# Patient Record
Sex: Female | Born: 1960
Health system: Southern US, Community
[De-identification: ages and names within clinical notes are randomized; demographics above are authoritative.]

## PROBLEM LIST (undated history)

## (undated) DIAGNOSIS — I10 Essential (primary) hypertension: Secondary | ICD-10-CM

## (undated) DIAGNOSIS — E785 Hyperlipidemia, unspecified: Secondary | ICD-10-CM

## (undated) HISTORY — PX: BREAST BIOPSY: SHX20

## (undated) HISTORY — DX: Essential (primary) hypertension: I10

## (undated) HISTORY — DX: Hyperlipidemia, unspecified: E78.5

## (undated) HISTORY — PX: OOPHORECTOMY: SHX86

## (undated) HISTORY — PX: ABDOMINAL HYSTERECTOMY: SHX81

---

## 1998-09-12 ENCOUNTER — Emergency Department (HOSPITAL_COMMUNITY): Admission: EM | Admit: 1998-09-12 | Discharge: 1998-09-12 | Payer: Self-pay | Admitting: Family Medicine

## 1998-09-14 ENCOUNTER — Emergency Department (HOSPITAL_COMMUNITY): Admission: EM | Admit: 1998-09-14 | Discharge: 1998-09-14 | Payer: Self-pay | Admitting: Emergency Medicine

## 2001-07-31 ENCOUNTER — Ambulatory Visit (HOSPITAL_COMMUNITY): Admission: RE | Admit: 2001-07-31 | Discharge: 2001-07-31 | Payer: Self-pay | Admitting: Family Medicine

## 2002-01-21 ENCOUNTER — Encounter: Admission: RE | Admit: 2002-01-21 | Discharge: 2002-01-21 | Payer: Self-pay

## 2002-05-02 ENCOUNTER — Encounter: Admission: RE | Admit: 2002-05-02 | Discharge: 2002-05-02 | Payer: Self-pay

## 2002-05-16 ENCOUNTER — Encounter (INDEPENDENT_AMBULATORY_CARE_PROVIDER_SITE_OTHER): Payer: Self-pay

## 2002-05-17 ENCOUNTER — Inpatient Hospital Stay (HOSPITAL_COMMUNITY): Admission: RE | Admit: 2002-05-17 | Discharge: 2002-05-18 | Payer: Self-pay

## 2002-08-16 ENCOUNTER — Ambulatory Visit (HOSPITAL_COMMUNITY): Admission: RE | Admit: 2002-08-16 | Discharge: 2002-08-16 | Payer: Self-pay

## 2002-12-06 ENCOUNTER — Ambulatory Visit (HOSPITAL_COMMUNITY): Admission: RE | Admit: 2002-12-06 | Discharge: 2002-12-06 | Payer: Self-pay

## 2004-02-18 ENCOUNTER — Emergency Department (HOSPITAL_COMMUNITY): Admission: EM | Admit: 2004-02-18 | Discharge: 2004-02-18 | Payer: Self-pay | Admitting: Emergency Medicine

## 2004-04-01 ENCOUNTER — Ambulatory Visit (HOSPITAL_COMMUNITY): Admission: RE | Admit: 2004-04-01 | Discharge: 2004-04-01 | Payer: Self-pay | Admitting: Obstetrics and Gynecology

## 2004-12-31 ENCOUNTER — Ambulatory Visit: Payer: Self-pay

## 2005-05-02 ENCOUNTER — Ambulatory Visit (HOSPITAL_COMMUNITY): Admission: RE | Admit: 2005-05-02 | Discharge: 2005-05-02 | Payer: Self-pay | Admitting: Obstetrics and Gynecology

## 2005-10-14 ENCOUNTER — Ambulatory Visit: Payer: Self-pay | Admitting: Internal Medicine

## 2006-01-06 ENCOUNTER — Ambulatory Visit: Payer: Self-pay | Admitting: Internal Medicine

## 2006-03-31 ENCOUNTER — Ambulatory Visit: Payer: Self-pay | Admitting: Internal Medicine

## 2006-04-13 ENCOUNTER — Ambulatory Visit: Payer: Self-pay | Admitting: Internal Medicine

## 2006-05-01 ENCOUNTER — Ambulatory Visit (HOSPITAL_COMMUNITY): Admission: RE | Admit: 2006-05-01 | Discharge: 2006-05-01 | Payer: Self-pay | Admitting: Obstetrics and Gynecology

## 2006-07-25 ENCOUNTER — Ambulatory Visit: Payer: Self-pay | Admitting: Internal Medicine

## 2006-09-08 ENCOUNTER — Ambulatory Visit: Payer: Self-pay | Admitting: Internal Medicine

## 2007-01-09 ENCOUNTER — Ambulatory Visit: Payer: Self-pay | Admitting: Internal Medicine

## 2007-01-09 LAB — CONVERTED CEMR LAB
Cholesterol: 173 mg/dL (ref 0–200)
Creatinine,U: 253.7 mg/dL
Hgb A1c MFr Bld: 10.8 % — ABNORMAL HIGH (ref 4.6–6.0)
LDL Cholesterol: 106 mg/dL — ABNORMAL HIGH (ref 0–99)
Potassium: 4 meq/L (ref 3.5–5.1)
Total CHOL/HDL Ratio: 3.6
Triglycerides: 94 mg/dL (ref 0–149)

## 2007-04-20 ENCOUNTER — Ambulatory Visit: Payer: Self-pay | Admitting: Internal Medicine

## 2007-04-24 ENCOUNTER — Encounter (INDEPENDENT_AMBULATORY_CARE_PROVIDER_SITE_OTHER): Payer: Self-pay | Admitting: *Deleted

## 2007-04-24 LAB — CONVERTED CEMR LAB
Cholesterol: 165 mg/dL (ref 0–200)
Creatinine, Ser: 0.6 mg/dL (ref 0.4–1.2)
Creatinine,U: 183.8 mg/dL
HDL: 44.8 mg/dL (ref >39.0)
Microalb Creat Ratio: 4.4 mg/g (ref 0.0–30.0)
Microalb, Ur: 0.8 mg/dL (ref 0.0–1.9)
Total CHOL/HDL Ratio: 3.7

## 2007-05-01 ENCOUNTER — Telehealth: Payer: Self-pay | Admitting: Internal Medicine

## 2007-05-24 ENCOUNTER — Ambulatory Visit (HOSPITAL_COMMUNITY): Admission: RE | Admit: 2007-05-24 | Discharge: 2007-05-24 | Payer: Self-pay | Admitting: Obstetrics and Gynecology

## 2007-05-24 ENCOUNTER — Encounter: Payer: Self-pay | Admitting: Internal Medicine

## 2008-02-27 ENCOUNTER — Ambulatory Visit: Payer: Self-pay | Admitting: Internal Medicine

## 2008-02-27 DIAGNOSIS — E785 Hyperlipidemia, unspecified: Secondary | ICD-10-CM | POA: Insufficient documentation

## 2008-02-27 DIAGNOSIS — E1165 Type 2 diabetes mellitus with hyperglycemia: Secondary | ICD-10-CM | POA: Insufficient documentation

## 2008-02-27 DIAGNOSIS — I10 Essential (primary) hypertension: Secondary | ICD-10-CM | POA: Insufficient documentation

## 2008-02-27 LAB — CONVERTED CEMR LAB: Cholesterol, target level: 200 mg/dL

## 2008-03-15 LAB — CONVERTED CEMR LAB
Creatinine, Ser: 0.5 mg/dL (ref 0.4–1.2)
Creatinine,U: 119.8 mg/dL
Microalb Creat Ratio: 25 mg/g (ref 0.0–30.0)
Microalb, Ur: 3 mg/dL — ABNORMAL HIGH (ref 0.0–1.9)

## 2008-03-17 ENCOUNTER — Encounter (INDEPENDENT_AMBULATORY_CARE_PROVIDER_SITE_OTHER): Payer: Self-pay | Admitting: *Deleted

## 2008-04-29 ENCOUNTER — Encounter: Payer: Self-pay | Admitting: Internal Medicine

## 2008-05-27 ENCOUNTER — Ambulatory Visit (HOSPITAL_COMMUNITY): Admission: RE | Admit: 2008-05-27 | Discharge: 2008-05-27 | Payer: Self-pay | Admitting: Obstetrics and Gynecology

## 2008-07-24 ENCOUNTER — Ambulatory Visit: Payer: Self-pay | Admitting: Internal Medicine

## 2008-07-27 LAB — CONVERTED CEMR LAB
ALT: 12 units/L (ref 0–35)
Alkaline Phosphatase: 56 units/L (ref 39–117)
Cholesterol: 147 mg/dL (ref 0–200)
HDL: 45 mg/dL (ref >39.0)
Microalb Creat Ratio: 4.6 mg/g (ref 0.0–30.0)
Microalb, Ur: 1 mg/dL (ref 0.0–1.9)
Total CHOL/HDL Ratio: 3.3

## 2008-07-28 ENCOUNTER — Encounter (INDEPENDENT_AMBULATORY_CARE_PROVIDER_SITE_OTHER): Payer: Self-pay | Admitting: *Deleted

## 2008-11-26 ENCOUNTER — Ambulatory Visit: Payer: Self-pay | Admitting: Internal Medicine

## 2008-12-01 ENCOUNTER — Ambulatory Visit: Payer: Self-pay | Admitting: Internal Medicine

## 2008-12-04 ENCOUNTER — Telehealth (INDEPENDENT_AMBULATORY_CARE_PROVIDER_SITE_OTHER): Payer: Self-pay | Admitting: *Deleted

## 2009-05-05 ENCOUNTER — Telehealth (INDEPENDENT_AMBULATORY_CARE_PROVIDER_SITE_OTHER): Payer: Self-pay | Admitting: *Deleted

## 2009-05-06 ENCOUNTER — Telehealth (INDEPENDENT_AMBULATORY_CARE_PROVIDER_SITE_OTHER): Payer: Self-pay | Admitting: *Deleted

## 2009-05-25 ENCOUNTER — Ambulatory Visit: Payer: Self-pay | Admitting: Internal Medicine

## 2009-05-31 LAB — CONVERTED CEMR LAB
BUN: 12 mg/dL (ref 6–23)
Hemoglobin: 12.5 g/dL (ref 12.0–15.0)
Hgb A1c MFr Bld: 10 % — ABNORMAL HIGH (ref 4.6–6.5)
Lymphocytes Relative: 39.3 % (ref 12.0–46.0)
Lymphs Abs: 2.1 10*3/uL (ref 0.7–4.0)
MCHC: 32.9 g/dL (ref 30.0–36.0)
MCV: 90.1 fL (ref 78.0–100.0)
Neutro Abs: 2.7 10*3/uL (ref 1.4–7.7)
Neutrophils Relative %: 51.1 % (ref 43.0–77.0)
RBC: 4.2 M/uL (ref 3.87–5.11)

## 2009-06-01 ENCOUNTER — Ambulatory Visit: Payer: Self-pay | Admitting: Internal Medicine

## 2009-06-11 ENCOUNTER — Ambulatory Visit (HOSPITAL_COMMUNITY): Admission: RE | Admit: 2009-06-11 | Discharge: 2009-06-11 | Payer: Self-pay | Admitting: Obstetrics and Gynecology

## 2009-08-17 ENCOUNTER — Ambulatory Visit: Payer: Self-pay | Admitting: Family Medicine

## 2009-09-07 ENCOUNTER — Ambulatory Visit: Payer: Self-pay | Admitting: Internal Medicine

## 2009-09-08 ENCOUNTER — Telehealth (INDEPENDENT_AMBULATORY_CARE_PROVIDER_SITE_OTHER): Payer: Self-pay | Admitting: *Deleted

## 2009-09-08 LAB — CONVERTED CEMR LAB
HDL: 38.2 mg/dL — ABNORMAL LOW (ref >39.00)
Hgb A1c MFr Bld: 10.5 % — ABNORMAL HIGH (ref 4.6–6.5)
LDL Cholesterol: 86 mg/dL (ref 0–99)
Total CHOL/HDL Ratio: 4
Triglycerides: 118 mg/dL (ref 0.0–149.0)
VLDL: 23.6 mg/dL (ref 0.0–40.0)

## 2009-09-09 ENCOUNTER — Ambulatory Visit: Payer: Self-pay | Admitting: Internal Medicine

## 2009-10-05 ENCOUNTER — Ambulatory Visit: Payer: Self-pay | Admitting: Internal Medicine

## 2009-10-07 ENCOUNTER — Telehealth (INDEPENDENT_AMBULATORY_CARE_PROVIDER_SITE_OTHER): Payer: Self-pay | Admitting: *Deleted

## 2009-10-07 ENCOUNTER — Encounter: Payer: Self-pay | Admitting: Internal Medicine

## 2009-10-19 ENCOUNTER — Telehealth (INDEPENDENT_AMBULATORY_CARE_PROVIDER_SITE_OTHER): Payer: Self-pay | Admitting: *Deleted

## 2010-03-22 ENCOUNTER — Telehealth (INDEPENDENT_AMBULATORY_CARE_PROVIDER_SITE_OTHER): Payer: Self-pay | Admitting: *Deleted

## 2010-06-08 ENCOUNTER — Encounter: Payer: Self-pay | Admitting: Internal Medicine

## 2010-06-11 ENCOUNTER — Encounter: Payer: Self-pay | Admitting: Internal Medicine

## 2010-06-15 ENCOUNTER — Ambulatory Visit (HOSPITAL_BASED_OUTPATIENT_CLINIC_OR_DEPARTMENT_OTHER): Admission: RE | Admit: 2010-06-15 | Discharge: 2010-06-15 | Payer: Self-pay | Admitting: Obstetrics and Gynecology

## 2010-06-15 ENCOUNTER — Ambulatory Visit: Payer: Self-pay | Admitting: Radiology

## 2010-06-15 ENCOUNTER — Telehealth (INDEPENDENT_AMBULATORY_CARE_PROVIDER_SITE_OTHER): Payer: Self-pay | Admitting: *Deleted

## 2010-07-02 ENCOUNTER — Ambulatory Visit: Payer: Self-pay | Admitting: Internal Medicine

## 2010-07-02 LAB — CONVERTED CEMR LAB
Cholesterol: 157 mg/dL (ref 0–200)
Creatinine, Ser: 0.7 mg/dL (ref 0.4–1.2)
Hgb A1c MFr Bld: 10.1 % — ABNORMAL HIGH (ref 4.6–6.5)
LDL Cholesterol: 91 mg/dL (ref 0–99)
Microalb Creat Ratio: 0.9 mg/g (ref 0.0–30.0)
Microalb, Ur: 1.2 mg/dL (ref 0.0–1.9)
Potassium: 3.9 meq/L (ref 3.5–5.1)
Total Protein: 6.3 g/dL (ref 6.0–8.3)
VLDL: 23 mg/dL (ref 0.0–40.0)

## 2010-07-07 ENCOUNTER — Ambulatory Visit: Payer: Self-pay | Admitting: Internal Medicine

## 2010-07-08 ENCOUNTER — Encounter (INDEPENDENT_AMBULATORY_CARE_PROVIDER_SITE_OTHER): Payer: Self-pay | Admitting: *Deleted

## 2010-07-28 ENCOUNTER — Telehealth (INDEPENDENT_AMBULATORY_CARE_PROVIDER_SITE_OTHER): Payer: Self-pay | Admitting: *Deleted

## 2010-08-12 ENCOUNTER — Encounter (INDEPENDENT_AMBULATORY_CARE_PROVIDER_SITE_OTHER): Payer: Self-pay | Admitting: *Deleted

## 2010-08-20 ENCOUNTER — Telehealth: Payer: Self-pay | Admitting: Internal Medicine

## 2010-08-25 ENCOUNTER — Telehealth: Payer: Self-pay | Admitting: Internal Medicine

## 2010-09-01 ENCOUNTER — Emergency Department (HOSPITAL_BASED_OUTPATIENT_CLINIC_OR_DEPARTMENT_OTHER): Admission: EM | Admit: 2010-09-01 | Discharge: 2010-09-01 | Payer: Self-pay | Admitting: Emergency Medicine

## 2010-09-01 ENCOUNTER — Ambulatory Visit: Payer: Self-pay | Admitting: Diagnostic Radiology

## 2010-09-15 ENCOUNTER — Telehealth: Payer: Self-pay | Admitting: Internal Medicine

## 2010-09-16 ENCOUNTER — Encounter: Payer: Self-pay | Admitting: Internal Medicine

## 2010-09-16 ENCOUNTER — Telehealth: Payer: Self-pay | Admitting: Internal Medicine

## 2010-11-10 ENCOUNTER — Ambulatory Visit
Admission: RE | Admit: 2010-11-10 | Discharge: 2010-11-10 | Payer: Self-pay | Source: Home / Self Care | Attending: Internal Medicine | Admitting: Internal Medicine

## 2010-11-10 DIAGNOSIS — J019 Acute sinusitis, unspecified: Secondary | ICD-10-CM | POA: Insufficient documentation

## 2010-11-20 ENCOUNTER — Encounter: Payer: Self-pay | Admitting: Obstetrics and Gynecology

## 2010-11-21 ENCOUNTER — Encounter: Payer: Self-pay | Admitting: Obstetrics and Gynecology

## 2010-11-29 ENCOUNTER — Telehealth (INDEPENDENT_AMBULATORY_CARE_PROVIDER_SITE_OTHER): Payer: Self-pay | Admitting: *Deleted

## 2010-11-30 NOTE — Consult Note (Signed)
Summary: [facility]  [facility]   Imported By: Lennie Odor 06/10/2010 11:20:23  _____________________________________________________________________  External Attachment:    Type:   Image     Comment:   External Document

## 2010-11-30 NOTE — Progress Notes (Signed)
----   Converted from flag ---- ---- 06/15/2010 10:41 AM, Okey Regal Spring wrote: mailed letter to patient to schedule lab & ov  ---- 06/14/2010 10:20 AM, Okey Regal Spring wrote:   ---- 06/14/2010 10:20 AM, Okey Regal Spring wrote: lmom to schedule lab & follow up ov  ---- 06/13/2010 9:31 AM, Marga Melnick MD wrote: labs were due in 12/2009. She needs fasting lipids, hepatic panel, A1c , BUN,creat, K+,urine microalbumin with OV 5-7 days later (250.02, 995.20,272.4) ------------------------------

## 2010-11-30 NOTE — Progress Notes (Signed)
Summary: Insulin assistance  Phone Note Call from Patient Call back at Home Phone [phone] Call back at Work Phone [phone]   Summary of Call: I received a vm that the patient was returning our call. Left message on voicemail at both numbers above to call back to office. Lucious Groves CMA  September 16, 2010 4:51 PM  (?How much is the patient taking?)  Left message on machine at home to call back to office. I was able to reach the patient at work and she is taking 22units once daily. Lucious Groves CMA  September 17, 2010 1:32 PM

## 2010-11-30 NOTE — Progress Notes (Signed)
Summary: Pt assistance paperwork  Phone Note Call from Patient   Summary of Call: Patient called noting that she has patient assistance paperwork for Lantus.  She was advised to drop off the paperwork, and we will call her when it is ready. Initial call taken by: Lucious Groves CMA,  September 15, 2010 11:26 AM

## 2010-11-30 NOTE — Progress Notes (Signed)
Summary: pt meeds lab - mailed letter  ---- Converted from flag ---- ---- 06/15/2010 10:41 AM, Okey Regal Spring wrote: mailed letter to patient to schedule lab & ov  ---- 06/14/2010 10:20 AM, Okey Regal Spring wrote:   ---- 06/14/2010 10:20 AM, Okey Regal Spring wrote: lmom to schedule lab & follow up ov  ---- 06/13/2010 9:31 AM, Marga Melnick MD wrote: labs were due in 12/2009. She needs fasting lipids, hepatic panel, A1c , BUN,creat, K+,urine microalbumin with OV 5-7 days later (250.02, 995.20,272.4) ------------------------------

## 2010-11-30 NOTE — Medication Information (Signed)
Summary: Patient Assistance Form/[employer]  Patient Assistance Form/[employer]   Imported By: Lanelle Bal 09/27/2010 13:13:34  _____________________________________________________________________  External Attachment:    Type:   Image     Comment:   External Document

## 2010-11-30 NOTE — Letter (Signed)
Summary: Pre Visit Letter Revised  [facility]  [address] [place], [place] [address]   Phone: [phone]  Fax: [phone]        07/08/2010 MRN: [id number]   Kathleen Acevedo [address] [place], [place]  [address]            Welcome to the Gastroenterology Division at [facility].    You are scheduled to see a nurse for your pre-procedure visit on August 12, 2010 at 9:00am on the 3rd floor at [facility], [address]  We ask that you try to arrive at our office 15 minutes prior to your appointment time to allow for check-in.  Please take a minute to review the attached form.  If you answer "Yes" to one or more of the questions on the first page, we ask that you call the person listed at your earliest opportunity.  If you answer "No" to all of the questions, please complete the rest of the form and bring it to your appointment.    Your nurse visit will consist of discussing your medical and surgical history, your immediate family medical history, and your medications.   If you are unable to list all of your medications on the form, please bring the medication bottles to your appointment and we will list them.  We will need to be aware of both prescribed and over the counter drugs.  We will need to know exact dosage information as well.    Please be prepared to read and sign documents such as consent forms, a financial agreement, and acknowledgement forms.  If necessary, and with your consent, a friend or relative is welcome to sit-in on the nurse visit with you.  Please bring your insurance card so that we may make a copy of it.  If your insurance requires a referral to see a specialist, please bring your referral form from your primary care physician.  No co-pay is required for this nurse visit.     If you cannot keep your appointment, please call [phone] to cancel or reschedule prior to your appointment date.  This allows [place] the opportunity to  schedule an appointment for another patient in need of care.    Thank you for choosing [facility] for your medical needs.  We appreciate the opportunity to care for you.  Please visit [place] at our website  to learn more about our practice.  Sincerely, The Gastroenterology Division

## 2010-11-30 NOTE — Progress Notes (Signed)
Summary: Janumet  Phone Note Call from Patient Call back at Work Phone [phone]   Details for Reason: [facility] [address] [place] Summary of Call: Patient called the office to let MD know that her husband is working now and she can now afford to get the Janumet. Patient would like it sent to the [facility] above.   Is this ok? Please advise. Initial call taken by: Lucious Groves CMA,  August 25, 2010 3:49 PM  Follow-up for Phone Call        Janumet in place of Metformin Follow-up by: Marga Melnick MD,  August 25, 2010 5:02 PM  Additional Follow-up for Phone Call Additional follow up Details #1::        left pt detail message rx sent to pharmacy and to call with any other concerns or questions......Marland KitchenFelecia Deloach CMA  August 26, 2010 8:33 AM     New/Updated Medications: JANUMET 50-1000 MG TABS (SITAGLIPTIN-METFORMIN HCL) 1 two times a day with 2 largest meals( stop Metformin) Prescriptions: JANUMET 50-1000 MG TABS (SITAGLIPTIN-METFORMIN HCL) 1 two times a day with 2 largest meals( stop Metformin)  #60 x 2   Entered and Authorized by:   Marga Melnick MD   Signed by:   Marga Melnick MD on 08/25/2010   Method used:   Electronically to        [facility] # [phone]* (retail)       [address]       [place], [place]  [address]       Ph: [phone]       Fax: [phone]   RxID:   [id number]

## 2010-11-30 NOTE — Progress Notes (Signed)
Summary: Janumet/Glucophage  Phone Note Call from Patient Call back at Home Phone [phone] Call back at Work Phone [phone]   Summary of Call: Patient called the office stating that she needs to back on Glucophage b/c she cannot afford the Janumet. Glucophage was $10 and Janument is $35 and she cannot tell a real difference, but states she likes the Glucophage better. Is it ok to send prescription for Glucophage? Please adivse. Initial call taken by: Lucious Groves CMA,  August 20, 2010 2:36 PM  Follow-up for Phone Call        A1c was 10.1% , a 100% increased heart attack or stroke risk. Glucophage wasn't getting her to goal of < 7%.  If she doesn't want to try Janumet, titrate Lantus by 3 units / day every week until fasting glucose is averaging  150-160. At that point recheck A1c (250.02) Follow-up by: Marga Melnick MD,  August 20, 2010 3:00 PM  Additional Follow-up for Phone Call Additional follow up Details #1::        Patient notified. Additional Follow-up by: Lucious Groves CMA,  August 20, 2010 4:02 PM

## 2010-11-30 NOTE — Consult Note (Signed)
Summary: [facility]  [facility]   Imported By: Lanelle Bal 06/21/2010 10:00:21  _____________________________________________________________________  External Attachment:    Type:   Image     Comment:   External Document

## 2010-11-30 NOTE — Letter (Signed)
Summary: Pre Visit No Show Letter  [facility]  [address] [place], [place] [address]   Phone: [phone]  Fax: [phone]        August 12, 2010 MRN: [id number]    ROSCHELLE CALANDRA [address] [place], [place]  [address]    Dear Ms. Carraway,   We have been unable to reach you by phone concerning the pre-procedure visit that you missed on  !0/13/2011. For this reason,your procedure scheduled on Thursday 08/26/2010 has been cancelled. Our scheduling staff will gladly assist you with rescheduling your appointments at a more convenient time. Please call our office at [phone] between the hours of 8:00am and 5:00pm, press option #2 to reach an appointment scheduler. Please consider updating your contact numbers at this time so that we can reach you by phone in the future with schedule changes or results.    Thank you,    Ezra Sites RN [facility]

## 2010-11-30 NOTE — Medication Information (Signed)
Summary: [facility]  [facility]   Imported By: Lanelle Bal 07/19/2010 10:29:23  _____________________________________________________________________  External Attachment:    Type:   Image     Comment:   External Document  Appended Document: [facility] Refaxed to alternate fax number [phone], we received paperwork from [facility] for a 4th time which we have faxed back each time

## 2010-11-30 NOTE — Medication Information (Signed)
Summary: [facility]  [facility]   Imported By: Lanelle Bal 06/21/2010 09:24:32  _____________________________________________________________________  External Attachment:    Type:   Image     Comment:   External Document

## 2010-11-30 NOTE — Progress Notes (Signed)
Summary: QUESTION --SUBSTITUTE FOR LANTUS OPTICLIK??  Phone Note Refill Request Message from:  Fax from Pharmacy on July 28, 2010 2:30 PM  Refills Requested: Medication #1:  LANTUS OPTICLIK 100 UNIT/ML SOLN use 18 units every morning; increase by 3 units  daily each week until fasting glucose is consistently < 140 [facility] [place],     FAX [phone]   ****NOTE---HANDWRITTEN NOTE STATES:  "DOESNT COME IN THIS BRAND--DO YOU WANT SOLOSTAR? "  Initial call taken by: Jerolyn Shin,  July 28, 2010 2:37 PM    New/Updated Medications: LANTUS SOLOSTAR 100 UNIT/ML SOLN (INSULIN GLARGINE) 18 units every morning; increase by 3 units  daily each week until fasting glucose is consistently < 140 Prescriptions: LANTUS SOLOSTAR 100 UNIT/ML SOLN (INSULIN GLARGINE) 18 units every morning; increase by 3 units  daily each week until fasting glucose is consistently < 140  #54mo x 5   Entered by:   Shonna Chock CMA   Authorized by:   Marga Melnick MD   Signed by:   Shonna Chock CMA on 07/28/2010   Method used:   Electronically to        [facility] # [phone]* (retail)       [address]       [place], [place]  [address]       Ph: [phone]       Fax: [phone]   RxID:   [id number]

## 2010-11-30 NOTE — Assessment & Plan Note (Signed)
Summary: rto review lab/cbs   Vital Signs:  Patient profile:   50 year old female Weight:      231.8 pounds BMI:     35.37 Pulse rate:   64 / minute Resp:     15 per minute BP sitting:   140 / 84  (left arm) Cuff size:   large  Vitals Entered By: Shonna Chock CMA (July 07, 2010 8:09 AM) CC: Follow-up visit: Discuss labs (copy given), Type 2 diabetes mellitus follow-up   Primary Care Provider:  Alfonse Flavors  CC:  Follow-up visit: Discuss labs (copy given) and Type 2 diabetes mellitus follow-up.  History of Present Illness: Hyperlipidemia Follow-Up      This is a 50 year old woman who presents for Hyperlipidemia follow-up.  The patient denies the following symptoms: chest pain/pressure, exercise intolerance, dypsnea, palpitations, syncope, and pedal edema.  Dietary compliance has been good.  The patient reports exercising 4-5 X per week as water aerobics or machines @ gym.  Lipids are @ goal w/o meds. Hypertension Follow-Up      The patient also presents for Hypertension follow-up.  The patient denies lightheadedness, urinary frequency, headaches, edema, rash, and fatigue.  Compliance with medications (by patient report) has been near 100%.  Adjunctive measures currently used by the patient include modified  salt restriction.  BP @ Podiatrist this month was 130/70. Type 2 Diabetes Mellitus Follow-Up      The patient is also here for Type 2 diabetes mellitus follow-up.  The patient reports weight loss of 18# with W W 's, but denies polyuria, polydipsia, blurred vision, and self managed hypoglycemia.  The patient denies the following symptoms: poor wound healing, vision loss, and foot ulcer.  Since the last visit the patient reports good dietary compliance and exercising regularly(see above).  The patient has been measuring capillary blood glucose before breakfast ( 150 as average) and  2 hrs after dinner (180-240).  Since the last visit, the patient reports having had no eye care and foot care  by a podiatrist.  A1c 10.1 % which means average glucose of 240 with > 100% increased risk. Januvia D/Ced due to drop in glucose & cost ( $35/ month). Glucometer is > 5 yrs old.  Current Medications (verified): 1)  Lantus Opticlik 100 Unit/ml Soln (Insulin Glargine) .... Use 18 Units Every Morning 2)  Accu-Chek Aviva   Strp (Glucose Blood) .... Tests 2-3 Times Daily Patient On Insulin 3)  Lisinopril 20 Mg  Tabs (Lisinopril) .... Take One Tablet Daily. 4)  Metformin Hcl 1000 Mg  Tabs (Metformin Hcl) .... Take One Tablet Twice Daily With Two Largest Meals of The Day. ****appointment Due**** 5)  Iron 325 (65 Fe) Mg Tabs (Ferrous Sulfate) .[name].. 1 By Mouth 2 X Weekly 6)  Multivitamins  Tabs (Multiple Vitamin) .[name].. 1 By Mouth Once Daily  Allergies (verified): No Known Drug Allergies  Review of Systems GI:  Denies abdominal pain, bloody stools, dark tarry stools, and indigestion; Colonoscopy due; SOC discussed.  Physical Exam  General:  well-nourished; alert,appropriate and cooperative throughout examination Neck:  No deformities, masses, or tenderness noted. Lungs:  Normal respiratory effort, chest expands symmetrically. Lungs are clear to auscultation, no crackles or wheezes. Heart:  normal rate, regular rhythm, no gallop, no rub, no JVD, no HJR, and grade 1/6 systolic murmur.   Abdomen:  Bowel sounds positive,abdomen soft and non-tender without masses, organomegaly or hernias noted. Pulses:  R and L carotid,radial,dorsalis pedis and posterior tibial pulses are full  and equal bilaterally Extremities:  No clubbing, cyanosis, edema.Mild nail  changes .Some Hammer Toe deformity & callus of 5th toes Neurologic:  alert & oriented X3 and DTRs symmetrical and normal.   Skin:  Intact without suspicious lesions or rashes   Impression & Recommendations:  Problem # 1:  DIABETES MELLITUS, TYPE II, UNCONTROLLED (ICD-250.02)  The following medications were removed from the medication list:    Januvia  100 Mg Tabs (Sitagliptin phosphate) .[name]... 1 once daily Her updated medication list for this problem includes:    Lantus Opticlik 100 Unit/ml Soln (Insulin glargine) ..... Use 18 units every morning; increase by 3 units  daily each week until fasting glucose is consistently < 140    Lisinopril 20 Mg Tabs (Lisinopril) .[name]... Take one tablet daily.    Metformin Hcl 1000 Mg Tabs (Metformin hcl) .[name]... Take one tablet twice daily with two largest meals of the day. change to janumet once completed    Janumet 50-1000 Mg Tabs (Sitagliptin-metformin hcl) .[name]... 1 two times a day with 2 largest meals  Problem # 2:  HYPERTENSION (ICD-401.9) Controlled by  Her updated medication list for this problem includes:    Lisinopril 20 Mg Tabs (Lisinopril) .[name]... Take one tablet daily.  Problem # 3:  HYPERLIPIDEMIA (ICD-272.4) Lipids @ goal  Problem # 4:  SCREENING, COLON CANCER (ICD-V76.51)  Orders: Gastroenterology Referral (GI)  Complete Medication List: 1)  Lantus Opticlik 100 Unit/ml Soln (Insulin glargine) .... Use 18 units every morning; increase by 3 units  daily each week until fasting glucose is consistently < 140 2)  Onetouch Delica Lancets Misc (Lancets) .... Check blooddsugar two times a day 3)  Lisinopril 20 Mg Tabs (Lisinopril) .... Take one tablet daily. 4)  Metformin Hcl 1000 Mg Tabs (Metformin hcl) .... Take one tablet twice daily with two largest meals of the day. change to janumet once completed 5)  Iron 325 (65 Fe) Mg Tabs (Ferrous sulfate) .[name].. 1 by mouth 2 x weekly 6)  Multivitamins Tabs (Multiple vitamin) .[name].. 1 by mouth once daily 7)  Janumet 50-1000 Mg Tabs (Sitagliptin-metformin hcl) .[name].. 1 two times a day with 2 largest meals 8)  Onetouch Ultra Blue Strp (Glucose blood) .... Check bloodsugars two times a day  Patient Instructions: 1)  Please schedule a follow-up appointment in 3 months. 2)  HbgA1C prior to visit, ICD-9:250.02. Consume < 30 grams of HFCS sugar / day. 3)  Check your  blood sugars regularly.Fasting goal = 90-140 & 2 hrs after a meal < 180, ideally < 160. 4)  Check your Blood Pressure regularly.Your goal = AVERAGE < 135/85. Prescriptions: ONETOUCH ULTRA BLUE  STRP (GLUCOSE BLOOD) check bloodsugars two times a day  #200 x 3   Entered by:   Shonna Chock CMA   Authorized by:   Marga Melnick MD   Signed by:   Shonna Chock CMA on 07/07/2010   Method used:   Electronically to        [facility] # [phone]* (retail)       [address]       [place], [place]  [address]       Ph: [phone]       Fax: [phone]   RxID:   [id number] Dola Argyle LANCETS  MISC (LANCETS) check blooddsugar two times a day  #200 x 3   Entered by:   Shonna Chock CMA   Authorized by:  Marga Melnick MD   Signed by:   Shonna Chock CMA on 07/07/2010   Method used:   Electronically to        [facility] # [phone]* (retail)       [address]       [place], [place]  [address]       Ph: [phone]       Fax: [phone]   RxID:   [id number] LISINOPRIL 20 MG  TABS (LISINOPRIL) Take one tablet daily.  #90 Each x 1   Entered and Authorized by:   Marga Melnick MD   Signed by:   Marga Melnick MD on 07/07/2010   Method used:   Print then Give to Patient   RxID:   [id number] LANTUS OPTICLIK 100 UNIT/ML SOLN (INSULIN GLARGINE) use 18 units every morning; increase by 3 units  daily each week until fasting glucose is consistently < 140  #1 month x 5   Entered and Authorized by:   Marga Melnick MD   Signed by:   Marga Melnick MD on 07/07/2010   Method used:   Print then Give to Patient   RxID:   [id number] JANUMET 50-1000 MG TABS (SITAGLIPTIN-METFORMIN HCL) 1 two times a day with 2 largest meals  #60 x 0   Entered and Authorized by:   Marga Melnick MD   Signed by:   Marga Melnick MD on 07/07/2010   Method used:   Print then Give to Patient   RxID:   [id number]

## 2010-11-30 NOTE — Progress Notes (Signed)
Summary: Refill Request  Phone Note Refill Request Message from:  Pharmacy on Mar 22, 2010 1:38 PM  Refills Requested: Medication #1:  METFORMIN HCL 1000 MG  TABS Take one tablet twice daily with two largest meals of the day.   Dosage confirmed as above?Dosage Confirmed   Supply Requested: 1 month   Last Refilled: 03/09/2010 [facility] [address]  Initial call taken by: Harold Barban,  Mar 22, 2010 1:39 PM    New/Updated Medications: METFORMIN HCL 1000 MG  TABS (METFORMIN HCL) Take one tablet twice daily with two largest meals of the day. APPOINTMENT DUE Prescriptions: METFORMIN HCL 1000 MG  TABS (METFORMIN HCL) Take one tablet twice daily with two largest meals of the day. APPOINTMENT DUE  #180 x 0   Entered by:   Shonna Chock   Authorized by:   Marga Melnick MD   Signed by:   Shonna Chock on 03/22/2010   Method used:   Electronically to        [facility] # [phone]* (retail)       [address]       [place], [place]  [address]       Ph: [phone]       Fax: [phone]   RxID:   [id number]  3)  HbgA1C prior to visit, ICD-9:250.02 4)  Urine Microalbumin prior to visit,  5)  BUn,creat,K+ 401.9

## 2010-12-02 NOTE — Assessment & Plan Note (Signed)
Summary: SORE THROAT, COUGH/RH.......[name]   Vital Signs:  Patient profile:   50 year old female Weight:      248 pounds BMI:     37.84 Temp:     97.8 degrees F oral BP sitting:   150 / 90  (left arm)  Vitals Entered By: Doristine Devoid CMA (November 10, 2010 11:46 AM) CC: sinus congestion and cough   History of Present Illness: 50 yo woman here today for ? sinus infxn.  + facial pain and pressure, nasal congestion, drainage, cough.  sxs started 2 weeks ago and then initially improved before worsening.  no fevers.  no ear pain.  + sick contacts.  Current Medications (verified): 1)  Lantus Solostar 100 Unit/ml Soln (Insulin Glargine) .[name].. 18 Units Every Morning; Increase By 3 Units  Daily Each Week Until Fasting Glucose Is Consistently < 140 2)  Onetouch Delica Lancets  Misc (Lancets) .... Check Blooddsugar Two Times A Day 3)  Lisinopril 20 Mg  Tabs (Lisinopril) .... Take One Tablet Daily. 4)  Iron 325 (65 Fe) Mg Tabs (Ferrous Sulfate) .[name].. 1 By Mouth 2 X Weekly 5)  Multivitamins  Tabs (Multiple Vitamin) .[name].. 1 By Mouth Once Daily 6)  Onetouch Ultra Blue  Strp (Glucose Blood) .... Check Bloodsugars Two Times A Day 7)  Janumet 50-1000 Mg Tabs (Sitagliptin-Metformin Hcl) .[name].. 1 Two Times A Day With 2 Largest Meals( Stop Metformin)**appointment Due**  Allergies (verified): No Known Drug Allergies  Review of Systems      See HPI  Physical Exam  General:  well-nourished; alert,appropriate and cooperative throughout examination Head:  NCAT, + TTP over frontal and maxillary sinuses Eyes:  no injxn or inflammation Ears:  External ear exam shows no significant lesions or deformities.  Otoscopic examination reveals clear canals, tympanic membranes are intact bilaterally without bulging, retraction, inflammation or discharge. Hearing is grossly normal bilaterally. Nose:  + congestion Mouth:  Oral mucosa and oropharynx without lesions or exudates.  Neck:  ant LAD Lungs:  Normal respiratory  effort, chest expands symmetrically. Lungs are clear to auscultation, no crackles or wheezes. Heart:  reg S1/S2   Impression & Recommendations:  Problem # 1:  SINUSITIS - ACUTE-NOS (ICD-461.9) Assessment New start Augmentin given DM.  cough meds as needed.  reviewed supportive care and red flags that should prompt return.  Pt expresses understanding and is in agreement w/ this plan. Her updated medication list for this problem includes:    Augmentin 875-125 Mg Tabs (Amoxicillin-pot clavulanate) .[name]... 1 by mouth 2 times daily x10 days.  take w/ food.    Tessalon 200 Mg Caps (Benzonatate) .[name]... Take one capsule by mouth three times a day as needed for cough    Cheratussin Ac 100-10 Mg/72ml Syrp (Guaifenesin-codeine) .[name]... 1-2 tsps q4-6 as needed for cough.  will cause drowsiness  Complete Medication List: 1)  Lantus Solostar 100 Unit/ml Soln (Insulin glargine) .[name].. 18 units every morning; increase by 3 units  daily each week until fasting glucose is consistently < 140 2)  Onetouch Delica Lancets Misc (Lancets) .... Check blooddsugar two times a day 3)  Lisinopril 20 Mg Tabs (Lisinopril) .... Take one tablet daily. 4)  Iron 325 (65 Fe) Mg Tabs (Ferrous sulfate) .[name].. 1 by mouth 2 x weekly 5)  Multivitamins Tabs (Multiple vitamin) .[name].. 1 by mouth once daily 6)  Onetouch Ultra Blue Strp (Glucose blood) .... Check bloodsugars two times a day 7)  Janumet 50-1000 Mg Tabs (Sitagliptin-metformin hcl) .[name].. 1 two times a day  with 2 largest meals( stop metformin)**appointment due** 8)  Augmentin 875-125 Mg Tabs (Amoxicillin-pot clavulanate) .[name].. 1 by mouth 2 times daily x10 days.  take w/ food. 9)  Tessalon 200 Mg Caps (Benzonatate) .... Take one capsule by mouth three times a day as needed for cough 10)  Cheratussin Ac 100-10 Mg/9ml Syrp (Guaifenesin-codeine) .[name].. 1-2 tsps q4-6 as needed for cough.  will cause drowsiness  Patient Instructions: 1)  You have a sinus infection 2)  Take the Augmentin as  directed for the infection 3)  Use the Tessalon for daytime cough and the syrup for night and weekend cough 4)  Drink plenty of fluids! 5)  REST! 6)  Tylenol or ibuprofen for the body aches or fevers 7)  Call with any questions or concerns 8)  Hang in there!!! Prescriptions: CHERATUSSIN AC 100-10 MG/5ML SYRP (GUAIFENESIN-CODEINE) 1-2 tsps Q4-6 as needed for cough.  will cause drowsiness  #155ml x 0   Entered and Authorized by:   Neena Rhymes MD   Signed by:   Neena Rhymes MD on 11/10/2010   Method used:   Print then Give to Patient   RxID:   [id number] TESSALON 200 MG CAPS (BENZONATATE) Take one capsule by mouth three times a day as needed for cough  #60 x 0   Entered and Authorized by:   Neena Rhymes MD   Signed by:   Neena Rhymes MD on 11/10/2010   Method used:   Electronically to        [facility] # [phone]* (retail)       [address]       [place], [place]  [address]       Ph: [phone]       Fax: [phone]   RxID:   [id number] AUGMENTIN 875-125 MG TABS (AMOXICILLIN-POT CLAVULANATE) 1 by mouth 2 times daily x10 days.  take w/ food.  #20 x 0   Entered and Authorized by:   Neena Rhymes MD   Signed by:   Neena Rhymes MD on 11/10/2010   Method used:   Electronically to        [facility] # [phone]* (retail)       [address]       [place], [place]  [address]       Ph: [phone]       Fax: [phone]   RxID:   [id number]    Orders Added: 1)  Est. Patient Level III [20254]

## 2010-12-03 ENCOUNTER — Telehealth (INDEPENDENT_AMBULATORY_CARE_PROVIDER_SITE_OTHER): Payer: Self-pay | Admitting: *Deleted

## 2010-12-08 NOTE — Progress Notes (Signed)
Summary: ok to change from [facility] to [facility]  ---- Converted from flag ---- ---- 12/02/2010 4:36 PM, Neena Rhymes MD wrote: if she would like to switch she needs to know that i see my pt's w/ diabetes every 3 months which will be more frequently than she is used to.  she needs to be ok w/ that prior to switching.  ---- 12/02/2010 4:29 PM, Jerolyn Shin wrote: Patient called--she would like to change from Dr Alwyn Ren to Dr Beverely Low----  Is this all right with both ?   Please let me know.   Thanks ------------------------------       Additional Follow-up for Phone Call Additional follow up Details #2::    Spoke to patient--advised her that Dr Beverely Low would be seeing her every three months for her diabetes and she said fine---has set up appt for 4/9 to see Dr Beverely Low Follow-up by: Jerolyn Shin,  December 03, 2010 9:45 AM

## 2010-12-08 NOTE — Progress Notes (Signed)
Summary: ok from [facility] to change from [facility] to [facility]   ---- Converted from flag ---- ---- 12/02/2010 5:39 PM, Marga Melnick MD wrote: OK with me. [facility]  ---- 12/02/2010 4:29 PM, Jerolyn Shin wrote: Patient called--she would like to change from Dr Alwyn Ren to Dr Beverely Low----  Is this all right with both ?   Please let me know.   Thanks ------------------------------       Additional Follow-up for Phone Call Additional follow up Details #2::    got ok from Dr Beverely Low too, see other phone note Follow-up by: Jerolyn Shin,  December 03, 2010 9:46 AM

## 2010-12-08 NOTE — Progress Notes (Signed)
Summary: Refill Request  Phone Note Refill Request Call back at [phone] Message from:  Pharmacy on November 29, 2010 3:17 PM  Refills Requested: Medication #1:  JANUMET 50-1000 MG TABS 1 two times a day with 2 largest meals( stop Metformin)**APPOINTMENT DUE**   Dosage confirmed as above?Dosage Confirmed   Supply Requested: 60   Last Refilled: 11/02/2010 [facility] [address]   Next Appointment Scheduled: none Initial call taken by: Harold Barban,  November 29, 2010 3:18 PM    Prescriptions: JANUMET 50-1000 MG TABS (SITAGLIPTIN-METFORMIN HCL) 1 two times a day with 2 largest meals( stop Metformin)**APPOINTMENT DUE**  #60 x 0   Entered by:   Shonna Chock CMA   Authorized by:   Marga Melnick MD   Signed by:   Shonna Chock CMA on 11/29/2010   Method used:   Electronically to        [facility] # [phone]* (retail)       [address]       [place], [place]  [address]       Ph: [phone]       Fax: [phone]   RxID:   [id number]

## 2010-12-20 ENCOUNTER — Telehealth: Payer: Self-pay | Admitting: Family Medicine

## 2010-12-28 NOTE — Progress Notes (Signed)
Summary: cough return  Phone Note Refill Request Call back at Work Phone [phone] Message from:  Kathleen Acevedo  Refills Requested: Medication #1:  CHERATUSSIN AC 100-10 MG/5ML SYRP 1-2 tsps Q4-6 as needed for cough.  will cause drowsiness. Pt c/o cough again would like to have refill of cough syrup. Pt last seen on 11-10-10 for this. Pt advise OV may be necessary.  [facility] [address].Marland KitchenMarland KitchenFelecia Deloach CMA  December 20, 2010 9:27 AM    Follow-up for Phone Call        can have refill on cough syrup if cough is only symptom.  if she has any other concerns will need OV Follow-up by: Neena Rhymes MD,  December 20, 2010 9:31 AM  Additional Follow-up for Phone Call Additional follow up Details #1::        Pt aware will come in for OV if condition worsen....Marland KitchenMarland KitchenFelecia Deloach CMA  December 20, 2010 9:54 AM     Prescriptions: CHERATUSSIN AC 100-10 MG/5ML SYRP (GUAIFENESIN-CODEINE) 1-2 tsps Q4-6 as needed for cough.  will cause drowsiness  #165ml x 0   Entered by:   Jeremy Johann CMA   Authorized by:   Neena Rhymes MD   Signed by:   Jeremy Johann CMA on 12/20/2010   Method used:   Printed then faxed to ...       [facility]  [address] # [phone]* (retail)       [phone]       [place], [place]  [address]       Ph: [phone]       Fax: [phone]   RxID:   [id number]

## 2011-01-06 ENCOUNTER — Encounter: Payer: Self-pay | Admitting: Family Medicine

## 2011-01-11 LAB — DIFFERENTIAL
Basophils Relative: 3 % — ABNORMAL HIGH (ref 0–1)
Eosinophils Absolute: 0.1 10*3/uL (ref 0.0–0.7)
Monocytes Relative: 6 % (ref 3–12)
Neutro Abs: 3 10*3/uL (ref 1.7–7.7)
Neutrophils Relative %: 58 % (ref 43–77)

## 2011-01-11 LAB — BASIC METABOLIC PANEL
Chloride: 102 mEq/L (ref 96–112)
GFR calc non Af Amer: >60 mL/min (ref >60)
Sodium: 140 mEq/L (ref 135–145)

## 2011-01-11 LAB — CBC
HCT: 38.6 % (ref 36.0–46.0)
MCH: 29.5 pg (ref 26.0–34.0)
MCHC: 33.4 g/dL (ref 30.0–36.0)
MCV: 88.4 fL (ref 78.0–100.0)
Platelets: 207 10*3/uL (ref 150–400)
RDW: 12.2 % (ref 11.5–15.5)
WBC: 5.2 10*3/uL (ref 4.0–10.5)

## 2011-01-14 ENCOUNTER — Telehealth: Payer: Self-pay | Admitting: Family Medicine

## 2011-01-18 NOTE — Progress Notes (Signed)
Summary: Janumet refill  Phone Note Refill Request Message from:  Fax from Pharmacy on January 14, 2011 9:03 AM  Refills Requested: Medication #1:  JANUMET 50-1000 MG TABS 1 two times a day with 2 largest meals( stop Metformin)**[id number]  [address] [place], [place]   phone - [phone],  fax - [phone]   qty = 60  Next Appointment Scheduled: Mon 4/2   Aiyonna Lucado Initial call taken by: Jerolyn Shin,  January 14, 2011 9:05 AM  Follow-up for Phone Call        I see patient has upcoming appt, sent refill. Lucious Groves CMA  January 14, 2011 10:21 AM     Prescriptions: JANUMET 50-1000 MG TABS (SITAGLIPTIN-METFORMIN HCL) 1 two times a day with 2 largest meals( stop Metformin)**APPOINTMENT DUE**  #60 x 0   Entered by:   Lucious Groves CMA   Authorized by:   Neena Rhymes MD   Signed by:   Lucious Groves CMA on 01/14/2011   Method used:   Electronically to        [facility] # [phone]* (retail)       [address]       [place], [place]  [address]       Ph: [phone]       Fax: [phone]   RxID:   [id number]

## 2011-01-31 ENCOUNTER — Ambulatory Visit (INDEPENDENT_AMBULATORY_CARE_PROVIDER_SITE_OTHER): Payer: BC Managed Care – PPO | Admitting: Family Medicine

## 2011-01-31 ENCOUNTER — Encounter: Payer: Self-pay | Admitting: Family Medicine

## 2011-01-31 DIAGNOSIS — IMO0001 Reserved for inherently not codable concepts without codable children: Secondary | ICD-10-CM

## 2011-01-31 DIAGNOSIS — E785 Hyperlipidemia, unspecified: Secondary | ICD-10-CM

## 2011-01-31 DIAGNOSIS — I10 Essential (primary) hypertension: Secondary | ICD-10-CM

## 2011-01-31 LAB — HEPATIC FUNCTION PANEL
ALT: 16 U/L (ref 0–35)
AST: 15 U/L (ref 0–37)
Albumin: 3.5 g/dL (ref 3.5–5.2)
Alkaline Phosphatase: 65 U/L (ref 39–117)
Total Protein: 6.5 g/dL (ref 6.0–8.3)

## 2011-01-31 LAB — LIPID PANEL: Total CHOL/HDL Ratio: 4

## 2011-01-31 LAB — CBC WITH DIFFERENTIAL/PLATELET
Eosinophils Relative: 2.1 % (ref 0.0–5.0)
HCT: 36.6 % (ref 36.0–46.0)
Lymphocytes Relative: 35.1 % (ref 12.0–46.0)
Lymphs Abs: 1.7 10*3/uL (ref 0.7–4.0)
MCHC: 33.7 g/dL (ref 30.0–36.0)
Monocytes Relative: 6.9 % (ref 3.0–12.0)
Neutrophils Relative %: 55.5 % (ref 43.0–77.0)
RBC: 4.17 Mil/uL (ref 3.87–5.11)
RDW: 14.1 % (ref 11.5–14.6)

## 2011-01-31 LAB — BASIC METABOLIC PANEL
BUN: 11 mg/dL (ref 6–23)
CO2: 30 mEq/L (ref 19–32)
Chloride: 96 mEq/L (ref 96–112)
Creatinine, Ser: 0.6 mg/dL (ref 0.4–1.2)
GFR: 143.51 mL/min (ref >60.00)
Glucose, Bld: 233 mg/dL — ABNORMAL HIGH (ref 70–99)

## 2011-01-31 LAB — HEMOGLOBIN A1C: Hgb A1c MFr Bld: 11.4 % — ABNORMAL HIGH (ref 4.6–6.5)

## 2011-01-31 MED ORDER — LISINOPRIL-HYDROCHLOROTHIAZIDE 20-12.5 MG PO TABS: 1.0000 | ORAL_TABLET | Freq: Every day | ORAL | Status: DC

## 2011-01-31 MED ORDER — INSULIN GLARGINE 100 UNIT/ML ~~LOC~~ SOLN: 25.0000 [IU] | Freq: Every day | SUBCUTANEOUS | Status: DC

## 2011-01-31 MED ORDER — METFORMIN HCL 1000 MG PO TABS: 1000.0000 mg | ORAL_TABLET | Freq: Two times a day (BID) | ORAL | Status: DC

## 2011-01-31 NOTE — Progress Notes (Signed)
  Subjective:    Patient ID: Kathleen Acevedo, female    DOB: 13-Jun-1961, 50 y.o.   MRN: [id number]  HPI HTN- BP not at goal.  Took meds 20 min ago.  Does not check BPs at home.  Reports BP checks at other doctor's usually run 140-150/80-90s.  No CP, SOB, visual changes, HAs, edema.  DM- checking CBGs at home, 'but not regularly'.  Fasting CBGs 180-200.  Sugars will spike during week of cycle and w/ fried foods.  Taking 22 units of Lantus qAM and Janumet.  Started Janumet 1 year ago.  Doesn't like the way the Janumet 'makes me feel'.  Will have infrequent lows.  UTD on eye exam.  Denies numbness of tingling of hands/feet.  Hyperlipidemia- not currently on statin.  Has never been despite this being on problem list.  Fasting today.   Review of Systems For ROS see HPI     Objective:   Physical Exam  Constitutional: She is oriented to person, place, and time. She appears well-developed and well-nourished. No distress.  HENT:  Head: Normocephalic and atraumatic.  Eyes: EOM are normal. Pupils are equal, round, and reactive to light.       Eyes injected bilaterally due to seasonal allergies  Neck: Normal range of motion. Neck supple. No thyromegaly present.  Cardiovascular: Normal rate, regular rhythm, normal heart sounds and intact distal pulses.        ? I/VI SEM  Pulmonary/Chest: Effort normal and breath sounds normal. No respiratory distress. She has no wheezes.  Abdominal: Soft. Bowel sounds are normal. She exhibits no distension. There is no tenderness.  Musculoskeletal: She exhibits no edema.  Lymphadenopathy:    She has no cervical adenopathy.  Neurological: She is alert and oriented to person, place, and time.  Skin: Skin is warm and dry.  Psychiatric: She has a normal mood and affect. Her behavior is normal. Judgment and thought content normal.          Assessment & Plan:

## 2011-01-31 NOTE — Patient Instructions (Signed)
Follow up in 1 month to recheck sugars and discuss meds STOP the Lisinopril START the Lisinopril- HCTZ combo daily SWITCH the Lantus to night and INCREASE to 25 units STOP the Janumet START the Metformin twice daily ADD the Onglyza daily in the morning We'll call you with your nutrition appt We'll notify you of your lab results Call with any questions or concerns We'll do this together!

## 2011-01-31 NOTE — Assessment & Plan Note (Signed)
Pt has this listed as a current problem but has never been on meds.  Check labs today.  LDL goal is <70 given DM.  Start meds prn.

## 2011-01-31 NOTE — Assessment & Plan Note (Signed)
Pt's BP not at goal.  Will add HCTZ to current dose of Lisinopril.  Currently asymptomatic.  Will follow closely.

## 2011-01-31 NOTE — Assessment & Plan Note (Signed)
Per old records pt's DM not well controlled.   Discussed the importance of regular visits, healthy diet, regular exercise.  Pt ready to make changes.  Will stop Janumet as pt doesn't like the way it makes her feel.  Will start Onglyza daily.  Restart Metformin.  Switch Lantus to night and increase to 25 units.  UTD on eye exam.  Will re-refer to nutrition for refresher info.  Check labs.

## 2011-02-03 ENCOUNTER — Ambulatory Visit: Payer: Self-pay | Admitting: Family Medicine

## 2011-02-07 ENCOUNTER — Ambulatory Visit: Payer: Self-pay | Admitting: Family Medicine

## 2011-02-18 ENCOUNTER — Other Ambulatory Visit: Payer: Self-pay | Admitting: *Deleted

## 2011-02-18 MED ORDER — SAXAGLIPTIN HCL 5 MG PO TABS: 5.0000 mg | ORAL_TABLET | Freq: Every day | ORAL | Status: DC

## 2011-02-18 NOTE — Telephone Encounter (Signed)
Pt given samples and now needs Rx sent in to pharmacy. Pt note no reaction to med.

## 2011-02-18 NOTE — Telephone Encounter (Signed)
She started this on 4/2- ok for #30, 3 refills

## 2011-02-18 NOTE — Telephone Encounter (Signed)
Appears to have been d/c on 4/2, correct?

## 2011-02-18 NOTE — Telephone Encounter (Signed)
Done

## 2011-03-15 NOTE — Assessment & Plan Note (Signed)
[facility]                                 ON-CALL NOTE   DOLL, FRAZEE                          MRN:          [id number]  DATE:04/05/2007                            DOB:          07/20/1961    TIME OF CALL:  7:20 p.m.   PHONE NUMBER:  [phone].   OBJECTIVE:  The patient needs medication, she is out of insulin. This is  the second time she called today and it was supposed to be called in but  it has not been. She takes 18 units a day.   ASSESSMENT:  Diabetes mellitus.   PLAN:  Lantus insulin was called in to [facility] at [phone] for 18 units  a day, one vial and no refill.   PRIMARY CARE PHYSICIAN:  Dr. Alwyn Ren.   HOME OFFICE:  [facility].     Arta Silence, MD  Electronically Signed    RNS/MedQ  DD: 04/05/2007  DT: 04/06/2007  Job #: [id number]

## 2011-03-18 NOTE — Op Note (Signed)
[facility]  Patient:    Kathleen Acevedo, SHENOY Visit Number: [id number] MRN: [id number]          Service Type: DSU Location: 9300 [id number] Attending Physician:  Barbaraann Cao Dictated by:   Ronda Fairly. Galen Daft, M.D. Proc. 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  POSTOPERATIVE DIAGNOSIS:      1. Right ovarian neoplasm, uncertain behavior,                                  suspected dermoid.                               2. Several subserosal uterine fibroids.                               3. Right hydrosalpinx.                               4. Intestinal adhesions.                               5. Incarcerated supraumbilical hernia.  OPERATION:                    1. Diagnostic laparoscopy converted to                                  exploratory laparotomy.                               2. Right salpingo-oophorectomy.                               3. Lysis of adhesions.                               4. Repair of incarcerated umbilical hernia.  SURGEON:                      Ronda Fairly. Galen Daft, M.D.  ANESTHESIA:                   General.  COMPLICATIONS:                None.  ESTIMATED BLOOD LOSS:         Less than 400 cc.  SPECIMENS:                    Right tube and ovary.  DESCRIPTION OF PROCEDURE:     The patient was identified  as Kathleen Acevedo.  She and I had discussed the procedure ahead of time and laterality of the procedure.  She was brought to the operating room.  The patient was anesthetized.  Preoperative antibiotics were given and Betadine prep, sterile technique, and draping.  The first part of the procedure was the laparoscopy, actually thinking that operative laparoscopy could be feasible for this adnexal mass.  Laparoscopy was carried out as  follows.  There was with her examination under anesthesia a bulging above the umbilical area suggestive of umbilical hernia.  Therefore, the incision for this laparoscopy was made below the umbilical site, approximately 1 cm below. A 5 mm incision for the Veress needle and the trocar.  Carbon dioxide was utilized after care was taken to ascertain correct placement of the needle.  The abdomen was inflated to 3 liters, and the hernia was much more evident with the pneumoperitoneum.  The trocar was placed.  It was atraumatic.  Evaluation of the upper abdomen revealed that there was a hernia of the supraumbilical site with omentum adherent up into the defect.  The upper abdomen otherwise was free of adhesions.  In the lower abdomen, the right ovary was adherent substantially to the sidewall and to the sigmoid colon.  It was not able to be freed substantially to allow for removal laparoscopically.  Care was taken to avoid injury to structures during this process.  The left tube and ovary appeared unremarkable.  The uterus had several small subserosal fibroids that were noted.  Because the procedure could not be completed laparoscopically safely, the patient was converted to open laparotomy.  Pfannenstiel incision was utilized after the other sites were closed at the skin level.  The sites that were utilized for laparoscopy were three sites total.  There was the infraumbilical site, which was a 5 mm site closed with Monocryl; another right-sided 5 mm site and a 12 mm flank site on the left side.  The 12 mm left-sided site was closed both at the skin and in the fascia level from the inside with 0 Vicryl.  The Pfannenstiel incision was carried out without difficulty, the abdomen entered at a point clear of containing structures.  The bowel was packed out of the way, and the self-retaining retractor was utilized.  The right tube was grasped with a Babcock.  The lateral leaf of the peritoneum in  this area was opened up and the medial side retracted.  The ureter was identified, noted to be peristalsing separate and distinct from the adjacent structures.  The ovary was freed from its adhesions to the sigmoid colon using a combination of sharp and blunt dissection.  The sigmoid colon was neither entered nor injured during this process.  The ureter was separate and distinct during this process as well.  The right infundibulopelvic ligament was identified separate and distinct from the ureter, clamped, ligated, and divided.  It was ligated with three separate suture ligatures.  The utero-ovarian ligament was similarly clamped and ligated with two sutures.  There was complete hemostasis noted at each of these sites.  Again, care was taken to avoid adjacent structures.  The tube and ovary were passed off as specimen for pathology.  Again, inspection of the left side was unremarkable.  The uterus had several fibroids noted. There was no active bleeding present.  The next part of the procedure involved repair of the umbilical hernia because there was incarceration of omentum into this hernia.  This omentum was unable to be removed  without dissection, and this was taken care of carefully using a combination of cautery and sharp dissection.  The omentum was then freed.  It was not actively bleeding.  The defect was closed with #1 Prolene suture in a running fashion.  There was complete hemostasis noted, and the defect was reinspected and found to be closed completely without further defect.  The remainder of the fascia was intact.  There was no active bleeding anywhere from that site or other surgical sites.  Each vascular structure was inspected.  The instrument, sponge, and needle counts were correct as the abdomen was closed in layers using two interrupted sutures at the musculoperitoneal layer with Vicryl followed by #1 Vicryl for the fascia. Irrigation was carried out at these points.   There was no active bleeding, and all instrument, sponge, and needle counts were correct after the skin was closed with staple closure after closure of the fascia.  The patient tolerated  the procedure quite well and left the operating room in stable condition with no complications. Dictated by:   Ronda Fairly. Galen Daft, M.D. Attending Physician:  Barbaraann Cao DD:  05/16/02 TD:  05/21/02 Job: [id number]

## 2011-03-18 NOTE — Discharge Summary (Signed)
[facility]  Patient:    Kathleen Acevedo, Kathleen Acevedo Visit Number: [id number] MRN: [id number]          Service Type: DSU Location: 9300 [id number] Attending Physician:  Barbaraann Cao Dictated by:   Ronda Fairly. Galen Daft, M.D. Admit Date:  05/16/2002 Discharge Date: 05/18/2002                             Discharge Summary  ADMISSION DIAGNOSIS:          Right ovarian mass and right lower quadrant pain.  DISCHARGE DIAGNOSIS:          Right ovarian mass and right lower quadrant pain.  SECONDARY DIAGNOSES:          Incarcerated supraumbilical hernia and right ovarian adhesions with endometriosis.  PRINCIPAL PROCEDURE:          Exploratory laparotomy with right salpingo-oophorectomy.  SECONDARY PROCEDURE:          Repair of incarcerated hernia and diagnostic laparoscopy.  COMPLICATIONS:                None.  HOSPITAL COURSE:              The patient was admitted on May 16, 2002.  The findings at surgery were consistent with an incarcerated umbilical hernia with omentum trapped in the umbilical site.  The incision utilized for diagnostic laparoscopy was inferior to this because of concern of hernia there, and the right ovary was fixed to the pelvic sidewall and attached to the bowel and adjacent structures.  Therefore, a laparotomy was the principal means for completing the surgery.  After laparotomy was completed, the patient did well in the postoperative period with normal return of bowel function, reasonably well-controlled diabetes and was tolerating a regular diet.  Incision was clean, dry and intact.  The staples were removed from the Pfannenstiel, and the other ones were subcuticular dissolvable sutures.  DISCHARGE INSTRUCTIONS:       The patient was discharged home with full instructions regarding activity limits, wound care, return to work, medications including Percocet and iron.  Her postoperative hemoglobin was 10 gram so she was instructed to take  iron for 30 days, one tablet daily with Chromagen.  The patient was fully apprised of all of these instructions and was going to follow up in three to four weeks in my office. Dictated by:   Ronda Fairly. Galen Daft, M.D. Attending Physician:  Barbaraann Cao DD:  05/18/02 TD:  05/24/02 Job: [id number]

## 2011-06-13 ENCOUNTER — Other Ambulatory Visit: Payer: Self-pay | Admitting: Family Medicine

## 2011-06-14 NOTE — Telephone Encounter (Signed)
DONE. Noted on scripts to advise pt to schedule office visit

## 2011-07-07 ENCOUNTER — Other Ambulatory Visit: Payer: Self-pay | Admitting: Obstetrics and Gynecology

## 2011-07-07 DIAGNOSIS — Z1231 Encounter for screening mammogram for malignant neoplasm of breast: Secondary | ICD-10-CM

## 2011-07-11 ENCOUNTER — Other Ambulatory Visit: Payer: Self-pay | Admitting: Internal Medicine

## 2011-07-11 MED ORDER — METFORMIN HCL 1000 MG PO TABS: 1000.0000 mg | ORAL_TABLET | Freq: Two times a day (BID) | ORAL | Status: DC

## 2011-07-11 NOTE — Telephone Encounter (Signed)
Called pharmacy [facility] to check on lisinopril-hctz and on file the pharmacy had no refills; rx called in with refills.

## 2011-07-13 ENCOUNTER — Ambulatory Visit (HOSPITAL_BASED_OUTPATIENT_CLINIC_OR_DEPARTMENT_OTHER): Payer: BC Managed Care – PPO

## 2011-07-22 ENCOUNTER — Other Ambulatory Visit: Payer: Self-pay | Admitting: Family Medicine

## 2011-07-25 ENCOUNTER — Inpatient Hospital Stay (HOSPITAL_BASED_OUTPATIENT_CLINIC_OR_DEPARTMENT_OTHER): Admission: RE | Admit: 2011-07-25 | Payer: BC Managed Care – PPO | Source: Ambulatory Visit

## 2011-07-25 ENCOUNTER — Telehealth: Payer: Self-pay | Admitting: Internal Medicine

## 2011-07-25 NOTE — Telephone Encounter (Signed)
[facility]

## 2011-07-25 NOTE — Telephone Encounter (Signed)
Done

## 2011-07-25 NOTE — Telephone Encounter (Signed)
Patient is out of onglyza - she understands she needs to make an appointment but needs to check work schedule when she can get off

## 2011-08-02 ENCOUNTER — Ambulatory Visit (INDEPENDENT_AMBULATORY_CARE_PROVIDER_SITE_OTHER): Payer: BC Managed Care – PPO | Admitting: Family Medicine

## 2011-08-02 ENCOUNTER — Encounter: Payer: Self-pay | Admitting: Family Medicine

## 2011-08-02 DIAGNOSIS — E785 Hyperlipidemia, unspecified: Secondary | ICD-10-CM

## 2011-08-02 DIAGNOSIS — I10 Essential (primary) hypertension: Secondary | ICD-10-CM

## 2011-08-02 DIAGNOSIS — IMO0001 Reserved for inherently not codable concepts without codable children: Secondary | ICD-10-CM

## 2011-08-02 LAB — LIPID PANEL
Cholesterol: 185 mg/dL (ref 0–200)
HDL: 42.4 mg/dL (ref >39.00)
Triglycerides: 318 mg/dL — ABNORMAL HIGH (ref 0.0–149.0)

## 2011-08-02 LAB — HEPATIC FUNCTION PANEL
ALT: 18 U/L (ref 0–35)
Bilirubin, Direct: 0 mg/dL (ref 0.0–0.3)
Total Bilirubin: 0.5 mg/dL (ref 0.3–1.2)
Total Protein: 7 g/dL (ref 6.0–8.3)

## 2011-08-02 LAB — BASIC METABOLIC PANEL
BUN: 16 mg/dL (ref 6–23)
CO2: 30 mEq/L (ref 19–32)
Calcium: 9.3 mg/dL (ref 8.4–10.5)
Creatinine, Ser: 1.2 mg/dL (ref 0.4–1.2)

## 2011-08-02 NOTE — Patient Instructions (Signed)
Follow up in 3 months to recheck sugars We'll notify you of your lab results and make any changes as needed Call with any questions or concerns HANG IN THERE!  We'll get through this together!!!

## 2011-08-02 NOTE — Assessment & Plan Note (Signed)
LDL goal is 70 due to DM.  Check labs and start meds prn.  Pt expressed understanding and is in agreement w/ plan.

## 2011-08-02 NOTE — Assessment & Plan Note (Signed)
Chronic problem for pt.  Very frustrated by the inconsistency in CBGs.  Attempting to eat right and walk regularly.  UTD on eye exam.  Taking meds as directed.  Check A1C- adjust meds prn.

## 2011-08-02 NOTE — Progress Notes (Signed)
  Subjective:    Patient ID: Kathleen Acevedo, female    DOB: 21-Mar-1961, 50 y.o.   MRN: [id number]  HPI DM- chronic problem, reports CBGs have been labile.  On 25 units Lantus + metformin and onglyza.  Denies symptomatic lows.  No CP, SOB, HAs, visual changes, edema.  UTD on eye exam.  HTN- chronic problem for pt, well controlled.  On Lisinopril HCT.  Asymptomatic.  Hyperlipidemia- LDL at last visit was 105, goal w/ DM is closer to 70.  Has never been on statin.   Review of Systems For ROS see HPI     Objective:   Physical Exam  Vitals reviewed. Constitutional: She is oriented to person, place, and time. She appears well-developed and well-nourished. No distress.  HENT:  Head: Normocephalic and atraumatic.  Eyes: Conjunctivae and EOM are normal. Pupils are equal, round, and reactive to light.  Neck: Normal range of motion. Neck supple. No thyromegaly present.  Cardiovascular: Normal rate, regular rhythm, normal heart sounds and intact distal pulses.   No murmur heard. Pulmonary/Chest: Effort normal and breath sounds normal. No respiratory distress.  Abdominal: Soft. She exhibits no distension. There is no tenderness.  Musculoskeletal: She exhibits no edema.  Lymphadenopathy:    She has no cervical adenopathy.  Neurological: She is alert and oriented to person, place, and time.  Skin: Skin is warm and dry.  Psychiatric: She has a normal mood and affect. Her behavior is normal.          Assessment & Plan:

## 2011-08-02 NOTE — Assessment & Plan Note (Signed)
Chronic problem, asymptomatic.  Well controlled.  No changes. 

## 2011-08-05 ENCOUNTER — Telehealth: Payer: Self-pay

## 2011-08-05 LAB — HEMOGLOBIN A1C: Hgb A1c MFr Bld: 12.4 % — ABNORMAL HIGH (ref 4.6–6.5)

## 2011-08-05 NOTE — Telephone Encounter (Signed)
Message copied by Beverely Low on Fri Aug 05, 2011  3:35 PM ------      Message from: Sheliah Hatch      Created: Fri Aug 05, 2011  1:03 PM       A1C is now 12.4.  Triglycerides are also very high but this is likely b/c of high sugars and will improve as sugars come down.  i know she is struggling w/ her diabetes and is frustrated- please refer to Endo for help in management.

## 2011-08-05 NOTE — Telephone Encounter (Signed)
Pt aware, labs mailed, and referral placed

## 2011-08-05 NOTE — Progress Notes (Signed)
Quick Note:  Pt aware, labs mailed and referral placed ______

## 2011-08-09 ENCOUNTER — Telehealth: Payer: Self-pay

## 2011-08-09 MED ORDER — METFORMIN HCL 1000 MG PO TABS: 1000.0000 mg | ORAL_TABLET | Freq: Two times a day (BID) | ORAL | Status: DC

## 2011-08-09 MED ORDER — SAXAGLIPTIN HCL 5 MG PO TABS: 5.0000 mg | ORAL_TABLET | Freq: Every day | ORAL | Status: DC

## 2011-08-09 NOTE — Telephone Encounter (Signed)
Pt requesting refills on onglyza and metformin.  Rx'es sent to pharmacy

## 2011-08-12 ENCOUNTER — Ambulatory Visit: Payer: BC Managed Care – PPO | Admitting: Endocrinology

## 2011-08-17 ENCOUNTER — Ambulatory Visit (INDEPENDENT_AMBULATORY_CARE_PROVIDER_SITE_OTHER): Payer: BC Managed Care – PPO | Admitting: Endocrinology

## 2011-08-17 ENCOUNTER — Encounter: Payer: Self-pay | Admitting: Endocrinology

## 2011-08-17 VITALS — BP 130/72 | HR 62 | Temp 98.1°F | Ht 68.0 in | Wt 239.2 lb

## 2011-08-17 DIAGNOSIS — IMO0001 Reserved for inherently not codable concepts without codable children: Secondary | ICD-10-CM

## 2011-08-17 NOTE — Patient Instructions (Addendum)
good diet and exercise habits significanly improve the control of your diabetes.  please let me know if you wish to be referred to a dietician, or for weight-loss surgery.  high blood sugar is very risky to your health.  you should see an eye doctor every year. controlling your blood pressure and cholesterol drastically reduces the damage diabetes does to your body.  this also applies to quitting smoking.  please discuss these with your doctor.  you should take an aspirin every day, unless you have been advised by a doctor not to. check your blood sugar 2 times a day.  vary the time of day when you check, between before the 3 meals, and at bedtime.  also check if you have symptoms of your blood sugar being too high or too low.  please keep a record of the readings and bring it to your next appointment here.  please call us sooner if you are having low blood sugar episodes, or if it stays over 200. we will need to take this complex situation in stages.  For now, stop the onglyza and metformin, and: Increase the lantus to 35 units at bedtime.  Then continue to increase until blood sugar at some time of day is as low as the low100's. You will receive a phone-call, about a date and time for an informational meeting, about weight-loss surgery.   Please come back for a follow-up appointment in 2 weeks.

## 2011-08-17 NOTE — Progress Notes (Signed)
Subjective:    Patient ID: Kathleen Acevedo, female    DOB: 1961/04/26, 50 y.o.   MRN: [id number]  HPI pt states 27 years h/o dm.  she is unaware of any chronic complications.  she has been on insulin x approx 12 years.  She says cbg has ranged from 140-300.   pt says her diet is just "ok," but exercise is good.  pt states she feels well in general.  She has only a few years of intermittent slight cramps in the legs, but no assoc numbness.   Past Medical History  Diagnosis Date  . Diabetes mellitus   . Hyperlipidemia   . Hypertension     Past Surgical History  Procedure Date  . Abdominal hysterectomy   . Oophorectomy     History   Social History  . Marital Status: Married    Spouse Name: N/A    Number of Children: N/A  . Years of Education: N/A   Occupational History  . Not on file.   Social History Main Topics  . Smoking status: Never Smoker   . Smokeless tobacco: Not on file  . Alcohol Use: No  . Drug Use: No  . Sexually Active:    Other Topics Concern  . Not on file   Social History Narrative  . No narrative on file    Current Outpatient Prescriptions on File Prior to Visit  Medication Sig Dispense Refill  . Ferrous Sulfate (IRON) 325 (65 FE) MG TABS Take by mouth daily.        [name] glucose blood (ONE TOUCH ULTRA TEST) test strip 1 each by Other route 2 (two) times daily. Use as instructed       . insulin glargine (LANTUS SOLOSTAR) 100 UNIT/ML injection Inject 25 Units into the skin at bedtime.  3 mL  6  . lisinopril-hydrochlorothiazide (ZESTORETIC) 20-12.5 MG per tablet Take 1 tablet by mouth daily.  30 tablet  11  . metFORMIN (GLUCOPHAGE) 1000 MG tablet Take 1 tablet (1,000 mg total) by mouth 2 (two) times daily with a meal.  60 tablet  3  . Multiple Vitamin (MULTIVITAMIN PO) Take by mouth daily.        Letta Pate DELICA LANCETS MISC by Does not apply route 2 (two) times daily.        . saxagliptin HCl (ONGLYZA) 5 MG TABS tablet Take 1 tablet (5 mg total) by mouth  daily.  30 tablet  3    No Known Allergies  Family History  Problem Relation Age of Onset  . Diabetes Mother     BP 130/72  Pulse 62  Temp(Src) 98.1 F (36.7 C) (Oral)  Ht 5\' 8"  (1.727 m)  Wt 239 lb 4 oz (108.523 kg)  BMI 36.38 kg/m2  SpO2 99%   Review of Systems denies weight loss, blurry vision, headache, chest pain, sob, n/v, urinary frequency, muscle weakness,  excessive diaphoresis, memory loss, depression, menopausal sxs, and rhinorrhea.  She has easy bruising.    Objective:   Physical Exam VS: see vs page GEN: no distress HEAD: head: no deformity eyes: no periorbital swelling, no proptosis external nose and ears are normal mouth: no lesion seen NECK: supple, thyroid is not enlarged CHEST WALL: no deformity LUNGS:  Clear to auscultation CV: reg rate and rhythm, no murmur ABD: abdomen is soft, nontender.  no hepatosplenomegaly.  not distended.  no hernia MUSCULOSKELETAL: muscle bulk and strength are grossly normal.  no obvious joint swelling.  gait is normal  and steady EXTEMITIES: no deformity.  no ulcer on the feet.  feet are of normal color and temp.  no edema.  There is bilateral onychomycosis.  There is a healed scar at the left medical malleolus (childhood injury).   PULSES: dorsalis pedis intact bilat.  no carotid bruit NEURO:  cn 2-12 grossly intact.   readily moves all 4's.  sensation is intact to touch on the feet SKIN:  Normal texture and temperature.  No rash or suspicious lesion is visible.   NODES:  None palpable at the neck PSYCH: alert, oriented x3.  Does not appear anxious nor depressed.  Lab Results  Component Value Date   HGBA1C 12.4* 08/02/2011      Assessment & Plan:  Dm, with severe hyperglycemia. Leg cramps, possibly due to dm. Chronic weight gain.  She would benefit from weight-loss surgery.

## 2011-09-01 ENCOUNTER — Ambulatory Visit: Payer: BC Managed Care – PPO | Admitting: Endocrinology

## 2011-09-26 ENCOUNTER — Ambulatory Visit: Payer: BC Managed Care – PPO | Admitting: Endocrinology

## 2011-09-26 ENCOUNTER — Encounter: Payer: Self-pay | Admitting: Endocrinology

## 2011-09-26 ENCOUNTER — Ambulatory Visit (INDEPENDENT_AMBULATORY_CARE_PROVIDER_SITE_OTHER): Payer: BC Managed Care – PPO | Admitting: Endocrinology

## 2011-09-26 DIAGNOSIS — IMO0001 Reserved for inherently not codable concepts without codable children: Secondary | ICD-10-CM

## 2011-09-26 MED ORDER — INSULIN LISPRO 100 UNIT/ML ~~LOC~~ SOLN: 10.0000 [IU] | Freq: Three times a day (TID) | SUBCUTANEOUS | Status: DC

## 2011-09-26 NOTE — Progress Notes (Signed)
  Subjective:    Patient ID: Kathleen Acevedo, female    DOB: 1961/06/24, 50 y.o.   MRN: [id number]  HPI Pt returns for f/u of insulin-requiring DM (1985).  She has increased the lantus to 60 units qd.  no cbg record, but states cbg's vary from 175-219.  It is in general higher as the day goes on Past Medical History  Diagnosis Date  . Diabetes mellitus   . Hyperlipidemia   . Hypertension     Past Surgical History  Procedure Date  . Abdominal hysterectomy   . Oophorectomy     History   Social History  . Marital Status: Married    Spouse Name: N/A    Number of Children: N/A  . Years of Education: N/A   Occupational History  . Not on file.   Social History Main Topics  . Smoking status: Never Smoker   . Smokeless tobacco: Not on file  . Alcohol Use: No  . Drug Use: No  . Sexually Active:    Other Topics Concern  . Not on file   Social History Narrative  . No narrative on file    Current Outpatient Prescriptions on File Prior to Visit  Medication Sig Dispense Refill  . Ferrous Sulfate (IRON) 325 (65 FE) MG TABS Take by mouth daily.        [name] glucose blood (ONE TOUCH ULTRA TEST) test strip 1 each by Other route 2 (two) times daily. Use as instructed       . insulin glargine (LANTUS) 100 UNIT/ML injection Inject 60 Units into the skin at bedtime.       [name] lisinopril-hydrochlorothiazide (ZESTORETIC) 20-12.5 MG per tablet Take 1 tablet by mouth daily.  30 tablet  11  . Multiple Vitamin (MULTIVITAMIN PO) Take by mouth daily.        Letta Pate DELICA LANCETS MISC by Does not apply route 2 (two) times daily.          No Known Allergies  Family History  Problem Relation Age of Onset  . Diabetes Mother     BP 126/72  Pulse 65  Temp(Src) 98.2 F (36.8 C) (Oral)  Ht 5\' 8"  (1.727 m)  Wt 240 lb 6 oz (109.033 kg)  BMI 36.55 kg/m2  SpO2 97%  Review of Systems denies hypoglycemia    Objective:   Physical Exam VITAL SIGNS:  See vs page GENERAL: no distress SKIN:   Insulin injection sites at the anterior abdomen are normal.       Assessment & Plan:  DM, improved, but needs increased rx

## 2011-09-26 NOTE — Patient Instructions (Addendum)
check your blood sugar 2 times a day.  vary the time of day when you check, between before the 3 meals, and at bedtime.  also check if you have symptoms of your blood sugar being too high or too low.  please keep a record of the readings and bring it to your next appointment here.  please call us sooner if you are having low blood sugar episodes, or if it stays over 200. we will need to take this complex situation in stages.  continue lantus 60 units at bedtime.   Add humalog 10 units 3x a day (just before each meal).  Please come back for a follow-up appointment in 2 weeks.

## 2011-10-03 ENCOUNTER — Ambulatory Visit: Payer: BC Managed Care – PPO | Admitting: Endocrinology

## 2011-11-09 ENCOUNTER — Telehealth: Payer: Self-pay | Admitting: *Deleted

## 2011-11-09 NOTE — Telephone Encounter (Signed)
Spoke with MD and patient. MD is unable to see patient today and pt declined OV with MD of the day. I offered pt apts tomorrow. She will call back to schedule or go to UC/ER if symptoms become severe.

## 2011-11-09 NOTE — Telephone Encounter (Signed)
Patient requesting work in apt today. C/o burning in feet and back x 2 days. Symptoms are worse today and she feels she can not wait until tomorrow. Please advise.

## 2011-11-09 NOTE — Telephone Encounter (Signed)
Noted. Agree.

## 2011-11-11 ENCOUNTER — Ambulatory Visit (INDEPENDENT_AMBULATORY_CARE_PROVIDER_SITE_OTHER): Payer: BC Managed Care – PPO | Admitting: Family Medicine

## 2011-11-11 ENCOUNTER — Encounter: Payer: Self-pay | Admitting: Family Medicine

## 2011-11-11 DIAGNOSIS — M79675 Pain in left toe(s): Secondary | ICD-10-CM

## 2011-11-11 DIAGNOSIS — M79609 Pain in unspecified limb: Secondary | ICD-10-CM

## 2011-11-11 DIAGNOSIS — IMO0001 Reserved for inherently not codable concepts without codable children: Secondary | ICD-10-CM

## 2011-11-11 NOTE — Progress Notes (Signed)
  Subjective:    Patient ID: Kathleen Acevedo, female    DOB: [birth year], 51 y.o.   MRN: [id number]  HPI Foot pain- L 2nd toe, swollen.  Monday wore a shoe all day that was uncomfortable w/out realizing there was a sock stuck in the toe causing her toes to bunch.  DM- saw Dr Everardo All, office is too far to travel.  Would like to see someone in [place] if possible.   Review of Systems For ROS see HPI     Objective:   Physical Exam  Vitals reviewed. Constitutional: She appears well-developed and well-nourished. No distress.  Musculoskeletal: Normal range of motion. She exhibits edema (L 2nd toe bruised, swollen- mostly at MTP joint.  minimal TTP over joints or w/ PIP/DIP flexion).  Skin: Skin is warm and dry.          Assessment & Plan:

## 2011-11-11 NOTE — Patient Instructions (Signed)
Your toe pain and swelling is due to jamming your toe Ibuprofen for pain ICE for swelling Continue to wear wide shoes to prevent squishing your toes We'll call you with your Endo appt Call with any questions or concerns Hang in there!!

## 2011-11-27 NOTE — Assessment & Plan Note (Signed)
Pt reports Dr [name] Hugh office is too far.  Will refer to someone in [place] for easier access.

## 2011-11-27 NOTE — Assessment & Plan Note (Signed)
Due to jamming foot against front of shoe.  No bony tenderness or pain w/ joint flexion.  Ice, NSAIDs, wide shoes.  Reviewed supportive care and red flags that should prompt return.  Pt expressed understanding and is in agreement w/ plan.

## 2011-12-16 ENCOUNTER — Telehealth: Payer: Self-pay | Admitting: Internal Medicine

## 2011-12-16 DIAGNOSIS — I10 Essential (primary) hypertension: Secondary | ICD-10-CM

## 2011-12-16 MED ORDER — LISINOPRIL-HYDROCHLOROTHIAZIDE 20-12.5 MG PO TABS: 1.0000 | ORAL_TABLET | Freq: Every day | ORAL | Status: DC

## 2011-12-16 NOTE — Telephone Encounter (Signed)
Refill: Lisino/hctz 20-12.5 tab. Take 1 tablet by mouth every day. Qty 30. Last fill 10-15-11

## 2011-12-16 NOTE — Telephone Encounter (Signed)
rx sent to pharmacy by e-script  

## 2011-12-26 ENCOUNTER — Other Ambulatory Visit: Payer: Self-pay | Admitting: *Deleted

## 2011-12-26 MED ORDER — METFORMIN HCL 1000 MG PO TABS: 1000.0000 mg | ORAL_TABLET | Freq: Two times a day (BID) | ORAL | Status: DC

## 2011-12-26 NOTE — Telephone Encounter (Signed)
Rx sent 

## 2011-12-30 ENCOUNTER — Ambulatory Visit (INDEPENDENT_AMBULATORY_CARE_PROVIDER_SITE_OTHER): Payer: BC Managed Care – PPO | Admitting: Family Medicine

## 2011-12-30 ENCOUNTER — Encounter: Payer: Self-pay | Admitting: Family Medicine

## 2011-12-30 ENCOUNTER — Ambulatory Visit: Payer: BC Managed Care – PPO | Admitting: Family Medicine

## 2011-12-30 VITALS — BP 122/80 | HR 68 | Temp 98.3°F | Ht 67.75 in | Wt 248.0 lb

## 2011-12-30 DIAGNOSIS — J019 Acute sinusitis, unspecified: Secondary | ICD-10-CM

## 2011-12-30 MED ORDER — AMOXICILLIN 875 MG PO TABS: 875.0000 mg | ORAL_TABLET | Freq: Two times a day (BID) | ORAL | Status: DC

## 2011-12-30 MED ORDER — GUAIFENESIN-CODEINE 100-10 MG/5ML PO SYRP: 10.0000 mL | ORAL_SOLUTION | Freq: Three times a day (TID) | ORAL | Status: AC | PRN

## 2011-12-30 NOTE — Assessment & Plan Note (Signed)
Pt's sxs and PE consistent w/ infxn.  Start abx.  Reviewed supportive care and red flags that should prompt return.  Pt expressed understanding and is in agreement w/ plan.  

## 2011-12-30 NOTE — Patient Instructions (Signed)
This is a sinus infection Start the Amoxicillin twice daily- take w/ food Drink plenty of fluids REST! Use the cough syrup as needed Add claritin or zyrtec daily Use Mucinex to thin your congestion Call with any questions or concerns Hang in there!!!

## 2011-12-30 NOTE — Progress Notes (Signed)
  Subjective:    Patient ID: Kathleen Acevedo, female    DOB: 1961/05/21, 51 y.o.   MRN: [id number]  HPI URI- sxs started yesterday w/ sore throat and rapidly progressed to congestion, sinus pressure, cough- dry.  No fevers but chills.  + sick contacts.     Review of Systems For ROS see HPI     Objective:   Physical Exam  Vitals reviewed. Constitutional: She appears well-developed and well-nourished. No distress.  HENT:  Head: Normocephalic and atraumatic.  Right Ear: Tympanic membrane normal.  Left Ear: Tympanic membrane normal.  Nose: Mucosal edema and rhinorrhea present. Right sinus exhibits maxillary sinus tenderness and frontal sinus tenderness. Left sinus exhibits maxillary sinus tenderness and frontal sinus tenderness.  Mouth/Throat: Uvula is midline and mucous membranes are normal. Posterior oropharyngeal erythema present. No oropharyngeal exudate.  Eyes: Conjunctivae and EOM are normal. Pupils are equal, round, and reactive to light.  Neck: Normal range of motion. Neck supple.  Cardiovascular: Normal rate, regular rhythm and normal heart sounds.   Pulmonary/Chest: Effort normal and breath sounds normal. No respiratory distress. She has no wheezes.  Lymphadenopathy:    She has no cervical adenopathy.          Assessment & Plan:

## 2012-03-27 ENCOUNTER — Telehealth: Payer: Self-pay | Admitting: Internal Medicine

## 2012-03-27 DIAGNOSIS — I10 Essential (primary) hypertension: Secondary | ICD-10-CM

## 2012-03-27 MED ORDER — LISINOPRIL-HYDROCHLOROTHIAZIDE 20-12.5 MG PO TABS: 1.0000 | ORAL_TABLET | Freq: Every day | ORAL | Status: DC

## 2012-03-27 NOTE — Telephone Encounter (Signed)
Refill: Lisinopril/hctz 20-12.5 tab. Take one tablet by mouth every day. Qty 30. Last fill 03-09-12

## 2012-03-27 NOTE — Telephone Encounter (Signed)
RX sent

## 2012-08-23 ENCOUNTER — Encounter: Payer: Self-pay | Admitting: Family Medicine

## 2012-08-23 ENCOUNTER — Ambulatory Visit (INDEPENDENT_AMBULATORY_CARE_PROVIDER_SITE_OTHER): Payer: BC Managed Care – PPO | Admitting: Family Medicine

## 2012-08-23 VITALS — BP 128/82 | HR 80 | Temp 98.6°F | Ht 68.75 in | Wt 239.4 lb

## 2012-08-23 DIAGNOSIS — L732 Hidradenitis suppurativa: Secondary | ICD-10-CM

## 2012-08-23 DIAGNOSIS — Z0000 Encounter for general adult medical examination without abnormal findings: Secondary | ICD-10-CM | POA: Insufficient documentation

## 2012-08-23 MED ORDER — DOXYCYCLINE HYCLATE 100 MG PO TABS: 100.0000 mg | ORAL_TABLET | Freq: Two times a day (BID) | ORAL | Status: DC

## 2012-08-23 NOTE — Progress Notes (Signed)
  Subjective:    Patient ID: Kathleen Acevedo, female    DOB: December 06, 1960, 51 y.o.   MRN: [id number]  HPI CPE- UTD on GYN Neva Seat).  No colonoscopy- pt considering it, open to consultation.  Hidradenitis- chronic problem, had surgery 'years ago'.  Now w/ 2 draining areas under L arm.  + pain, foul smelling drainage.   Review of Systems Patient reports no vision/ hearing changes, adenopathy,fever, weight change,  persistant/recurrent hoarseness , swallowing issues, chest pain, palpitations, edema, persistant/recurrent cough, hemoptysis, dyspnea (rest/exertional/paroxysmal nocturnal), gastrointestinal bleeding (melena, rectal bleeding), abdominal pain, significant heartburn, bowel changes, GU symptoms (dysuria, hematuria, incontinence), Gyn symptoms (abnormal  bleeding, pain),  syncope, focal weakness, memory loss, numbness & tingling, skin/hair/nail changes, abnormal bruising or bleeding, anxiety, or depression.     Objective:   Physical Exam General Appearance:    Alert, cooperative, no distress, appears stated age  Head:    Normocephalic, without obvious abnormality, atraumatic  Eyes:    PERRL, conjunctiva/corneas clear, EOM's intact, fundi    benign, both eyes  Ears:    Normal TM's and external ear canals, both ears  Nose:   Nares normal, septum midline, mucosa normal, no drainage    or sinus tenderness  Throat:   Lips, mucosa, and tongue normal; teeth and gums normal  Neck:   Supple, symmetrical, trachea midline, no adenopathy;    Thyroid: no enlargement/tenderness/nodules  Back:     Symmetric, no curvature, ROM normal, no CVA tenderness  Lungs:     Clear to auscultation bilaterally, respirations unlabored  Chest Wall:    No tenderness or deformity   Heart:    Regular rate and rhythm, S1 and S2 normal, no murmur, rub   or gallop  Breast Exam:    Deferred to GYN  Abdomen:     Soft, non-tender, bowel sounds active all four quadrants,    no masses, no organomegaly  Genitalia:    Deferred  to GYN  Rectal:    Extremities:   Extremities normal, atraumatic, no cyanosis or edema  Pulses:   2+ and symmetric all extremities  Skin:   Skin color, texture, turgor normal.  + hidradenitis in L axilla.  2 draining abscessed areas- large area is 2.5-3 cm, smaller area is 1.5 cm.  Copious pus expressed w/ gentle squeezing until fluid was clear and bloody  Lymph nodes:   Cervical, supraclavicular nodes normal.  L axilary LAD  Neurologic:   CNII-XII intact, normal strength, sensation and reflexes    throughout          Assessment & Plan:

## 2012-08-23 NOTE — Patient Instructions (Addendum)
We'll notify you of your lab results and fax them to Dr Allena Katz Start the Doxycycline twice daily- take w/ food Apply hot compresses to the area to help them drain Try and get regular exercise for both health and stress relief Call with any questions or concerns Happy [birth year] Birthday!!!

## 2012-08-24 LAB — HEPATIC FUNCTION PANEL
ALT: 15 U/L (ref 0–35)
AST: 12 U/L (ref 0–37)
Alkaline Phosphatase: 66 U/L (ref 39–117)
Bilirubin, Direct: 0 mg/dL (ref 0.0–0.3)
Total Bilirubin: 0.3 mg/dL (ref 0.3–1.2)
Total Protein: 7 g/dL (ref 6.0–8.3)

## 2012-08-24 LAB — CBC WITH DIFFERENTIAL/PLATELET
Basophils Absolute: 0 10*3/uL (ref 0.0–0.1)
Eosinophils Absolute: 0.1 10*3/uL (ref 0.0–0.7)
Hemoglobin: 11.3 g/dL — ABNORMAL LOW (ref 12.0–15.0)
Lymphocytes Relative: 34.2 % (ref 12.0–46.0)
Monocytes Relative: 4.2 % (ref 3.0–12.0)
Neutro Abs: 2.9 10*3/uL (ref 1.4–7.7)
Neutrophils Relative %: 58.5 % (ref 43.0–77.0)
Platelets: 254 10*3/uL (ref 150.0–400.0)
RDW: 13.7 % (ref 11.5–14.6)

## 2012-08-24 LAB — LIPID PANEL
Total CHOL/HDL Ratio: 5
VLDL: 62.2 mg/dL — ABNORMAL HIGH (ref 0.0–40.0)

## 2012-08-24 LAB — BASIC METABOLIC PANEL
CO2: 28 mEq/L (ref 19–32)
Chloride: 99 mEq/L (ref 96–112)
Potassium: 4 mEq/L (ref 3.5–5.1)
Sodium: 135 mEq/L (ref 135–145)

## 2012-08-24 LAB — LDL CHOLESTEROL, DIRECT: Direct LDL: 103.6 mg/dL

## 2012-08-27 ENCOUNTER — Encounter: Payer: Self-pay | Admitting: Family Medicine

## 2012-08-27 NOTE — Telephone Encounter (Signed)
Please advise 

## 2012-08-28 ENCOUNTER — Ambulatory Visit (INDEPENDENT_AMBULATORY_CARE_PROVIDER_SITE_OTHER): Payer: BC Managed Care – PPO | Admitting: Family Medicine

## 2012-08-28 ENCOUNTER — Telehealth: Payer: Self-pay | Admitting: Family Medicine

## 2012-08-28 ENCOUNTER — Encounter: Payer: Self-pay | Admitting: Family Medicine

## 2012-08-28 ENCOUNTER — Telehealth: Payer: Self-pay | Admitting: *Deleted

## 2012-08-28 VITALS — BP 144/76 | HR 77 | Temp 98.3°F | Wt 229.6 lb

## 2012-08-28 DIAGNOSIS — R109 Unspecified abdominal pain: Secondary | ICD-10-CM

## 2012-08-28 DIAGNOSIS — K529 Noninfective gastroenteritis and colitis, unspecified: Secondary | ICD-10-CM

## 2012-08-28 DIAGNOSIS — K5289 Other specified noninfective gastroenteritis and colitis: Secondary | ICD-10-CM

## 2012-08-28 LAB — CBC WITH DIFFERENTIAL/PLATELET
Basophils Relative: 0.2 % (ref 0.0–3.0)
Eosinophils Absolute: 0.1 10*3/uL (ref 0.0–0.7)
Eosinophils Relative: 1.4 % (ref 0.0–5.0)
Hemoglobin: 12.4 g/dL (ref 12.0–15.0)
Lymphocytes Relative: 21.2 % (ref 12.0–46.0)
MCHC: 32.7 g/dL (ref 30.0–36.0)
MCV: 87.6 fl (ref 78.0–100.0)
Neutro Abs: 4.3 10*3/uL (ref 1.4–7.7)
RBC: 4.32 Mil/uL (ref 3.87–5.11)
WBC: 6 10*3/uL (ref 4.5–10.5)

## 2012-08-28 LAB — POCT URINALYSIS DIPSTICK
Ketones, UA: NEGATIVE
Protein, UA: NEGATIVE
Spec Grav, UA: >=1.03
Urobilinogen, UA: 0.2
pH, UA: 6

## 2012-08-28 LAB — HEPATIC FUNCTION PANEL
ALT: 15 U/L (ref 0–35)
AST: 14 U/L (ref 0–37)
Alkaline Phosphatase: 74 U/L (ref 39–117)
Bilirubin, Direct: 0.1 mg/dL (ref 0.0–0.3)
Total Protein: 7 g/dL (ref 6.0–8.3)

## 2012-08-28 LAB — BASIC METABOLIC PANEL
CO2: 31 mEq/L (ref 19–32)
Calcium: 9.2 mg/dL (ref 8.4–10.5)
Chloride: 98 mEq/L (ref 96–112)
Sodium: 136 mEq/L (ref 135–145)

## 2012-08-28 MED ORDER — FENOFIBRATE 160 MG PO TABS: 160.0000 mg | ORAL_TABLET | Freq: Every day | ORAL | Status: DC

## 2012-08-28 NOTE — Progress Notes (Signed)
  Subjective:     Kathleen Acevedo is a 51 y.o. female who presents for evaluation of nonbilious vomiting 3 times per day, cramping pain located in in the entire abdomen, diarrhea >10 times per day, fever and nausea. Symptoms have been present for 3 days. Patient denies blood in stool, constipation, dark urine, dysuria, heartburn, hematemesis, hematuria and melena. Patient's oral intake has been decreased for liquids and decreased for solids. Patient's urine output has been adequate. Other contacts with similar symptoms include: husband and son have had loose stools as well but no NV. Patient denies recent travel history. Patient has had recent ingestion of possible contaminated food, toxic plants, or inappropriate medications/poisons. ---they all ate at the [employer] and shared food.  The following portions of the patient's history were reviewed and updated as appropriate: allergies, current medications, past family history, past medical history, past social history, past surgical history and problem list.  Review of Systems Pertinent items are noted in HPI.    Objective:     BP 144/76  Pulse 77  Temp 98.3 F (36.8 C) (Oral)  Wt 229 lb 9.6 oz (104.146 kg)  SpO2 97% General appearance: alert, cooperative, appears stated age and mild distress Throat: abnormal findings: slightly dry Neck: no adenopathy, supple, symmetrical, trachea midline and thyroid not enlarged, symmetric, no tenderness/mass/nodules Lungs: clear to auscultation bilaterally Heart: S1, S2 normal Abdomen: normal findings: soft and abnormal findings:  moderate tenderness L mid abd , + guarding no rebound    Assessment:    Acute Gastroenteritis    Plan:    1. Discussed oral rehydration, reintroduction of solid foods, signs of dehydration. 2. Return or go to emergency department if worsening symptoms, blood or bile, signs of dehydration, diarrhea lasting longer than 5 days or any new concerns. 3. Follow up prn

## 2012-08-28 NOTE — Telephone Encounter (Signed)
Caller: [name]/Patient; Patient Name: Kathleen Acevedo; PCP: Sheliah Hatch.; Best Callback Phone Number: [phone].  Patient calling about diarrhea x 3 days.  c/o abdominal cramping, mild headache.  Has not been able to go to work due to the diarrhea.  States attempted to get appt with Dr. Beverely Low today, but was told her schedule is full today.  Taking vibratabs for boil.  States has fever/tactile.  States is extremely bloated and tender, with abdominal swelling.  Per diarrhea protocol, advised appt within 4 hours; per Epic, patient has appt 1345 08/28/12 with Dr. Laury Axon; will keep that appt.

## 2012-08-28 NOTE — Telephone Encounter (Signed)
MD Beverely Low made aware verbally of pt state and apt with MD Lowne today

## 2012-08-28 NOTE — Patient Instructions (Addendum)
Food Poisoning °Food poisoning is an illness caused by something you ate or drank. There are over 250 known causes of food poisoning. However, many other causes are unknown. You can be treated even if the exact cause of your food poisoning is not known. In most cases, food poisoning is mild and lasts 1 to 2 days. However, some cases can be serious, especially for people with low immune systems, the elderly, children and infants, and pregnant women. °CAUSES  °Poor personal hygiene, improper cleaning of storage and preparation areas, and unclean utensils can cause infection or tainting (contamination) of foods. The causes of food poisoning are numerous. Infectious agents, such as viruses, bacteria, or parasites, can cause harm by infecting the intestine and disrupting the absorption of nutrients and water. This can cause diarrhea and lead to dehydration. Viruses are responsible for most of the food poisonings in which an agent is found. Parasites are less likely to cause food poisoning. Toxic agents, such as poisonous mushrooms, marine algae, and pesticides can also cause food poisoning. °· Viral causes of food poisoning include: °· Norovirus. °· Rotavirus. °· Hepatitis A. °· Bacterial causes of food poisoning include: °· Salmonellae. °· Campylobacter. °· Bacillus cereus. °· Escherichia coli (E. coli). °· Shigella. °· Listeria monocytogenes. °· Clostridium botulinum (botulism). °· Vibrio cholerae. °· Parasites that can cause food poisoning include: °· Giardia. °· Cryptosporidium. °· Toxoplasma. °SYMPTOMS °Symptoms may appear several hours or longer after consuming the contaminated food or drink. Symptoms may include: °· Nausea. °· Vomiting. °· Cramping. °· Diarrhea. °· Fever and chills. °· Muscle aches. °DIAGNOSIS °Your caregiver may be able to diagnose food poisoning from a list of what you have recently eaten and results from lab tests. Diagnostic tests may include an exam of the feces. °TREATMENT °In most cases,  treatment focuses on helping to relieve your symptoms and staying well hydrated. Antibiotics are rarely needed. In severe cases, hospitalization may be required. °PREVENTION  °· Wash your hands, food preparation surfaces, and utensils thoroughly before and after handling raw foods. °· Keep refrigerated foods below 40° F (5° C). °· Serve hot foods immediately or keep them heated above 140° F (60° C). °· Divide large volumes of food into small portions for rapid cooling in the refrigerator. Hot, bulky foods in the refrigerator can raise the temperature of other foods that have already cooled. °· Follow approved canning procedures. °· Heat canned foods thoroughly before tasting. °· When in doubt, throw it out. °· Infants, the elderly, women who are pregnant, and people with compromised immune systems are especially susceptible to food poisoning. These people should never consume unpasteurized cheese, unpasteurized cider, raw fish, raw seafood, or raw meat type products. °HOME CARE INSTRUCTIONS  °· Drink enough water and fluids to keep your urine clear or pale yellow. Drink small amounts of fluids frequently and increase as tolerated. °· Ask your caregiver for specific rehydration instructions. °· Avoid: °· Foods high in sugar. °· Alcohol. °· Carbonated drinks. °· Tobacco. °· Juice. °· Caffeine drinks. °· Extremely hot or cold fluids. °· Fatty, greasy foods. °· Too much intake of anything at one time. °· Dairy products until 24 to 48 hours after diarrhea stops. °· You may consume probiotics. Probiotics are active cultures of beneficial bacteria. They may lessen the amount and number of diarrheal stools in adults. Probiotics can be found in yogurt with active cultures and in supplements. °· Wash your hands well to avoid spreading the bacteria. °· Only take over-the-counter or prescription medicines for pain, discomfort,   You may consume probiotics. Probiotics are active cultures of beneficial bacteria. They may lessen the amount and number of diarrheal stools in adults. Probiotics can be found in yogurt with active cultures and in supplements.   Wash your hands well to avoid spreading the bacteria.   Only take over-the-counter or prescription medicines for pain, discomfort, or fever as directed by your caregiver. Do not give aspirin to children.   Ask your caregiver if you  should continue to take your regular prescribed and over-the-counter medicines.  SEEK IMMEDIATE MEDICAL CARE IF:    You have difficulty breathing, swallowing, talking, or moving.   You develop blurred vision.   You are unable to keep fluids down.   You faint or nearly faint.   Your eyes turn yellow.   Vomiting or diarrhea develops or becomes persistent.   Abdominal pain develops, increases, or localizes in one small area.   You have a fever.   The diarrhea becomes excessive or contains blood or mucus.   You develop excessive weakness, dizziness, or extreme thirst.   You have no urine for 8 hours.  MAKE SURE YOU:    Understand these instructions.   Will watch your condition.   Will get help right away if you are not doing well or get worse.  Document Released: 07/15/2004 Document Revised: 01/09/2012 Document Reviewed: 03/03/2011  Exit[employer] Patient Information 2013 [employer].

## 2012-08-28 NOTE — Telephone Encounter (Signed)
Called pt to clarify if she took the ABT with food, pt notes that she did and the diarrhea did not start til she took the current ABT, offered to switch pt to Bactrim DS BID 1 tab for 10days per MD Tabori verbal instructions, pt advised that she really needs a note for work as well as other sxs of headache and fever, advised pt that it would be best to keep her apt for today, and see MD Laury Axon

## 2012-08-29 LAB — VITAMIN D 1,25 DIHYDROXY
Vitamin D 1, 25 (OH)2 Total: 47 pg/mL (ref 18–72)
Vitamin D2 1, 25 (OH)2: <8 pg/mL

## 2012-08-29 LAB — HEPATITIS PANEL, ACUTE
HCV Ab: NEGATIVE
Hep A IgM: NEGATIVE

## 2012-08-30 LAB — URINE CULTURE
Colony Count: NO GROWTH
Organism ID, Bacteria: NO GROWTH

## 2012-09-02 NOTE — Assessment & Plan Note (Signed)
New.  Pt w/ hx of similar.  2 areas open and draining.  Start doxy to cover for possible MRSA.  Reviewed supportive care and red flags that should prompt return.  Pt expressed understanding and is in agreement w/ plan.

## 2012-09-02 NOTE — Assessment & Plan Note (Signed)
Pt's PE WNL. UTD on GYN.  Seeing Endo for DM.  Check labs.  Fax to Dr Allena Katz.  EKG done- see document for interpretation.  Anticipatory guidance provided.

## 2012-11-26 ENCOUNTER — Telehealth: Payer: Self-pay | Admitting: Family Medicine

## 2012-11-26 NOTE — Telephone Encounter (Signed)
Patient would like to know if we would call her in some cough medicine without being seen. She requested an appt with Dr. Beverely Low or Dr. Laury Axon for today and both are full. She did not want to see any other MD. Pt uses [facility] on [address] in [place]

## 2012-11-26 NOTE — Telephone Encounter (Signed)
Spoke with the pt and informed her that we will not be able to call anything out for her without her been seen.  Pt agreed and willing to schedule an appt with Dr. Beverely Low or Dr. Laury Axon on [address]. 11-27-12.//AB/CMA

## 2012-11-29 ENCOUNTER — Ambulatory Visit: Payer: Self-pay | Admitting: Family Medicine

## 2012-12-03 ENCOUNTER — Ambulatory Visit: Payer: BC Managed Care – PPO | Admitting: Family Medicine

## 2012-12-03 DIAGNOSIS — Z0289 Encounter for other administrative examinations: Secondary | ICD-10-CM

## 2013-01-23 ENCOUNTER — Telehealth: Payer: Self-pay | Admitting: Family Medicine

## 2013-01-23 DIAGNOSIS — Z1211 Encounter for screening for malignant neoplasm of colon: Secondary | ICD-10-CM

## 2013-01-23 NOTE — Telephone Encounter (Signed)
Ok for GI referral- dx colon cancer screen.  In scheduling instructions, please specify Friday

## 2013-01-23 NOTE — Telephone Encounter (Signed)
Patient would like a referral for a colonoscopy. She would prefer a Friday.

## 2013-01-23 NOTE — Telephone Encounter (Signed)
Please advise     KP//CMA

## 2013-01-25 NOTE — Telephone Encounter (Signed)
Done  KP

## 2013-01-28 ENCOUNTER — Encounter: Payer: Self-pay | Admitting: Gastroenterology

## 2013-01-31 ENCOUNTER — Encounter: Payer: Self-pay | Admitting: Gastroenterology

## 2013-03-15 ENCOUNTER — Encounter: Payer: BC Managed Care – PPO | Admitting: Gastroenterology

## 2013-04-05 ENCOUNTER — Ambulatory Visit (AMBULATORY_SURGERY_CENTER): Payer: BC Managed Care – PPO | Admitting: *Deleted

## 2013-04-05 ENCOUNTER — Encounter: Payer: Self-pay | Admitting: Gastroenterology

## 2013-04-05 VITALS — Ht 68.0 in | Wt 214.0 lb

## 2013-04-05 DIAGNOSIS — Z1211 Encounter for screening for malignant neoplasm of colon: Secondary | ICD-10-CM

## 2013-04-05 MED ORDER — NA SULFATE-K SULFATE-MG SULF 17.5-3.13-1.6 GM/177ML PO SOLN: ORAL | Status: DC

## 2013-04-10 ENCOUNTER — Ambulatory Visit (INDEPENDENT_AMBULATORY_CARE_PROVIDER_SITE_OTHER): Payer: BC Managed Care – PPO | Admitting: Family Medicine

## 2013-04-10 ENCOUNTER — Encounter: Payer: Self-pay | Admitting: Family Medicine

## 2013-04-10 VITALS — BP 120/60 | HR 89 | Temp 98.3°F | Wt 215.2 lb

## 2013-04-10 DIAGNOSIS — B351 Tinea unguium: Secondary | ICD-10-CM | POA: Insufficient documentation

## 2013-04-10 MED ORDER — CICLOPIROX 8 % EX SOLN: Freq: Every day | CUTANEOUS | Status: DC

## 2013-04-10 NOTE — Progress Notes (Signed)
  Subjective:    Patient ID: Kathleen Acevedo, female    DOB: 1961/05/16, 52 y.o.   MRN: [id number]  HPI Toe nail discoloration- pt went for pedicure and when polish was removed pt noted that nail was brittle and discolored.  Did not appear that way 3 weeks prior at last pedicure.  No pain.  No drainage.  Pt w/ nail polish on other nails.   Review of Systems For ROS see HPI     Objective:   Physical Exam  Vitals reviewed. Constitutional: She appears well-developed and well-nourished. No distress.  Skin:  L great toe nail thickened, discolored, brittle- consistent w/ fungal infxn.          Assessment & Plan:

## 2013-04-10 NOTE — Assessment & Plan Note (Signed)
New.  Start topical tx prior to systemic meds.  Reviewed supportive care and red flags that should prompt return.  Pt expressed understanding and is in agreement w/ plan.

## 2013-04-10 NOTE — Patient Instructions (Addendum)
Use the ciclopirox as directed for the nail fungus If no improvement in 6-8 weeks, call and let us know Call with any questions or concerns Hang in there!

## 2013-04-12 ENCOUNTER — Ambulatory Visit: Payer: BC Managed Care – PPO | Admitting: Family Medicine

## 2013-04-19 ENCOUNTER — Ambulatory Visit (AMBULATORY_SURGERY_CENTER): Payer: BC Managed Care – PPO | Admitting: Gastroenterology

## 2013-04-19 ENCOUNTER — Encounter: Payer: Self-pay | Admitting: Gastroenterology

## 2013-04-19 VITALS — BP 168/87 | HR 61 | Temp 96.8°F | Resp 19 | Ht 68.0 in | Wt 215.0 lb

## 2013-04-19 DIAGNOSIS — Z1211 Encounter for screening for malignant neoplasm of colon: Secondary | ICD-10-CM

## 2013-04-19 DIAGNOSIS — K5730 Diverticulosis of large intestine without perforation or abscess without bleeding: Secondary | ICD-10-CM

## 2013-04-19 MED ORDER — SODIUM CHLORIDE 0.9 % IV SOLN: 500.0000 mL | INTRAVENOUS | Status: DC

## 2013-04-19 NOTE — Progress Notes (Signed)
Report to pacu rn vss, bbs=clear 

## 2013-04-19 NOTE — Patient Instructions (Addendum)
YOU HAD AN ENDOSCOPIC PROCEDURE TODAY AT THE [facility]: Refer to the procedure report that was given to you for any specific questions about what was found during the examination.  If the procedure report does not answer your questions, please call your gastroenterologist to clarify.  If you requested that your care partner not be given the details of your procedure findings, then the procedure report has been included in a sealed envelope for you to review at your convenience later.  YOU SHOULD EXPECT: Some feelings of bloating in the abdomen. Passage of more gas than usual.  Walking can help get rid of the air that was put into your GI tract during the procedure and reduce the bloating. If you had a lower endoscopy (such as a colonoscopy or flexible sigmoidoscopy) you may notice spotting of blood in your stool or on the toilet paper. If you underwent a bowel prep for your procedure, then you may not have a normal bowel movement for a few days.  DIET: Your first meal following the procedure should be a light meal and then it is ok to progress to your normal diet.  A half-sandwich or bowl of soup is an example of a good first meal.  Heavy or fried foods are harder to digest and may make you feel nauseous or bloated.  Likewise meals heavy in dairy and vegetables can cause extra gas to form and this can also increase the bloating.  Drink plenty of fluids but you should avoid alcoholic beverages for 24 hours.  ACTIVITY: Your care partner should take you home directly after the procedure.  You should plan to take it easy, moving slowly for the rest of the day.  You can resume normal activity the day after the procedure however you should NOT DRIVE or use heavy machinery for 24 hours (because of the sedation medicines used during the test).    SYMPTOMS TO REPORT IMMEDIATELY: A gastroenterologist can be reached at any hour.  During normal business hours, 8:30 AM to 5:00 PM Monday through Friday,  call [phone].  After hours and on weekends, please call the GI answering service at [phone] who will take a message and have the physician on call contact you.   Following lower endoscopy (colonoscopy or flexible sigmoidoscopy):  Excessive amounts of blood in the stool  Significant tenderness or worsening of abdominal pains  Swelling of the abdomen that is new, acute  Fever of 100F or higher   FOLLOW UP: If any biopsies were taken you will be contacted by phone or by letter within the next 1-3 weeks.  Call your gastroenterologist if you have not heard about the biopsies in 3 weeks.  Our staff will call the home number listed on your records the next business day following your procedure to check on you and address any questions or concerns that you may have at that time regarding the information given to you following your procedure. This is a courtesy call and so if there is no answer at the home number and we have not heard from you through the emergency physician on call, we will assume that you have returned to your regular daily activities without incident.  SIGNATURES/CONFIDENTIALITY: You and/or your care partner have signed paperwork which will be entered into your electronic medical record.  These signatures attest to the fact that that the information above on your After Visit Summary has been reviewed and is understood.  Full responsibility of the confidentiality of   this discharge information lies with you and/or your care-partner.  Diverticulosis, high fiber diet-handouts given  Repeat colonoscopy in 10 years.   

## 2013-04-19 NOTE — Progress Notes (Signed)
Patient did not experience any of the following events: a burn prior to discharge; a fall within the facility; wrong site/side/patient/procedure/implant event; or a hospital transfer or hospital admission upon discharge from the facility. (G8907) Patient did not have preoperative order for IV antibiotic SSI prophylaxis. (G8918)  

## 2013-04-19 NOTE — Op Note (Signed)
[facility] [address] [place], [address]   COLONOSCOPY PROCEDURE REPORT  PATIENT: Kathleen Acevedo, Kathleen Acevedo  MR#: [id number] BIRTHDATE: [birth year] , 51  yrs. old GENDER: Female ENDOSCOPIST: Louis Meckel, MD REFERRED AO:[id number] Beverely Low, M.D. PROCEDURE DATE:  04/19/2013 PROCEDURE:   Colonoscopy, diagnostic ASA CLASS:   Class II INDICATIONS:average risk screening. MEDICATIONS: MAC sedation, administered by CRNA and Propofol (Diprivan) 230 mg IV  DESCRIPTION OF PROCEDURE:   After the risks benefits and alternatives of the procedure were thoroughly explained, informed consent was obtained.  A digital rectal exam revealed no abnormalities of the rectum.   The [id number]  endoscope was introduced through the anus and advanced to the cecum, which was identified by both the appendix and ileocecal valve. No adverse events experienced.   The quality of the prep was excellent using Suprep  The instrument was then slowly withdrawn as the colon was fully examined.      COLON FINDINGS: Mild diverticulosis was noted in the ascending colon.   The colon mucosa was otherwise normal.  Retroflexed views revealed no abnormalities. The time to cecum=2 minutes 51 seconds. Withdrawal time=7 minutes 29 seconds.  The scope was withdrawn and the procedure completed. COMPLICATIONS: There were no complications.  ENDOSCOPIC IMPRESSION: 1.   Mild diverticulosis was noted in the ascending colon 2.   The colon mucosa was otherwise normal  RECOMMENDATIONS: Continue current colorectal screening recommendations for "routine risk" patients with a repeat colonoscopy in 10 years.   eSigned:  Louis Meckel, MD 04/19/2013 8:56 AM   cc:

## 2013-04-22 ENCOUNTER — Telehealth: Payer: Self-pay | Admitting: *Deleted

## 2013-04-22 NOTE — Telephone Encounter (Signed)
  Follow up Call-  Call back number 04/19/2013  Post procedure Call Back phone  # [phone]  Permission to leave phone message Yes     Patient questions:  Do you have a fever, pain , or abdominal swelling? no Pain Score  0 *  Have you tolerated food without any problems? yes  Have you been able to return to your normal activities? yes  Do you have any questions about your discharge instructions: Diet   no Medications  no Follow up visit  no  Do you have questions or concerns about your Care? no  Actions: * If pain score is 4 or above: No action needed, pain <4.

## 2013-07-24 LAB — HM DIABETES EYE EXAM

## 2013-08-01 ENCOUNTER — Other Ambulatory Visit: Payer: Self-pay | Admitting: Obstetrics and Gynecology

## 2013-08-01 DIAGNOSIS — Z1231 Encounter for screening mammogram for malignant neoplasm of breast: Secondary | ICD-10-CM

## 2013-08-02 ENCOUNTER — Ambulatory Visit (HOSPITAL_BASED_OUTPATIENT_CLINIC_OR_DEPARTMENT_OTHER)
Admission: RE | Admit: 2013-08-02 | Discharge: 2013-08-02 | Disposition: A | Discharge status: Home / Self Care | Payer: 59 | Source: Ambulatory Visit | Attending: Obstetrics and Gynecology | Admitting: Obstetrics and Gynecology

## 2013-08-02 ENCOUNTER — Other Ambulatory Visit: Payer: Self-pay | Admitting: Family Medicine

## 2013-08-02 DIAGNOSIS — Z1231 Encounter for screening mammogram for malignant neoplasm of breast: Secondary | ICD-10-CM

## 2013-08-13 ENCOUNTER — Other Ambulatory Visit: Payer: Self-pay | Admitting: Family Medicine

## 2013-08-13 DIAGNOSIS — R928 Other abnormal and inconclusive findings on diagnostic imaging of breast: Secondary | ICD-10-CM

## 2013-08-26 ENCOUNTER — Encounter: Payer: BC Managed Care – PPO | Admitting: Family Medicine

## 2013-09-05 ENCOUNTER — Other Ambulatory Visit: Payer: Self-pay

## 2013-09-12 ENCOUNTER — Telehealth: Payer: Self-pay

## 2013-09-12 NOTE — Telephone Encounter (Addendum)
Left message for call back  identifiable  Medication and allergies: reviewed and updated  90 day supply/mail order: na Local pharmacy: [facility] [address] [place]   Immunizations due:  Flu vaccine today  A/P:   No changes to FH or PSH  MMG--07/2013--neg CCS--03/2013--Dr Kaplan--next 2024 Endo--[facility]--last OV 07/2013  To Discuss with Provider: Handling her female care

## 2013-09-13 ENCOUNTER — Encounter: Payer: Self-pay | Admitting: Family Medicine

## 2013-09-13 ENCOUNTER — Ambulatory Visit (INDEPENDENT_AMBULATORY_CARE_PROVIDER_SITE_OTHER): Payer: 59 | Admitting: Family Medicine

## 2013-09-13 VITALS — BP 150/80 | HR 79 | Temp 97.4°F | Ht 67.75 in | Wt 218.2 lb

## 2013-09-13 DIAGNOSIS — Z0000 Encounter for general adult medical examination without abnormal findings: Secondary | ICD-10-CM

## 2013-09-13 LAB — BASIC METABOLIC PANEL
CO2: 29 mEq/L (ref 19–32)
Calcium: 9.2 mg/dL (ref 8.4–10.5)
Chloride: 100 mEq/L (ref 96–112)
Glucose, Bld: 231 mg/dL — ABNORMAL HIGH (ref 70–99)
Potassium: 3.9 mEq/L (ref 3.5–5.3)
Sodium: 137 mEq/L (ref 135–145)

## 2013-09-13 LAB — CBC WITH DIFFERENTIAL/PLATELET
Basophils Absolute: 0 10*3/uL (ref 0.0–0.1)
Eosinophils Absolute: 0.2 10*3/uL (ref 0.0–0.7)
Eosinophils Relative: 3 % (ref 0–5)
Hemoglobin: 11.5 g/dL — ABNORMAL LOW (ref 12.0–15.0)
Lymphocytes Relative: 39 % (ref 12–46)
Lymphs Abs: 1.8 10*3/uL (ref 0.7–4.0)
MCH: 29.2 pg (ref 26.0–34.0)
MCV: 88.1 fL (ref 78.0–100.0)
Monocytes Relative: 8 % (ref 3–12)
Neutro Abs: 2.2 10*3/uL (ref 1.7–7.7)
Neutrophils Relative %: 50 % (ref 43–77)
RBC: 3.94 MIL/uL (ref 3.87–5.11)

## 2013-09-13 LAB — LIPID PANEL
HDL: 43 mg/dL (ref >39)
Total CHOL/HDL Ratio: 4.5 Ratio

## 2013-09-13 LAB — HEPATIC FUNCTION PANEL
ALT: 12 U/L (ref 0–35)
AST: 12 U/L (ref 0–37)
Albumin: 3.8 g/dL (ref 3.5–5.2)
Alkaline Phosphatase: 61 U/L (ref 39–117)
Bilirubin, Direct: 0.1 mg/dL (ref 0.0–0.3)
Total Protein: 6.6 g/dL (ref 6.0–8.3)

## 2013-09-13 NOTE — Progress Notes (Signed)
  Subjective:    Patient ID: Kathleen Acevedo, female    DOB: [birth year], 52 y.o.   MRN: [id number]  HPI CPE- UTD on mammo, colonoscopy.  Seeing Endo regularly for diabetes.  No need for pap due to hysterectomy.  No concerns today.   Review of Systems Patient reports no vision/hearing changes, adenopathy,fever, weight change, persistant/recurrent hoarseness , swallowing issues, chest pain, palpitations, edema, persistant/recurrent cough, hemoptysis, dyspnea (rest/exertional/paroxysmal nocturnal), gastrointestinal bleeding (melena, rectal bleeding), abdominal pain, significant heartburn, bowel changes, GU symptoms (dysuria, hematuria, incontinence), Gyn symptoms (abnormal  bleeding, pain),  syncope, focal weakness, memory loss, numbness & tingling, skin/hair/nail changes, abnormal bruising or bleeding, anxiety, or depression.    Objective:   Physical Exam General Appearance:    Alert, cooperative, no distress, appears stated age  Head:    Normocephalic, without obvious abnormality, atraumatic  Eyes:    PERRL, conjunctiva/corneas clear, EOM's intact, fundi    benign, both eyes  Ears:    Normal TM's and external ear canals, both ears  Nose:   Nares normal, septum midline, mucosa normal, no drainage    or sinus tenderness  Throat:   Lips, mucosa, and tongue normal; teeth and gums normal  Neck:   Supple, symmetrical, trachea midline, no adenopathy;    Thyroid: no enlargement/tenderness/nodules  Back:     Symmetric, no curvature, ROM normal, no CVA tenderness  Lungs:     Clear to auscultation bilaterally, respirations unlabored  Chest Wall:    No tenderness or deformity   Heart:    Regular rate and rhythm, S1 and S2 normal, no murmur, rub   or gallop  Breast Exam:    Deferred to mammo  Abdomen:     Soft, non-tender, bowel sounds active all four quadrants,    no masses, no organomegaly  Genitalia:    Deferred  Rectal:    Extremities:   Extremities normal, atraumatic, no cyanosis or edema   Pulses:   2+ and symmetric all extremities  Skin:   Skin color, texture, turgor normal, no rashes or lesions  Lymph nodes:   Cervical, supraclavicular, and axillary nodes normal  Neurologic:   CNII-XII intact, normal strength, sensation and reflexes    throughout          Assessment & Plan:

## 2013-09-13 NOTE — Patient Instructions (Signed)
Follow up in 6 months to recheck BP and cholesterol We'll notify you of your lab results and make any changes Limit your salt intake (processed meats) Keep up the good work!  You look great! Call with any questions or concerns Happy Holidays!!!

## 2013-09-13 NOTE — Assessment & Plan Note (Signed)
Pt's PE WNL.  UTD on health maintenance and endo.  Check labs.  Anticipatory guidance provided.

## 2013-09-13 NOTE — Progress Notes (Signed)
Pre visit review using our clinic review tool, if applicable. No additional management support is needed unless otherwise documented below in the visit note. 

## 2013-09-14 LAB — TSH: TSH: 1.297 u[IU]/mL (ref 0.350–4.500)

## 2013-09-17 ENCOUNTER — Ambulatory Visit
Admission: RE | Admit: 2013-09-17 | Discharge: 2013-09-17 | Disposition: A | Discharge status: Home / Self Care | Payer: 59 | Source: Ambulatory Visit | Attending: Family Medicine | Admitting: Family Medicine

## 2013-09-17 ENCOUNTER — Other Ambulatory Visit: Payer: Self-pay | Admitting: Family Medicine

## 2013-09-17 DIAGNOSIS — R921 Mammographic calcification found on diagnostic imaging of breast: Secondary | ICD-10-CM

## 2013-09-17 DIAGNOSIS — R928 Other abnormal and inconclusive findings on diagnostic imaging of breast: Secondary | ICD-10-CM

## 2013-09-19 ENCOUNTER — Ambulatory Visit
Admission: RE | Admit: 2013-09-19 | Discharge: 2013-09-19 | Disposition: A | Discharge status: Home / Self Care | Payer: 59 | Source: Ambulatory Visit | Attending: Family Medicine | Admitting: Family Medicine

## 2013-09-19 DIAGNOSIS — R921 Mammographic calcification found on diagnostic imaging of breast: Secondary | ICD-10-CM

## 2013-09-19 HISTORY — PX: BREAST BIOPSY: SHX20

## 2013-09-27 ENCOUNTER — Encounter: Payer: BC Managed Care – PPO | Admitting: Family Medicine

## 2013-10-01 ENCOUNTER — Encounter: Payer: Self-pay | Admitting: Family Medicine

## 2013-10-21 ENCOUNTER — Ambulatory Visit (INDEPENDENT_AMBULATORY_CARE_PROVIDER_SITE_OTHER): Payer: 59 | Admitting: Family Medicine

## 2013-10-21 ENCOUNTER — Encounter: Payer: Self-pay | Admitting: Family Medicine

## 2013-10-21 VITALS — BP 128/80 | HR 82 | Temp 98.7°F | Resp 16 | Wt 212.5 lb

## 2013-10-21 DIAGNOSIS — J019 Acute sinusitis, unspecified: Secondary | ICD-10-CM

## 2013-10-21 MED ORDER — PROMETHAZINE-DM 6.25-15 MG/5ML PO SYRP: 5.0000 mL | ORAL_SOLUTION | Freq: Four times a day (QID) | ORAL | Status: DC | PRN

## 2013-10-21 MED ORDER — AMOXICILLIN 875 MG PO TABS: 875.0000 mg | ORAL_TABLET | Freq: Two times a day (BID) | ORAL | Status: DC

## 2013-10-21 NOTE — Progress Notes (Signed)
   Subjective:    Patient ID: Kathleen Acevedo, female    DOB: 06-15-1961, 52 y.o.   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  sxs started Thursday w/ sore throat and sinus pressure.  No fevers.  + facial pain.  Bilateral ear fullness.  + nausea, no vomiting or diarrhea.  + cough- nonproductive, 'dry hacking'.   Review of Systems For ROS see HPI     Objective:   Physical Exam  Vitals reviewed. Constitutional: She appears well-developed and well-nourished. No distress.  HENT:  Head: Normocephalic and atraumatic.  Right Ear: Tympanic membrane normal.  Left Ear: Tympanic membrane normal.  Nose: Mucosal edema and rhinorrhea present. Right sinus exhibits maxillary sinus tenderness and frontal sinus tenderness. Left sinus exhibits maxillary sinus tenderness and frontal sinus tenderness.  Mouth/Throat: Uvula is midline and mucous membranes are normal. Posterior oropharyngeal erythema present. No oropharyngeal exudate.  Eyes: Conjunctivae and EOM are normal. Pupils are equal, round, and reactive to light.  Neck: Normal range of motion. Neck supple.  Cardiovascular: Normal rate, regular rhythm and normal heart sounds.   Pulmonary/Chest: Effort normal and breath sounds normal. No respiratory distress. She has no wheezes.  Lymphadenopathy:    She has no cervical adenopathy.          Assessment & Plan:

## 2013-10-21 NOTE — Patient Instructions (Signed)
Follow up as scheduled Start the Amox twice daily- take w/ food Drink plenty of fluids REST! Take the cough syrup as needed- will cause drowsiness Call with any questions or concerns Happy Holidays!

## 2013-10-21 NOTE — Assessment & Plan Note (Signed)
sxs and PE consistent w/ infxn.  Start abx.  Cough meds prn.  Reviewed supportive care and red flags that should prompt return.  Pt expressed understanding and is in agreement w/ plan.  

## 2013-10-21 NOTE — Progress Notes (Signed)
Pre visit review using our clinic review tool, if applicable. No additional management support is needed unless otherwise documented below in the visit note. 

## 2013-11-26 ENCOUNTER — Telehealth: Payer: Self-pay | Admitting: *Deleted

## 2013-11-26 NOTE — Telephone Encounter (Signed)
Patient was seen on December 22nd for sinuses and she states that she still has this hacking cough. Patient wants to see if dr Birdie Riddle can prescribe something else for her or suggest something over the counter.

## 2013-11-26 NOTE — Telephone Encounter (Signed)
Pt can try delsym OTC, but since it has been a month really should be seen.

## 2013-11-29 ENCOUNTER — Encounter: Payer: Self-pay | Admitting: Family Medicine

## 2013-11-29 ENCOUNTER — Ambulatory Visit (INDEPENDENT_AMBULATORY_CARE_PROVIDER_SITE_OTHER): Payer: 59 | Admitting: Family Medicine

## 2013-11-29 VITALS — BP 138/78 | HR 76 | Temp 98.1°F | Resp 16 | Wt 218.4 lb

## 2013-11-29 DIAGNOSIS — J9801 Acute bronchospasm: Secondary | ICD-10-CM

## 2013-11-29 DIAGNOSIS — J45991 Cough variant asthma: Secondary | ICD-10-CM

## 2013-11-29 MED ORDER — FLUTICASONE PROPIONATE 50 MCG/ACT NA SUSP: 2.0000 | Freq: Every day | NASAL | Status: DC

## 2013-11-29 MED ORDER — PROMETHAZINE-DM 6.25-15 MG/5ML PO SYRP: 5.0000 mL | ORAL_SOLUTION | Freq: Four times a day (QID) | ORAL | Status: DC | PRN

## 2013-11-29 MED ORDER — ALBUTEROL SULFATE (2.5 MG/3ML) 0.083% IN NEBU
2.5000 mg | INHALATION_SOLUTION | Freq: Once | RESPIRATORY_TRACT | Status: AC
  Administered 2013-11-29: 2.5 mg via RESPIRATORY_TRACT

## 2013-11-29 MED ORDER — ALBUTEROL SULFATE HFA 108 (90 BASE) MCG/ACT IN AERS: 2.0000 | INHALATION_SPRAY | RESPIRATORY_TRACT | Status: DC | PRN

## 2013-11-29 NOTE — Patient Instructions (Signed)
Follow up as needed Start Claritin or Zyrtec OTC daily Use the Flonase- 2 sprays each nostril daily Drink plenty of fluids Use the inhaler as needed for chest tightness, shortness of breath, or coughing fits Use the cough syrup for nights- will cause drowsiness Call with any questions or concerns- particularly if not getting better Hang in there!!!

## 2013-11-29 NOTE — Progress Notes (Signed)
Pre visit review using our clinic review tool, if applicable. No additional management support is needed unless otherwise documented below in the visit note. 

## 2013-11-29 NOTE — Progress Notes (Signed)
   Subjective:    Patient ID: Kathleen Acevedo, female    DOB: 10-26-1961, 53 y.o.   MRN: [id number]  HPI Pt continues to have deep cough- now urinating w/ cough- hoarseness, now painful.  Denies nasal congestion.  No facial pain/pressure- sinuses improved after tx in Dec.  No known hx of seasonal allergies.   Review of Systems For ROS see HPI     Objective:   Physical Exam  Vitals reviewed. Constitutional: She appears well-developed and well-nourished. No distress.  HENT:  Head: Normocephalic and atraumatic.  TMs normal bilaterally Mild nasal congestion Throat w/out erythema, edema, or exudate Obvious hoarseness  Eyes: Conjunctivae and EOM are normal. Pupils are equal, round, and reactive to light.  Neck: Normal range of motion. Neck supple.  Cardiovascular: Normal rate, regular rhythm, normal heart sounds and intact distal pulses.   No murmur heard. Pulmonary/Chest: Effort normal and breath sounds normal. No respiratory distress. She has no wheezes.  + hacking cough  Lymphadenopathy:    She has no cervical adenopathy.          Assessment & Plan:

## 2013-12-01 NOTE — Assessment & Plan Note (Addendum)
New.  Suspect pt's cough is a version of cough variant asthma.  Pt felt that chest tightness improved s/p neb tx.  Start OTC antihistamine, nasal steroid.  Albuterol inhaler prn.  Reviewed supportive care and red flags that should prompt return.  Pt expressed understanding and is in agreement w/ plan.

## 2013-12-02 ENCOUNTER — Ambulatory Visit: Payer: 59 | Admitting: Family Medicine

## 2013-12-06 ENCOUNTER — Encounter: Payer: Self-pay | Admitting: Family Medicine

## 2013-12-06 ENCOUNTER — Telehealth: Payer: Self-pay | Admitting: Family Medicine

## 2013-12-06 MED ORDER — GUAIFENESIN-CODEINE 100-10 MG/5ML PO SYRP: ORAL_SOLUTION | ORAL | Status: DC

## 2013-12-06 NOTE — Telephone Encounter (Signed)
cheratussin ac  158ml  1-2 tsp po qhs prn

## 2013-12-06 NOTE — Telephone Encounter (Signed)
Please advise pt was last seen on 11/29/13 for bronchospasms and cough variant asthma.

## 2013-12-06 NOTE — Telephone Encounter (Signed)
promethazine-dextromethorphan (PROMETHAZINE-DM) 6.25-15 MG/5ML syrup Patient called and stated that the above cough medicine is on back order. She would like something else sent in if possible. JG//CMA

## 2013-12-06 NOTE — Telephone Encounter (Signed)
Med filled.  

## 2013-12-06 NOTE — Telephone Encounter (Signed)
Pt notified rx is available for pick up. Is on the way to get it.

## 2013-12-10 ENCOUNTER — Encounter: Payer: Self-pay | Admitting: Family Medicine

## 2014-01-10 ENCOUNTER — Telehealth: Payer: Self-pay | Admitting: *Deleted

## 2014-01-10 ENCOUNTER — Encounter: Payer: Self-pay | Admitting: Family Medicine

## 2014-01-10 ENCOUNTER — Ambulatory Visit (INDEPENDENT_AMBULATORY_CARE_PROVIDER_SITE_OTHER): Payer: 59 | Admitting: Family Medicine

## 2014-01-10 VITALS — BP 130/68 | HR 71 | Temp 98.0°F | Resp 16 | Wt 221.1 lb

## 2014-01-10 DIAGNOSIS — H6690 Otitis media, unspecified, unspecified ear: Secondary | ICD-10-CM | POA: Insufficient documentation

## 2014-01-10 DIAGNOSIS — J019 Acute sinusitis, unspecified: Secondary | ICD-10-CM

## 2014-01-10 MED ORDER — AMOXICILLIN 875 MG PO TABS: 875.0000 mg | ORAL_TABLET | Freq: Two times a day (BID) | ORAL | Status: AC

## 2014-01-10 NOTE — Patient Instructions (Signed)
Follow up as needed Start the Amoxicillin twice daily- take w/ food Drink plenty of fluids REST! Call with any questions or concerns Hang in there!!! 

## 2014-01-10 NOTE — Telephone Encounter (Signed)
Called patient back to review current symptoms.

## 2014-01-10 NOTE — Telephone Encounter (Signed)
Patient called and stated that she is having sinus issues and would like a call back. She is currently taking flonase and would like to increase the dose if possible or start something else along with Flonase. Please advice. JG//CMA

## 2014-01-10 NOTE — Telephone Encounter (Signed)
Was here a month ago.  Has sinus issues.  Prescribed Flonase.  Symptoms got better, but have come back.  No nasal drainage.  Left ear stopped up.   Patient stated that she was on her way here and wants to know if she can be seen.  Dr. Birdie Riddle made aware.

## 2014-01-10 NOTE — Progress Notes (Signed)
Pre visit review using our clinic review tool, if applicable. No additional management support is needed unless otherwise documented below in the visit note. 

## 2014-01-10 NOTE — Progress Notes (Signed)
   Subjective:    Patient ID: Kathleen Acevedo, female    DOB: 09-11-1961, 53 y.o.   MRN: [id number]  Sinusitis   URI- sxs started 2 weeks ago.  + facial pain/pressure.  L ear pain.  No fever.  No drainage from ear.  + muffled hearing.  No tooth pain.  No sore throat.  Mild cough.  No N/V/D.  + sick contacts at work.   Review of Systems For ROS see HPI     Objective:   Physical Exam  Vitals reviewed. Constitutional: She appears well-developed and well-nourished. No distress.  HENT:  Head: Normocephalic and atraumatic.  Right Ear: Tympanic membrane normal.  Left Ear: Tympanic membrane is injected, erythematous and bulging.  Nose: Mucosal edema and rhinorrhea present. Right sinus exhibits frontal sinus tenderness. Right sinus exhibits no maxillary sinus tenderness. Left sinus exhibits frontal sinus tenderness. Left sinus exhibits no maxillary sinus tenderness.  Mouth/Throat: Uvula is midline and mucous membranes are normal. Posterior oropharyngeal erythema present. No oropharyngeal exudate.  Eyes: Conjunctivae and EOM are normal. Pupils are equal, round, and reactive to light.  Neck: Normal range of motion. Neck supple.  Cardiovascular: Normal rate, regular rhythm and normal heart sounds.   Pulmonary/Chest: Effort normal and breath sounds normal. No respiratory distress. She has no wheezes.  Lymphadenopathy:    She has no cervical adenopathy.          Assessment & Plan:

## 2014-01-11 NOTE — Assessment & Plan Note (Signed)
New.  Start abx.  Reviewed supportive care and red flags that should prompt return.  Pt expressed understanding and is in agreement w/ plan.  

## 2014-01-11 NOTE — Assessment & Plan Note (Signed)
Pt's sxs and PE consistent w/ infxn.  Start abx.  Reviewed supportive care and red flags that should prompt return.  Pt expressed understanding and is in agreement w/ plan.  

## 2014-02-06 ENCOUNTER — Other Ambulatory Visit: Payer: Self-pay

## 2014-05-27 ENCOUNTER — Telehealth: Payer: Self-pay

## 2014-05-27 NOTE — Telephone Encounter (Signed)
LVM for pt to call back to schedule cPE with PCP   Diabetic bundle pt.

## 2014-06-23 ENCOUNTER — Encounter: Payer: Self-pay | Admitting: General Practice

## 2014-08-13 ENCOUNTER — Other Ambulatory Visit: Payer: Self-pay

## 2014-08-13 DIAGNOSIS — Z1231 Encounter for screening mammogram for malignant neoplasm of breast: Secondary | ICD-10-CM

## 2014-08-29 LAB — HEMOGLOBIN A1C: Hgb A1c MFr Bld: 8.3 % — AB (ref 4.0–6.0)

## 2014-09-02 ENCOUNTER — Encounter: Payer: Self-pay | Admitting: General Practice

## 2014-09-08 ENCOUNTER — Ambulatory Visit
Admission: RE | Admit: 2014-09-08 | Discharge: 2014-09-08 | Disposition: A | Discharge status: Home / Self Care | Payer: 59 | Source: Ambulatory Visit

## 2014-09-08 DIAGNOSIS — Z1231 Encounter for screening mammogram for malignant neoplasm of breast: Secondary | ICD-10-CM

## 2014-09-09 ENCOUNTER — Other Ambulatory Visit: Payer: Self-pay | Admitting: Family Medicine

## 2014-09-09 DIAGNOSIS — R928 Other abnormal and inconclusive findings on diagnostic imaging of breast: Secondary | ICD-10-CM

## 2014-09-10 ENCOUNTER — Ambulatory Visit
Admission: RE | Admit: 2014-09-10 | Discharge: 2014-09-10 | Disposition: A | Discharge status: Home / Self Care | Payer: 59 | Source: Ambulatory Visit | Attending: Family Medicine | Admitting: Family Medicine

## 2014-09-10 DIAGNOSIS — R928 Other abnormal and inconclusive findings on diagnostic imaging of breast: Secondary | ICD-10-CM

## 2014-09-18 ENCOUNTER — Encounter: Payer: 59 | Admitting: Family Medicine

## 2014-11-24 ENCOUNTER — Encounter: Payer: Self-pay | Admitting: Medical

## 2014-11-24 ENCOUNTER — Ambulatory Visit (INDEPENDENT_AMBULATORY_CARE_PROVIDER_SITE_OTHER): Payer: 59 | Admitting: Medical

## 2014-11-24 VITALS — BP 124/77 | HR 75 | Temp 97.7°F | Ht 68.0 in | Wt 224.8 lb

## 2014-11-24 DIAGNOSIS — J329 Chronic sinusitis, unspecified: Secondary | ICD-10-CM | POA: Insufficient documentation

## 2014-11-24 DIAGNOSIS — J0100 Acute maxillary sinusitis, unspecified: Secondary | ICD-10-CM

## 2014-11-24 MED ORDER — BECLOMETHASONE DIPROPIONATE 40 MCG/ACT IN AERS: 2.0000 | INHALATION_SPRAY | Freq: Two times a day (BID) | RESPIRATORY_TRACT | Status: DC

## 2014-11-24 MED ORDER — ALBUTEROL SULFATE HFA 108 (90 BASE) MCG/ACT IN AERS: 2.0000 | INHALATION_SPRAY | Freq: Four times a day (QID) | RESPIRATORY_TRACT | Status: DC | PRN

## 2014-11-24 MED ORDER — HYDROCODONE-HOMATROPINE 5-1.5 MG/5ML PO SYRP: 5.0000 mL | ORAL_SOLUTION | Freq: Three times a day (TID) | ORAL | Status: DC | PRN

## 2014-11-24 MED ORDER — AMOXICILLIN-POT CLAVULANATE 875-125 MG PO TABS: 1.0000 | ORAL_TABLET | Freq: Two times a day (BID) | ORAL | Status: DC

## 2014-11-24 NOTE — Progress Notes (Signed)
Subjective:    Patient ID: Kathleen Acevedo, female    DOB: Aug 26, 1961, 54 y.o.   MRN: [id number]  HPI   Pt in today reporting primarily cough, nasal congestion and runny nose for 5  Days.  Associated symptoms( below yes or no)  Fever-no Chills-yes Chest congestion-yes Sneezing- yes Itching eyes- Sore throat-  Post-nasal drainage- Wheezing-yes. No hx of asthma. None smoker. Purulent drainage- Fatigue-yes  Pt husband was sick but he is better.  Pt just got flonase refilled.    Past Medical History  Diagnosis Date  . Diabetes mellitus   . Hyperlipidemia   . Hypertension     History   Social History  . Marital Status: Married    Spouse Name: N/A    Number of Children: N/A  . Years of Education: N/A   Occupational History  . Not on file.   Social History Main Topics  . Smoking status: Never Smoker   . Smokeless tobacco: Never Used  . Alcohol Use: No  . Drug Use: No  . Sexual Activity: Not on file   Other Topics Concern  . Not on file   Social History Narrative    Past Surgical History  Procedure Laterality Date  . Abdominal hysterectomy    . Oophorectomy      Family History  Problem Relation Age of Onset  . Diabetes Mother   . Colon cancer Neg Hx   . Colon polyps Neg Hx     No Known Allergies  Current Outpatient Prescriptions on File Prior to Visit  Medication Sig Dispense Refill  . Ferrous Sulfate (IRON) 325 (65 FE) MG TABS Take by mouth daily.      [name] glucose blood (ONE TOUCH ULTRA TEST) test strip 1 each by Other route 2 (two) times daily. Use as instructed     . insulin glargine (LANTUS) 100 UNIT/ML injection Inject 10 Units into the skin 2 (two) times daily.     [name] lisinopril-hydrochlorothiazide (PRINZIDE,ZESTORETIC) 20-12.5 MG per tablet Take 1 tablet by mouth daily.    . metFORMIN (GLUCOPHAGE) 1000 MG tablet Take 1 tablet (1,000 mg total) by mouth 2 (two) times daily. 60 tablet 0  . Multiple Vitamin (MULTIVITAMIN PO) Take by mouth  daily.      Glory Rosebush DELICA LANCETS MISC by Does not apply route 2 (two) times daily.      [name] VICTOZA 18 MG/3ML SOLN Inject 1.8 mg into the skin daily.     [name] albuterol (PROAIR HFA) 108 (90 BASE) MCG/ACT inhaler Inhale 2 puffs into the lungs every 4 (four) hours as needed for wheezing or shortness of breath. (Patient not taking: Reported on 11/24/2014) 1 Inhaler 6  . fluticasone (FLONASE) 50 MCG/ACT nasal spray Place 2 sprays into both nostrils daily. (Patient not taking: Reported on 11/24/2014) 16 g 6   No current facility-administered medications on file prior to visit.    BP 124/77 mmHg  Pulse 75  Temp(Src) 97.7 F (36.5 C) (Oral)  Ht 5\' 8"  (1.727 m)  Wt 224 lb 12.8 oz (101.969 kg)  BMI 34.19 kg/m2  SpO2 100%       Review of Systems  Constitutional: Positive for chills and fatigue. Negative for fever.  HENT: Positive for postnasal drip, sinus pressure, sneezing and sore throat.   Respiratory: Positive for cough and wheezing. Negative for chest tightness and shortness of breath.   Cardiovascular: Negative for chest pain and palpitations.  Musculoskeletal: Negative for back pain.  Neurological: Negative for dizziness,  syncope, facial asymmetry, light-headedness, numbness and headaches.  Hematological: Negative for adenopathy. Does not bruise/bleed easily.       Objective:   Physical Exam   General  Mental Status - Alert. General Appearance - Well groomed. Not in acute distress.  Skin Rashes- No Rashes.  HEENT Head- Normal. Ear Auditory Canal - Left- Normal. Right - Normal.Tympanic Membrane- Left- red Right- Normal. Eye Sclera/Conjunctiva- Left- Normal. Right- Normal. Nose & Sinuses Nasal Mucosa- Left-  Boggy and Congested. Right-  Boggy and  Congested.Bilateral maxillary and frontal sinus pressure. Mouth & Throat Lips: Upper Lip- Normal: no dryness, cracking, pallor, cyanosis, or vesicular eruption. Lower Lip-Normal: no dryness, cracking, pallor, cyanosis or  vesicular eruption. Buccal Mucosa- Bilateral- No Aphthous ulcers. Oropharynx- No Discharge or Erythema. Tonsils: Characteristics- Bilateral- No Erythema or Congestion. Size/Enlargement- Bilateral- No enlargement. Discharge- bilateral-None.  Neck Neck- Supple. No Masses.   Chest and Lung Exam Auscultation: Breath Sounds:-Clear even and unlabored.  Cardiovascular Auscultation:Rythm- Regular, rate and rhythm. Murmurs & Other Heart Sounds:Ausculatation of the heart reveal- No Murmurs.  Lymphatic Head & Neck General Head & Neck Lymphatics: Bilateral: Description- No Localized lymphadenopathy.        Assessment & Plan:

## 2014-11-24 NOTE — Patient Instructions (Signed)
Your appear to have a sinus infection. I am prescribing augmentin  antibiotic for the infection. To help with the nasal congestion use your flonase  nasal steroid. For your associated cough, I prescribed cough medicine hydromet.  If you have wheezing, I am making qvar and albuterol available.  Rest, hydrate, tylenol for fever.  Follow up in 7 days or as needed.

## 2014-11-24 NOTE — Assessment & Plan Note (Signed)
Your appear to have a sinus infection. I am prescribing augmentin  antibiotic for the infection. To help with the nasal congestion use your flonase  nasal steroid. For your associated cough, I prescribed cough medicine hydromet.  If you have wheezing, I am making qvar and albuterol available.  Rest, hydrate, tylenol for fever.

## 2014-11-24 NOTE — Progress Notes (Signed)
Pre visit review using our clinic review tool, if applicable. No additional management support is needed unless otherwise documented below in the visit note. 

## 2014-12-23 ENCOUNTER — Encounter: Payer: Self-pay | Admitting: Family Medicine

## 2014-12-23 ENCOUNTER — Ambulatory Visit (INDEPENDENT_AMBULATORY_CARE_PROVIDER_SITE_OTHER): Payer: 59 | Admitting: Family Medicine

## 2014-12-23 VITALS — BP 124/82 | HR 76 | Temp 97.9°F | Resp 16 | Wt 219.5 lb

## 2014-12-23 DIAGNOSIS — B351 Tinea unguium: Secondary | ICD-10-CM

## 2014-12-23 NOTE — Assessment & Plan Note (Signed)
Deteriorated.  Pt's nail is now lifting off the nail bed.  Refer to podiatry for possible excision and tx.  No evidence of infected ingrown nail that would benefit from abx at this time.  Pt expressed understanding and is in agreement w/ plan.

## 2014-12-23 NOTE — Patient Instructions (Signed)
Follow up as scheduled Try and keep nail clean and dry Avoid tight or pointy shoes Call with any questions or concerns Hang in there!

## 2014-12-23 NOTE — Progress Notes (Signed)
Pre visit review using our clinic review tool, if applicable. No additional management support is needed unless otherwise documented below in the visit note. 

## 2014-12-23 NOTE — Progress Notes (Signed)
   Subjective:    Patient ID: Kathleen Acevedo, female    DOB: 02-09-1961, 54 y.o.   MRN: [id number]  HPI Nail fungus- L great toe nail is hyperpigmented, lifting off the nail bed, thickened.  Some discomfort as nail hits shoe when walking.    Review of Systems For ROS see HPI     Objective:   Physical Exam  Constitutional: She appears well-developed and well-nourished. No distress.  Cardiovascular: Intact distal pulses.   Skin: Skin is warm and dry.  L great toenail hyperpigmented, thickened, and lifting from nail base- consistent w/ extensive fungal infxn  Vitals reviewed.         Assessment & Plan:

## 2014-12-26 ENCOUNTER — Ambulatory Visit (INDEPENDENT_AMBULATORY_CARE_PROVIDER_SITE_OTHER): Payer: 59 | Admitting: Podiatry

## 2014-12-26 ENCOUNTER — Encounter: Payer: Self-pay | Admitting: Podiatry

## 2014-12-26 VITALS — BP 122/65 | HR 74 | Ht 68.0 in | Wt 219.0 lb

## 2014-12-26 DIAGNOSIS — L97501 Non-pressure chronic ulcer of other part of unspecified foot limited to breakdown of skin: Secondary | ICD-10-CM

## 2014-12-26 DIAGNOSIS — M2040 Other hammer toe(s) (acquired), unspecified foot: Secondary | ICD-10-CM | POA: Insufficient documentation

## 2014-12-26 DIAGNOSIS — M779 Enthesopathy, unspecified: Secondary | ICD-10-CM

## 2014-12-26 DIAGNOSIS — B351 Tinea unguium: Secondary | ICD-10-CM

## 2014-12-26 DIAGNOSIS — M6240 Contracture of muscle, unspecified site: Secondary | ICD-10-CM

## 2014-12-26 NOTE — Progress Notes (Signed)
Noted 2 weeks ago left great toe nail is coming off. Walk a lot at work and also walk for exercise. Diabetic over 20 years under control.   Review of Systems - General ROS: Negative except for diabetes.   Objective: Dermatologic: Deformed thick nail raised from nail bed left great toe. Excess callus formation at distal 1/2 of nail bed left hallux with small spot of subungual drainage left hallux without edema or erythema.  Digital corns 5th digits bilateral. Neurologic: Normal findings except for left hallux that failed to respond to monofilament sensory testing.  Vascular: All pedal pulses are palpable and abnormal findings.  Orthopedic: Hyperextended left hallux with tight Extensohalluces longus tendon.  Enlarged phalanx with contracture and digital corn 5th bilateral. Contracted and hammered 2nd digit right. Mild bunion bilateral. Normal ankle joint motion left, tight Achilles tendon right.  Radiographic examination reveal; AP view show rectus type foot with short first metatarsal, enlarged phalangeal head 5th digit bilateral. Lateral view show contracted lesser digits, bone spurs at distal dorsal part of distal phalanx left hallux, plantar and posterior heel, and sagging of midtarsal bone bilateral.   Assessment:  1. Ankle Equinus right. 2. Chronic micro trauma to left hallux due to hyper extended left hallux with tight EHL tendon. 3. Deformed nail with fungal infection left hallux from chronic microtrauma. 4. Excess subungual callus with focal drainage left hallux.  5. Multiple Hammer toe deformities; 2nd and 5th bilateral. 6. Forefoot varus with short first Metatarsal bilateral.  Plan: Reviewed findings and available treatment options.  Reviewed the benefit of tendon lengthening of the contracted EHL and Hammer toe repair on 5th digit left. Left hallucal nail and underlying calluses debrided. Drained subungual lesion and Betadine dressing applied. Patient is to keep the area  clean and cleanse with Betadine or Peroxide.  Start doing stretch exercise for tight Achilles tendon on right side.  Patient reviewed consent form for EHL lengthening and hammer toe repair 5th digit left foot.

## 2014-12-26 NOTE — Patient Instructions (Signed)
Seen for painful left great toe. Reviewed findings and debrided. Consent form reviewed for EHL lengthening and Hammer toe repair 5th left foot.

## 2015-01-05 ENCOUNTER — Telehealth: Payer: Self-pay | Admitting: Family Medicine

## 2015-01-05 NOTE — Telephone Encounter (Signed)
Ok to put 2 afternoon appts together.  No preference on day.

## 2015-01-05 NOTE — Telephone Encounter (Signed)
PT has CPE scheduled 3/14- was able to get into a foot specialist same day. Can I combine 2 of your 15 minute slots to make a cpe ? If so which do you prefer?

## 2015-01-10 LAB — HM DIABETES FOOT EXAM: HM Diabetic Foot Exam: NORMAL

## 2015-01-12 ENCOUNTER — Encounter: Payer: 59 | Admitting: Family Medicine

## 2015-01-13 ENCOUNTER — Telehealth: Payer: Self-pay | Admitting: *Deleted

## 2015-01-13 ENCOUNTER — Encounter: Payer: Self-pay | Admitting: *Deleted

## 2015-01-13 NOTE — Telephone Encounter (Signed)
Pre-Visit Call completed with patient and chart updated.   Pre-Visit Info documented in Specialty Comments under [employer].    

## 2015-01-14 ENCOUNTER — Ambulatory Visit (INDEPENDENT_AMBULATORY_CARE_PROVIDER_SITE_OTHER): Payer: 59 | Admitting: Family Medicine

## 2015-01-14 ENCOUNTER — Encounter: Payer: Self-pay | Admitting: Family Medicine

## 2015-01-14 VITALS — BP 122/88 | HR 72 | Temp 98.1°F | Resp 16 | Ht 67.5 in | Wt 222.5 lb

## 2015-01-14 DIAGNOSIS — Z0000 Encounter for general adult medical examination without abnormal findings: Secondary | ICD-10-CM | POA: Diagnosis not present

## 2015-01-14 NOTE — Patient Instructions (Signed)
Follow up in 6 months to recheck BP and cholesterol We'll notify you of your lab results and make any changes if needed Keep up the good work on healthy diet and regular exercise- you look great! Call with any questions or concerns Happy Spring!!!

## 2015-01-14 NOTE — Progress Notes (Signed)
Pre visit review using our clinic review tool, if applicable. No additional management support is needed unless otherwise documented below in the visit note. 

## 2015-01-14 NOTE — Progress Notes (Signed)
   Subjective:    Patient ID: Kathleen Acevedo, female    DOB: [birth year], 54 y.o.   MRN: [id number]  HPI CPE- UTD on colonoscopy, mammo.  Due for eye exam- pt plans to schedule.   Review of Systems Patient reports no vision/ hearing changes, adenopathy,fever, weight change,  persistant/recurrent hoarseness , swallowing issues, chest pain, palpitations, edema, persistant/recurrent cough, hemoptysis, dyspnea (rest/exertional/paroxysmal nocturnal), gastrointestinal bleeding (melena, rectal bleeding), abdominal pain, significant heartburn, bowel changes, GU symptoms (dysuria, hematuria, incontinence), Gyn symptoms (abnormal  bleeding, pain),  syncope, focal weakness, memory loss, numbness & tingling, skin/hair changes, abnormal bruising or bleeding, anxiety, or depression.  + L great toenail fungus    Objective:   Physical Exam General Appearance:    Alert, cooperative, no distress, appears stated age  Head:    Normocephalic, without obvious abnormality, atraumatic  Eyes:    PERRL, conjunctiva/corneas clear, EOM's intact, fundi    benign, both eyes  Ears:    Normal TM's and external ear canals, both ears  Nose:   Nares normal, septum midline, mucosa normal, no drainage    or sinus tenderness  Throat:   Lips, mucosa, and tongue normal; teeth and gums normal  Neck:   Supple, symmetrical, trachea midline, no adenopathy;    Thyroid: no enlargement/tenderness/nodules  Back:     Symmetric, no curvature, ROM normal, no CVA tenderness  Lungs:     Clear to auscultation bilaterally, respirations unlabored  Chest Wall:    No tenderness or deformity   Heart:    Regular rate and rhythm, S1 and S2 normal, no murmur, rub   or gallop  Breast Exam:    Deferred to mammo  Abdomen:     Soft, non-tender, bowel sounds active all four quadrants,    no masses, no organomegaly  Genitalia:    Deferred   Rectal:    Extremities:   Extremities normal, atraumatic, no cyanosis or edema  Pulses:   2+ and symmetric all  extremities  Skin:   Skin color, texture, turgor normal, no rashes or lesions  Lymph nodes:   Cervical, supraclavicular, and axillary nodes normal  Neurologic:   CNII-XII intact, normal strength, sensation and reflexes    throughout          Assessment & Plan:

## 2015-01-15 LAB — CBC WITH DIFFERENTIAL/PLATELET
Basophils Absolute: 0 10*3/uL (ref 0.0–0.1)
Basophils Relative: 0.3 % (ref 0.0–3.0)
EOS ABS: 0.2 10*3/uL (ref 0.0–0.7)
EOS PCT: 4.2 % (ref 0.0–5.0)
HCT: 32.8 % — ABNORMAL LOW (ref 36.0–46.0)
Hemoglobin: 11.1 g/dL — ABNORMAL LOW (ref 12.0–15.0)
LYMPHS ABS: 1.6 10*3/uL (ref 0.7–4.0)
Lymphocytes Relative: 32.8 % (ref 12.0–46.0)
MCHC: 33.7 g/dL (ref 30.0–36.0)
MCV: 86.8 fl (ref 78.0–100.0)
MONO ABS: 0.2 10*3/uL (ref 0.1–1.0)
Monocytes Relative: 4.4 % (ref 3.0–12.0)
NEUTROS ABS: 2.9 10*3/uL (ref 1.4–7.7)
Neutrophils Relative %: 58.3 % (ref 43.0–77.0)
Platelets: 279 10*3/uL (ref 150.0–400.0)
RBC: 3.78 Mil/uL — AB (ref 3.87–5.11)
RDW: 14.3 % (ref 11.5–15.5)
WBC: 4.9 10*3/uL (ref 4.0–10.5)

## 2015-01-15 LAB — HEPATIC FUNCTION PANEL
ALT: 14 U/L (ref 0–35)
AST: 15 U/L (ref 0–37)
Albumin: 3.9 g/dL (ref 3.5–5.2)
Alkaline Phosphatase: 66 U/L (ref 39–117)
Bilirubin, Direct: 0 mg/dL (ref 0.0–0.3)
Total Bilirubin: 0.3 mg/dL (ref 0.2–1.2)
Total Protein: 6.6 g/dL (ref 6.0–8.3)

## 2015-01-15 LAB — LIPID PANEL
CHOLESTEROL: 188 mg/dL (ref 0–200)
HDL: 44.5 mg/dL (ref >39.00)
LDL Cholesterol: 105 mg/dL — ABNORMAL HIGH (ref 0–99)
NonHDL: 143.5
TRIGLYCERIDES: 193 mg/dL — AB (ref 0.0–149.0)
Total CHOL/HDL Ratio: 4
VLDL: 38.6 mg/dL (ref 0.0–40.0)

## 2015-01-15 LAB — BASIC METABOLIC PANEL
BUN: 23 mg/dL (ref 6–23)
CO2: 31 mEq/L (ref 19–32)
Calcium: 9.4 mg/dL (ref 8.4–10.5)
Chloride: 101 mEq/L (ref 96–112)
Creatinine, Ser: 1.03 mg/dL (ref 0.40–1.20)
GFR: 71.42 mL/min (ref >60.00)
GLUCOSE: 234 mg/dL — AB (ref 70–99)
POTASSIUM: 4.2 meq/L (ref 3.5–5.1)
SODIUM: 137 meq/L (ref 135–145)

## 2015-01-15 LAB — TSH: TSH: 2.02 u[IU]/mL (ref 0.35–4.50)

## 2015-01-15 LAB — VITAMIN D 25 HYDROXY (VIT D DEFICIENCY, FRACTURES): VITD: 19.35 ng/mL — AB (ref 30.00–100.00)

## 2015-01-16 ENCOUNTER — Other Ambulatory Visit: Payer: Self-pay | Admitting: General Practice

## 2015-01-16 MED ORDER — VITAMIN D (ERGOCALCIFEROL) 1.25 MG (50000 UNIT) PO CAPS: 50000.0000 [IU] | ORAL_CAPSULE | ORAL | Status: DC

## 2015-01-18 NOTE — Assessment & Plan Note (Signed)
Pt's PE WNL w/ exception of L great toenail fungus (seeing podiatry).  UTD on mammo, colonoscopy.  Check labs.  Anticipatory guidance provided.

## 2015-01-27 ENCOUNTER — Encounter: Payer: 59 | Admitting: Podiatry

## 2015-03-17 ENCOUNTER — Telehealth: Payer: Self-pay | Admitting: Family Medicine

## 2015-03-17 DIAGNOSIS — E1165 Type 2 diabetes mellitus with hyperglycemia: Secondary | ICD-10-CM

## 2015-03-17 DIAGNOSIS — IMO0002 Reserved for concepts with insufficient information to code with codable children: Secondary | ICD-10-CM

## 2015-03-17 NOTE — Telephone Encounter (Signed)
Referral placed and pt notified

## 2015-03-17 NOTE — Telephone Encounter (Signed)
[place] for referral to [facility]?

## 2015-03-17 NOTE — Telephone Encounter (Signed)
Ok to refer to Dr Cruzita Lederer

## 2015-03-17 NOTE — Telephone Encounter (Signed)
Caller name:Jannelle Kempton Relationship to patient:self Can be reached:[phone] Pharmacy:  Reason for call:Requesting a recommendation to another Endocrinologist, prefers female. Pt is currently seeing [facility], Dr Posey Pronto.

## 2015-03-23 LAB — HEMOGLOBIN A1C: Hgb A1c MFr Bld: 8.1 % — AB (ref 4.0–6.0)

## 2015-03-25 ENCOUNTER — Encounter: Payer: Self-pay | Admitting: General Practice

## 2015-04-08 ENCOUNTER — Encounter: Payer: Self-pay | Admitting: Endocrinology

## 2015-07-24 ENCOUNTER — Ambulatory Visit: Payer: 59 | Admitting: Family Medicine

## 2015-08-06 ENCOUNTER — Ambulatory Visit (HOSPITAL_BASED_OUTPATIENT_CLINIC_OR_DEPARTMENT_OTHER)
Admission: RE | Admit: 2015-08-06 | Discharge: 2015-08-06 | Disposition: A | Discharge status: Home / Self Care | Payer: 59 | Source: Ambulatory Visit | Attending: Family Medicine | Admitting: Family Medicine

## 2015-08-06 ENCOUNTER — Other Ambulatory Visit: Payer: Self-pay | Admitting: Family Medicine

## 2015-08-06 DIAGNOSIS — R928 Other abnormal and inconclusive findings on diagnostic imaging of breast: Secondary | ICD-10-CM | POA: Diagnosis not present

## 2015-08-06 DIAGNOSIS — Z1231 Encounter for screening mammogram for malignant neoplasm of breast: Secondary | ICD-10-CM

## 2015-08-10 ENCOUNTER — Other Ambulatory Visit: Payer: Self-pay | Admitting: Family Medicine

## 2015-08-10 DIAGNOSIS — R928 Other abnormal and inconclusive findings on diagnostic imaging of breast: Secondary | ICD-10-CM

## 2015-08-18 ENCOUNTER — Ambulatory Visit
Admission: RE | Admit: 2015-08-18 | Discharge: 2015-08-18 | Disposition: A | Discharge status: Home / Self Care | Payer: 59 | Source: Ambulatory Visit | Attending: Family Medicine | Admitting: Family Medicine

## 2015-08-18 DIAGNOSIS — R928 Other abnormal and inconclusive findings on diagnostic imaging of breast: Secondary | ICD-10-CM

## 2015-09-07 ENCOUNTER — Ambulatory Visit: Payer: 59 | Admitting: Family Medicine

## 2015-09-07 ENCOUNTER — Telehealth: Payer: Self-pay | Admitting: Family Medicine

## 2015-09-10 NOTE — Telephone Encounter (Signed)
Yes- pt needs to be charged 

## 2015-09-10 NOTE — Telephone Encounter (Signed)
Pt was no show 09/07/15 2:00pm for acute appt, charge or no charge?

## 2015-09-21 LAB — HEMOGLOBIN A1C: Hgb A1c MFr Bld: 9.1 % — AB (ref 4.0–6.0)

## 2015-09-30 ENCOUNTER — Encounter: Payer: Self-pay | Admitting: General Practice

## 2015-11-10 ENCOUNTER — Telehealth: Payer: Self-pay | Admitting: Family

## 2015-11-10 DIAGNOSIS — R05 Cough: Secondary | ICD-10-CM

## 2015-11-10 DIAGNOSIS — R059 Cough, unspecified: Secondary | ICD-10-CM

## 2015-11-10 MED ORDER — GUAIFENESIN-DM 100-10 MG/5ML PO SYRP: 5.0000 mL | ORAL_SOLUTION | ORAL | Status: DC | PRN

## 2015-11-10 MED ORDER — BENZONATATE 100 MG PO CAPS: 100.0000 mg | ORAL_CAPSULE | Freq: Three times a day (TID) | ORAL | Status: DC | PRN

## 2015-11-10 NOTE — Addendum Note (Signed)
Addended by: Guadelupe Sabin C on: 11/10/2015 01:06 PM   Modules accepted: Orders

## 2015-11-10 NOTE — Progress Notes (Signed)
I reviewed your past charts and the cough medications you are talking about from Dr. Birdie Riddle are ones that the [facility] does not allow to be prescribed by phone or electronically. They have narcotics. That being said, I am going to send an e-prescription for the non-narcotic version of the same one she gave you in the past. Also, I will add Tessalon Perles, 100-200mg  (1-2 caps) every 8 hours only as needed for cough. However, it is very important that you get the over-the-counter Prilosec 20mg  twice daily to see if the cough clears up that way. If it is reflux, that must be treated soon to avoid serious complications including cancer (if untreated long-term).

## 2015-11-10 NOTE — Progress Notes (Signed)
We are sorry that you are not feeling well.  Here is how we plan to help!  Based on what you have shared with me it looks like you have upper respiratory tract inflammation that has resulted in a significant cough.  Inflammation and infection in the upper respiratory tract is commonly called bronchitis and has four common causes:  Allergies, Viral Infections, Acid Reflux and Bacterial Infections.  Allergies, viruses and acid reflux are treated by controlling symptoms or eliminating the cause. An example might be a cough caused by taking certain blood pressure medications. You stop the cough by changing the medication. Another example might be a cough caused by acid reflux. Controlling the reflux helps control the cough.  Based on your presentation I believe you most likely have A cough due to reflux. To lessen this cough you can use the over-the counter cough medication Delsym but it is very important that we control the cause of the cough.  I suggest that you begin Prilosec 20 mg twice a day for 2 weeks.  You should see the cough lessen quickly as your reflux ( which may be silent or without burning ) is treated. This is over-the-counter.    In addition you may use A non-prescription cough medication called Robitussin DAC. Take 2 teaspoons every 8 hours or Delsym: take 2 teaspoons every 12 hours.    HOME CARE . Only take medications as instructed by your medical team. . Complete the entire course of an antibiotic. . Drink plenty of fluids and get plenty of rest. . Avoid close contacts especially the very young and the elderly . Cover your mouth if you cough or cough into your sleeve. . Always remember to wash your hands . A steam or ultrasonic humidifier can help congestion.    GET HELP RIGHT AWAY IF: . You develop worsening fever. . You become short of breath . You cough up blood. . Your symptoms persist after you have completed your treatment plan MAKE SURE YOU   Understand these  instructions.  Will watch your condition.  Will get help right away if you are not doing well or get worse.  Your e-visit answers were reviewed by a board certified advanced clinical practitioner to complete your personal care plan.  Depending on the condition, your plan could have included both over the counter or prescription medications. If there is a problem please reply  once you have received a response from your provider. Your safety is important to [place].  If you have drug allergies check your prescription carefully.    You can use MyChart to ask questions about today's visit, request a non-urgent call back, or ask for a work or school excuse for 24 hours related to this e-Visit. If it has been greater than 24 hours you will need to follow up with your provider, or enter a new e-Visit to address those concerns. You will get an e-mail in the next two days asking about your experience.  I hope that your e-visit has been valuable and will speed your recovery. Thank you for using e-visits.

## 2016-01-19 ENCOUNTER — Telehealth: Payer: Self-pay | Admitting: Behavioral Health

## 2016-01-19 ENCOUNTER — Encounter: Payer: Self-pay | Admitting: Behavioral Health

## 2016-01-19 NOTE — Telephone Encounter (Signed)
Pre-Visit Call completed with patient and chart updated.   Pre-Visit Info documented in Specialty Comments under [employer].    

## 2016-01-20 ENCOUNTER — Encounter: Payer: 59 | Admitting: Family Medicine

## 2016-02-08 ENCOUNTER — Telehealth: Payer: Self-pay | Admitting: Family Medicine

## 2016-02-08 NOTE — Telephone Encounter (Signed)
Relation to pt: self Call back number:[phone]   Reason for call:  Patient 01/20/2016 physical had to be [facility] due to provider patient would like to know if she can be squeezed in before the end of April due to insurance/ work Program[employer], systems. Advised patient provider is out of the office this week. Please advise

## 2016-02-08 NOTE — Telephone Encounter (Signed)
Scheduled for 4/21 11:00am

## 2016-02-08 NOTE — Telephone Encounter (Signed)
The only thing we can do is use the 11 am on 11/28. Or she can be placed on cancellation list

## 2016-02-08 NOTE — Telephone Encounter (Signed)
lvm advising patient awaiting call back

## 2016-02-19 ENCOUNTER — Ambulatory Visit (INDEPENDENT_AMBULATORY_CARE_PROVIDER_SITE_OTHER): Payer: 59 | Admitting: Family Medicine

## 2016-02-19 ENCOUNTER — Encounter: Payer: Self-pay | Admitting: Family Medicine

## 2016-02-19 VITALS — BP 120/82 | HR 69 | Temp 98.0°F | Resp 16 | Ht 68.0 in | Wt 196.0 lb

## 2016-02-19 DIAGNOSIS — E1165 Type 2 diabetes mellitus with hyperglycemia: Secondary | ICD-10-CM

## 2016-02-19 DIAGNOSIS — IMO0001 Reserved for inherently not codable concepts without codable children: Secondary | ICD-10-CM

## 2016-02-19 DIAGNOSIS — Z0000 Encounter for general adult medical examination without abnormal findings: Secondary | ICD-10-CM

## 2016-02-19 LAB — LIPID PANEL
CHOLESTEROL: 207 mg/dL — AB (ref 0–200)
HDL: 44.9 mg/dL (ref >39.00)
LDL CALC: 134 mg/dL — AB (ref 0–99)
NonHDL: 161.64
TRIGLYCERIDES: 140 mg/dL (ref 0.0–149.0)
Total CHOL/HDL Ratio: 5
VLDL: 28 mg/dL (ref 0.0–40.0)

## 2016-02-19 LAB — CBC WITH DIFFERENTIAL/PLATELET
BASOS ABS: 0 10*3/uL (ref 0.0–0.1)
Basophils Relative: 0.4 % (ref 0.0–3.0)
Eosinophils Absolute: 0.2 10*3/uL (ref 0.0–0.7)
Eosinophils Relative: 3.8 % (ref 0.0–5.0)
HEMATOCRIT: 36.4 % (ref 36.0–46.0)
Hemoglobin: 12.1 g/dL (ref 12.0–15.0)
LYMPHS ABS: 1.9 10*3/uL (ref 0.7–4.0)
LYMPHS PCT: 36.4 % (ref 12.0–46.0)
MCHC: 33.1 g/dL (ref 30.0–36.0)
MCV: 88.1 fl (ref 78.0–100.0)
Monocytes Absolute: 0.4 10*3/uL (ref 0.1–1.0)
Monocytes Relative: 7.8 % (ref 3.0–12.0)
NEUTROS ABS: 2.7 10*3/uL (ref 1.4–7.7)
NEUTROS PCT: 51.6 % (ref 43.0–77.0)
PLATELETS: 275 10*3/uL (ref 150.0–400.0)
RBC: 4.13 Mil/uL (ref 3.87–5.11)
RDW: 14.1 % (ref 11.5–15.5)
WBC: 5.3 10*3/uL (ref 4.0–10.5)

## 2016-02-19 LAB — HEPATIC FUNCTION PANEL
ALBUMIN: 4.1 g/dL (ref 3.5–5.2)
ALT: 12 U/L (ref 0–35)
AST: 12 U/L (ref 0–37)
Alkaline Phosphatase: 58 U/L (ref 39–117)
BILIRUBIN DIRECT: 0.1 mg/dL (ref 0.0–0.3)
TOTAL PROTEIN: 7.1 g/dL (ref 6.0–8.3)
Total Bilirubin: 0.3 mg/dL (ref 0.2–1.2)

## 2016-02-19 LAB — BASIC METABOLIC PANEL
BUN: 29 mg/dL — AB (ref 6–23)
CALCIUM: 10.2 mg/dL (ref 8.4–10.5)
CO2: 31 meq/L (ref 19–32)
Chloride: 103 mEq/L (ref 96–112)
Creatinine, Ser: 1.17 mg/dL (ref 0.40–1.20)
GFR: 61.41 mL/min (ref >60.00)
GLUCOSE: 97 mg/dL (ref 70–99)
Potassium: 4.3 mEq/L (ref 3.5–5.1)
SODIUM: 140 meq/L (ref 135–145)

## 2016-02-19 LAB — TSH: TSH: 1.42 u[IU]/mL (ref 0.35–4.50)

## 2016-02-19 LAB — VITAMIN D 25 HYDROXY (VIT D DEFICIENCY, FRACTURES): VITD: 27.79 ng/mL — ABNORMAL LOW (ref 30.00–100.00)

## 2016-02-19 MED ORDER — FLUCONAZOLE 150 MG PO TABS: 150.0000 mg | ORAL_TABLET | Freq: Once | ORAL | Status: DC

## 2016-02-19 NOTE — Progress Notes (Signed)
Pre visit review using our clinic review tool, if applicable. No additional management support is needed unless otherwise documented below in the visit note. 

## 2016-02-19 NOTE — Progress Notes (Signed)
   Subjective:    Patient ID: Kathleen Acevedo, female    DOB: 12-Nov-1960, 55 y.o.   MRN: [id number]  HPI CPE- UTD on mammo, colonoscopy (due 2024), no need for paps due to hysterectomy.  Pt has lost 30 lbs since last visit!!!  Doing Weight Watchers.  DM- Patel, due for eye exam.   Review of Systems Patient reports no vision/ hearing changes, adenopathy,fever, weight change,  persistant/recurrent hoarseness , swallowing issues, chest pain, palpitations, edema, persistant/recurrent cough, hemoptysis, dyspnea (rest/exertional/paroxysmal nocturnal), gastrointestinal bleeding (melena, rectal bleeding), abdominal pain, significant heartburn, bowel changes, GU symptoms (dysuria, hematuria, incontinence),  syncope, focal weakness, memory loss, numbness & tingling, skin/hair/nail changes, abnormal bruising or bleeding, anxiety, or depression.   + yeast- on Farxiga    Objective:   Physical Exam General Appearance:    Alert, cooperative, no distress, appears stated age  Head:    Normocephalic, without obvious abnormality, atraumatic  Eyes:    PERRL, conjunctiva/corneas clear, EOM's intact, fundi    benign, both eyes  Ears:    Normal TM's and external ear canals, both ears  Nose:   Nares normal, septum midline, mucosa normal, no drainage    or sinus tenderness  Throat:   Lips, mucosa, and tongue normal; teeth and gums normal  Neck:   Supple, symmetrical, trachea midline, no adenopathy;    Thyroid: no enlargement/tenderness/nodules  Back:     Symmetric, no curvature, ROM normal, no CVA tenderness  Lungs:     Clear to auscultation bilaterally, respirations unlabored  Chest Wall:    No tenderness or deformity   Heart:    Regular rate and rhythm, S1 and S2 normal, no murmur, rub   or gallop  Breast Exam:    Deferred to mammo  Abdomen:     Soft, non-tender, bowel sounds active all four quadrants,    no masses, no organomegaly  Genitalia:    Deferred  Rectal:    Extremities:   Extremities normal,  atraumatic, no cyanosis or edema  Pulses:   2+ and symmetric all extremities  Skin:   Skin color, texture, turgor normal, no rashes or lesions  Lymph nodes:   Cervical, supraclavicular, and axillary nodes normal  Neurologic:   CNII-XII intact, normal strength, sensation and reflexes    throughout          Assessment & Plan:

## 2016-02-19 NOTE — Assessment & Plan Note (Signed)
Pt's PE WNL.  UTD on mammo, colonoscopy.  No need for pap.  Check labs.  Anticipatory guidance provided.

## 2016-02-19 NOTE — Assessment & Plan Note (Signed)
Following w/ Dr Posey Pronto.  Due for eye exam.  Diflucan called in for yeast infection caused by [place].

## 2016-02-19 NOTE — Patient Instructions (Signed)
Follow up in 6 months to recheck BP We'll notify you of your lab results and make any changes if needed Continue to work on healthy diet and regular exercise- you look great! Schedule your eye exam! Call with any questions or concerns Thanks for sticking with [place]! Happy Spring!!!

## 2016-02-22 ENCOUNTER — Other Ambulatory Visit: Payer: Self-pay | Admitting: General Practice

## 2016-02-22 DIAGNOSIS — E785 Hyperlipidemia, unspecified: Secondary | ICD-10-CM

## 2016-02-22 MED ORDER — ATORVASTATIN CALCIUM 20 MG PO TABS: 20.0000 mg | ORAL_TABLET | Freq: Every day | ORAL | Status: DC

## 2016-03-09 ENCOUNTER — Encounter: Payer: 59 | Admitting: Family Medicine

## 2016-03-24 ENCOUNTER — Telehealth: Payer: Self-pay | Admitting: Family Medicine

## 2016-03-24 NOTE — Telephone Encounter (Signed)
Pt states that she is disputing the $50.00 no show fee from visit on 09/07/15 with Dr Birdie Riddle. Pt states that she did not make this appt and would like someone to call her back regarding this.

## 2016-03-31 NOTE — Telephone Encounter (Signed)
LMOM that NS fee will be reversed.

## 2016-04-11 ENCOUNTER — Telehealth: Payer: Self-pay | Admitting: Family Medicine

## 2016-04-11 NOTE — Telephone Encounter (Signed)
Pt asking for something to be called in for an yeast infection, [facility] [address] in [place]

## 2016-04-11 NOTE — Telephone Encounter (Signed)
Pt would need an appt for evaluation.

## 2016-04-11 NOTE — Telephone Encounter (Signed)
Called pt, she states that she will call back to schedule an appt.

## 2016-05-04 LAB — HEMOGLOBIN A1C: HEMOGLOBIN A1C: 6.2

## 2016-05-04 LAB — HM DIABETES FOOT EXAM: HM DIABETIC FOOT EXAM: NORMAL

## 2016-07-15 ENCOUNTER — Other Ambulatory Visit (HOSPITAL_COMMUNITY)
Admission: RE | Admit: 2016-07-15 | Discharge: 2016-07-15 | Disposition: A | Discharge status: Home / Self Care | Payer: 59 | Source: Ambulatory Visit | Attending: Family Medicine | Admitting: Family Medicine

## 2016-07-15 ENCOUNTER — Encounter: Payer: Self-pay | Admitting: Family Medicine

## 2016-07-15 ENCOUNTER — Ambulatory Visit (INDEPENDENT_AMBULATORY_CARE_PROVIDER_SITE_OTHER): Payer: 59 | Admitting: Family Medicine

## 2016-07-15 VITALS — BP 120/80 | HR 70 | Temp 98.7°F | Resp 16 | Ht 68.0 in | Wt 179.4 lb

## 2016-07-15 DIAGNOSIS — N760 Acute vaginitis: Secondary | ICD-10-CM | POA: Diagnosis not present

## 2016-07-15 DIAGNOSIS — Z23 Encounter for immunization: Secondary | ICD-10-CM

## 2016-07-15 NOTE — Progress Notes (Signed)
   Subjective:    Patient ID: Kathleen Acevedo, female    DOB: 07-28-1961, 55 y.o.   MRN: [id number]  HPI Vaginitis- pt was taking [place] and had recurrent yeast infxns.  Pt again developed itching, burning, some discharge.  Stopped Farxiga 3 days ago.  Pt has a few diflucan remaining from Dr Posey Pronto.  Took pill Wednesday, due to repeat today.   Review of Systems For ROS see HPI     Objective:   Physical Exam  Constitutional: She is oriented to person, place, and time. She appears well-developed and well-nourished. No distress.  HENT:  Head: Normocephalic and atraumatic.  Genitourinary: Vagina normal. No vaginal discharge found.  Genitourinary Comments: No external irritation or inflammation Wet prep collected  Neurological: She is alert and oriented to person, place, and time.  Skin: Skin is warm and dry.  Psychiatric: She has a normal mood and affect. Her behavior is normal. Thought content normal.  Vitals reviewed.         Assessment & Plan:  Vaginitis- new.  Pt has hx of recurrent yeast infxns on Farxiga.  Just stopped this medication 3 days ago.  Took 1 Diflucan 2 days ago and plans to repeat today.  This should clear any yeast but wet prep collected to r/o possible BV.  Reviewed supportive care and red flags that should prompt return.  Pt expressed understanding and is in agreement w/ plan.

## 2016-07-15 NOTE — Patient Instructions (Addendum)
Follow up as needed/scheduled We'll notify you of your lab results and make any changes if needed Take the 2nd Diflucan today and hopefully that will do it! Keep up the good work!  You look amazing!!! Call with any questions or concerns Happy Early Birthday!!!

## 2016-07-15 NOTE — Progress Notes (Signed)
Pre visit review using our clinic review tool, if applicable. No additional management support is needed unless otherwise documented below in the visit note. 

## 2016-07-18 ENCOUNTER — Ambulatory Visit: Payer: 59 | Admitting: Family Medicine

## 2016-07-19 ENCOUNTER — Telehealth: Payer: Self-pay | Admitting: Family Medicine

## 2016-07-19 LAB — CERVICOVAGINAL ANCILLARY ONLY: WET PREP (BD AFFIRM): POSITIVE — AB

## 2016-07-19 NOTE — Telephone Encounter (Signed)
Patient calling to request results of lab work done on 07/15/16.

## 2016-07-20 ENCOUNTER — Other Ambulatory Visit: Payer: Self-pay | Admitting: General Practice

## 2016-07-20 MED ORDER — METRONIDAZOLE 500 MG PO TABS: 500.0000 mg | ORAL_TABLET | Freq: Two times a day (BID) | ORAL | 0 refills | Status: AC

## 2016-07-20 NOTE — Telephone Encounter (Signed)
Pt informed of results.

## 2016-07-24 LAB — HM DIABETES EYE EXAM

## 2016-08-01 ENCOUNTER — Other Ambulatory Visit: Payer: Self-pay | Admitting: Family Medicine

## 2016-08-01 DIAGNOSIS — Z1231 Encounter for screening mammogram for malignant neoplasm of breast: Secondary | ICD-10-CM

## 2016-08-10 ENCOUNTER — Ambulatory Visit
Admission: RE | Admit: 2016-08-10 | Discharge: 2016-08-10 | Disposition: A | Discharge status: Home / Self Care | Payer: 59 | Source: Ambulatory Visit | Attending: Family Medicine | Admitting: Family Medicine

## 2016-08-10 DIAGNOSIS — Z1231 Encounter for screening mammogram for malignant neoplasm of breast: Secondary | ICD-10-CM

## 2016-08-15 ENCOUNTER — Ambulatory Visit: Payer: 59 | Admitting: Family Medicine

## 2016-11-02 ENCOUNTER — Ambulatory Visit (INDEPENDENT_AMBULATORY_CARE_PROVIDER_SITE_OTHER): Payer: 59 | Admitting: Family Medicine

## 2016-11-02 ENCOUNTER — Encounter: Payer: Self-pay | Admitting: Family Medicine

## 2016-11-02 VITALS — BP 121/69 | HR 72 | Temp 98.6°F | Resp 16 | Ht 68.0 in | Wt 189.1 lb

## 2016-11-02 DIAGNOSIS — J0110 Acute frontal sinusitis, unspecified: Secondary | ICD-10-CM | POA: Diagnosis not present

## 2016-11-02 MED ORDER — AMOXICILLIN 875 MG PO TABS: 875.0000 mg | ORAL_TABLET | Freq: Two times a day (BID) | ORAL | 0 refills | Status: DC

## 2016-11-02 MED ORDER — GUAIFENESIN-CODEINE 100-10 MG/5ML PO SYRP: 10.0000 mL | ORAL_SOLUTION | Freq: Three times a day (TID) | ORAL | 0 refills | Status: DC | PRN

## 2016-11-02 NOTE — Progress Notes (Signed)
   Subjective:    Patient ID: Kathleen Acevedo, female    DOB: 11/03/1960, 56 y.o.   MRN: [id number]  HPI URI- sxs started 12/29.  'i can't take it any more'.  Sore throat and cough- dry and hacking, to the point of almost vomiting.  + sinus pain.  + HA.  No ear pain.  + tooth pain.  No fevers but alternating chills and sweats last night.  + sick contacts.   Review of Systems For ROS see HPI     Objective:   Physical Exam  Constitutional: She appears well-developed and well-nourished. No distress.  HENT:  Head: Normocephalic and atraumatic.  Right Ear: Tympanic membrane normal.  Left Ear: Tympanic membrane normal.  Nose: Mucosal edema and rhinorrhea present. Right sinus exhibits frontal sinus tenderness. Right sinus exhibits no maxillary sinus tenderness. Left sinus exhibits frontal sinus tenderness. Left sinus exhibits no maxillary sinus tenderness.  Mouth/Throat: Uvula is midline and mucous membranes are normal. Posterior oropharyngeal erythema present. No oropharyngeal exudate.  Eyes: Conjunctivae and EOM are normal. Pupils are equal, round, and reactive to light.  Neck: Normal range of motion. Neck supple.  Cardiovascular: Normal rate, regular rhythm and normal heart sounds.   Pulmonary/Chest: Effort normal and breath sounds normal. No respiratory distress. She has no wheezes.  Lymphadenopathy:    She has no cervical adenopathy.  Vitals reviewed.         Assessment & Plan:  Sinusitis- pt's sxs and PE consistent w/ infxn.  Start abx.  Cough meds prn.  Reviewed supportive care and red flags that should prompt return.  Pt expressed understanding and is in agreement w/ plan.

## 2016-11-02 NOTE — Patient Instructions (Signed)
Follow up as needed/scheduled Start the Amoxicillin- twice daily- take w/ food for the sinus infection Drink plenty of fluids Use the cough syrup as needed- will cause drowsiness If you need cough control w/o drowsiness, mucinex DM REST! Call with any questions or concerns Hang in there! Happy New Year!!!

## 2016-11-02 NOTE — Progress Notes (Signed)
Pre visit review using our clinic review tool, if applicable. No additional management support is needed unless otherwise documented below in the visit note. 

## 2016-12-01 DIAGNOSIS — Z794 Long term (current) use of insulin: Secondary | ICD-10-CM | POA: Diagnosis not present

## 2016-12-01 DIAGNOSIS — E1165 Type 2 diabetes mellitus with hyperglycemia: Secondary | ICD-10-CM | POA: Diagnosis not present

## 2016-12-01 DIAGNOSIS — I10 Essential (primary) hypertension: Secondary | ICD-10-CM | POA: Diagnosis not present

## 2017-01-31 DIAGNOSIS — L97522 Non-pressure chronic ulcer of other part of left foot with fat layer exposed: Secondary | ICD-10-CM | POA: Diagnosis not present

## 2017-01-31 DIAGNOSIS — M2042 Other hammer toe(s) (acquired), left foot: Secondary | ICD-10-CM | POA: Diagnosis not present

## 2017-01-31 DIAGNOSIS — L84 Corns and callosities: Secondary | ICD-10-CM | POA: Diagnosis not present

## 2017-04-27 DIAGNOSIS — L84 Corns and callosities: Secondary | ICD-10-CM | POA: Diagnosis not present

## 2017-04-27 DIAGNOSIS — M2042 Other hammer toe(s) (acquired), left foot: Secondary | ICD-10-CM | POA: Diagnosis not present

## 2017-06-20 DIAGNOSIS — L97522 Non-pressure chronic ulcer of other part of left foot with fat layer exposed: Secondary | ICD-10-CM | POA: Diagnosis not present

## 2017-06-20 DIAGNOSIS — M2042 Other hammer toe(s) (acquired), left foot: Secondary | ICD-10-CM | POA: Diagnosis not present

## 2017-06-20 DIAGNOSIS — L84 Corns and callosities: Secondary | ICD-10-CM | POA: Diagnosis not present

## 2017-06-22 ENCOUNTER — Encounter: Payer: Self-pay | Admitting: *Deleted

## 2017-06-29 ENCOUNTER — Ambulatory Visit (INDEPENDENT_AMBULATORY_CARE_PROVIDER_SITE_OTHER): Payer: 59 | Admitting: Family Medicine

## 2017-06-29 ENCOUNTER — Encounter: Payer: Self-pay | Admitting: Family Medicine

## 2017-06-29 ENCOUNTER — Telehealth: Payer: Self-pay | Admitting: Family Medicine

## 2017-06-29 VITALS — BP 112/72 | HR 65 | Ht 68.0 in | Wt 205.0 lb

## 2017-06-29 DIAGNOSIS — E785 Hyperlipidemia, unspecified: Secondary | ICD-10-CM

## 2017-06-29 DIAGNOSIS — Z0000 Encounter for general adult medical examination without abnormal findings: Secondary | ICD-10-CM

## 2017-06-29 DIAGNOSIS — I10 Essential (primary) hypertension: Secondary | ICD-10-CM

## 2017-06-29 DIAGNOSIS — E1165 Type 2 diabetes mellitus with hyperglycemia: Secondary | ICD-10-CM | POA: Diagnosis not present

## 2017-06-29 DIAGNOSIS — IMO0001 Reserved for inherently not codable concepts without codable children: Secondary | ICD-10-CM

## 2017-06-29 NOTE — Patient Instructions (Signed)
Preventive Care 40-64 Years, Female Preventive care refers to lifestyle choices and visits with your health care provider that can promote health and wellness. What does preventive care include?  A yearly physical exam. This is also called an annual well check.  Dental exams once or twice a year.  Routine eye exams. Ask your health care provider how often you should have your eyes checked.  Personal lifestyle choices, including: ? Daily care of your teeth and gums. ? Regular physical activity. ? Eating a healthy diet. ? Avoiding tobacco and drug use. ? Limiting alcohol use. ? Practicing safe sex. ? Taking low-dose aspirin daily starting at age 58. ? Taking vitamin and mineral supplements as recommended by your health care provider. What happens during an annual well check? The services and screenings done by your health care provider during your annual well check will depend on your age, overall health, lifestyle risk factors, and family history of disease. Counseling Your health care provider may ask you questions about your:  Alcohol use.  Tobacco use.  Drug use.  Emotional well-being.  Home and relationship well-being.  Sexual activity.  Eating habits.  Work and work Statistician.  Method of birth control.  Menstrual cycle.  Pregnancy history.  Screening You may have the following tests or measurements:  Height, weight, and BMI.  Blood pressure.  Lipid and cholesterol levels. These may be checked every 5 years, or more frequently if you are over 81 years old.  Skin check.  Lung cancer screening. You may have this screening every year starting at age 78 if you have a 30-pack-year history of smoking and currently smoke or have quit within the past 15 years.  Fecal occult blood test (FOBT) of the stool. You may have this test every year starting at age 65.  Flexible sigmoidoscopy or colonoscopy. You may have a sigmoidoscopy every 5 years or a colonoscopy  every 10 years starting at age 30.  Hepatitis C blood test.  Hepatitis B blood test.  Sexually transmitted disease (STD) testing.  Diabetes screening. This is done by checking your blood sugar (glucose) after you have not eaten for a while (fasting). You may have this done every 1-3 years.  Mammogram. This may be done every 1-2 years. Talk to your health care provider about when you should start having regular mammograms. This may depend on whether you have a family history of breast cancer.  BRCA-related cancer screening. This may be done if you have a family history of breast, ovarian, tubal, or peritoneal cancers.  Pelvic exam and Pap test. This may be done every 3 years starting at age 80. Starting at age 36, this may be done every 5 years if you have a Pap test in combination with an HPV test.  Bone density scan. This is done to screen for osteoporosis. You may have this scan if you are at high risk for osteoporosis.  Discuss your test results, treatment options, and if necessary, the need for more tests with your health care provider. Vaccines Your health care provider may recommend certain vaccines, such as:  Influenza vaccine. This is recommended every year.  Tetanus, diphtheria, and acellular pertussis (Tdap, Td) vaccine. You may need a Td booster every 10 years.  Varicella vaccine. You may need this if you have not been vaccinated.  Zoster vaccine. You may need this after age 5.  Measles, mumps, and rubella (MMR) vaccine. You may need at least one dose of MMR if you were born in  1959 or later. You may also need a second dose.  Pneumococcal 13-valent conjugate (PCV13) vaccine. You may need this if you have certain conditions and were not previously vaccinated.  Pneumococcal polysaccharide (PPSV23) vaccine. You may need one or two doses if you smoke cigarettes or if you have certain conditions.  Meningococcal vaccine. You may need this if you have certain  conditions.  Hepatitis A vaccine. You may need this if you have certain conditions or if you travel or work in places where you may be exposed to hepatitis A.  Hepatitis B vaccine. You may need this if you have certain conditions or if you travel or work in places where you may be exposed to hepatitis B.  Haemophilus influenzae type b (Hib) vaccine. You may need this if you have certain conditions.  Talk to your health care provider about which screenings and vaccines you need and how often you need them. This information is not intended to replace advice given to you by your health care provider. Make sure you discuss any questions you have with your health care provider. Document Released: 11/13/2015 Document Revised: 07/06/2016 Document Reviewed: 08/18/2015 Elsevier Interactive Patient Education  2017 [employer].

## 2017-06-29 NOTE — Telephone Encounter (Signed)
Left message on machine to call back. Pt had appt today but had to leave due to having a meeting at work. Called to see when pt could come in for her blood work and pneumonia shot that physician wanted her to have

## 2017-06-29 NOTE — Progress Notes (Signed)
Subjective:     Kathleen Acevedo is a 56 y.o. female and is here for a comprehensive physical exam. The patient reports no problems.  Pt sees endo for dm and cholesterol.   Social History   Social History  . Marital status: Married    Spouse name: N/A  . Number of children: N/A  . Years of education: N/A   Occupational History  . Therapist, music    Social History Main Topics  . Smoking status: Never Smoker  . Smokeless tobacco: Never Used  . Alcohol use No  . Drug use: No  . Sexual activity: Yes    Partners: Male   Other Topics Concern  . Not on file   Social History Narrative   Exercise --  4-5 days a week-- walking    Health Maintenance  Topic Date Due  . INFLUENZA VACCINE  05/31/2017  . HEMOGLOBIN A1C  05/31/2017  . PNEUMOCOCCAL POLYSACCHARIDE VACCINE (2) 07/31/2017 (Originally 11/26/2013)  . HIV Screening  07/31/2017 (Originally 07/24/1974)  . MAMMOGRAM  08/10/2017  . FOOT EXAM  03/15/2018  . OPHTHALMOLOGY EXAM  04/28/2018  . TETANUS/TDAP  12/01/2018  . COLONOSCOPY  04/20/2023  . Hepatitis C Screening  Completed    The following portions of the patient's history were reviewed and updated as appropriate:  She  has a past medical history of Diabetes mellitus; Hyperlipidemia; and Hypertension. She  does not have any pertinent problems on file. She  has a past surgical history that includes Abdominal hysterectomy and Oophorectomy. Her family history includes Diabetes in her father. She  reports that she has never smoked. She has never used smokeless tobacco. She reports that she does not drink alcohol or use drugs. She has a current medication list which includes the following prescription(s): iron, fluticasone, glucose blood, insulin glargine, lisinopril-hydrochlorothiazide, metformin, multiple vitamins-minerals, onetouch delica lancets, atorvastatin, farxiga, and guaifenesin-codeine. Current Outpatient Prescriptions on File Prior to Visit  Medication Sig Dispense  Refill  . Ferrous Sulfate (IRON) 325 (65 FE) MG TABS Take by mouth daily.      . fluticasone (FLONASE) 50 MCG/ACT nasal spray Place 2 sprays into both nostrils daily. 16 g 6  . glucose blood (ONE TOUCH ULTRA TEST) test strip 1 each by Other route 2 (two) times daily. Use as instructed     . Insulin Glargine (TOUJEO SOLOSTAR) 300 UNIT/ML SOPN Place 50 Units in ear(s) daily.     [name] lisinopril-hydrochlorothiazide (PRINZIDE,ZESTORETIC) 20-12.5 MG per tablet Take 1 tablet by mouth daily.    . metFORMIN (GLUCOPHAGE) 1000 MG tablet Take 1 tablet (1,000 mg total) by mouth 2 (two) times daily. 60 tablet 0  . Multiple Vitamin (MULTIVITAMIN PO) Take by mouth daily.      Glory Rosebush DELICA LANCETS MISC by Does not apply route 2 (two) times daily.      [name] atorvastatin (LIPITOR) 20 MG tablet Take 1 tablet (20 mg total) by mouth daily. (Patient not taking: Reported on 06/29/2017) 30 tablet 6  . FARXIGA 5 MG TABS tablet Take 5 mg by mouth daily.     [name] guaiFENesin-codeine (ROBITUSSIN AC) 100-10 MG/5ML syrup Take 10 mLs by mouth 3 (three) times daily as needed for cough. (Patient not taking: Reported on 06/29/2017) 240 mL 0   No current facility-administered medications on file prior to visit.    She has No Known Allergies..  Review of Systems Review of Systems  Constitutional: Negative for activity change, appetite change and fatigue.  HENT: Negative for hearing loss,  congestion, tinnitus and ear discharge.  dentist q74m Eyes: Negative for visual disturbance (see optho q1y -- vision corrected to 20/20 with glasses).  Respiratory: Negative for cough, chest tightness and shortness of breath.   Cardiovascular: Negative for chest pain, palpitations and leg swelling.  Gastrointestinal: Negative for abdominal pain, diarrhea, constipation and abdominal distention.  Genitourinary: Negative for urgency, frequency, decreased urine volume and difficulty urinating.  Musculoskeletal: Negative for back pain, arthralgias and  gait problem.  Skin: Negative for color change, pallor and rash.  Neurological: Negative for dizziness, light-headedness, numbness and headaches.  Hematological: Negative for adenopathy. Does not bruise/bleed easily.  Psychiatric/Behavioral: Negative for suicidal ideas, confusion, sleep disturbance, self-injury, dysphoric mood, decreased concentration and agitation.       Objective:    BP 112/72 (BP Location: Right Arm, Patient Position: Sitting, Cuff Size: Normal)   Pulse 65   Ht 5\' 8"  (1.727 m)   Wt 205 lb (93 kg)   SpO2 98%   BMI 31.17 kg/m  General appearance: alert, cooperative, appears stated age and no distress Head: Normocephalic, without obvious abnormality, atraumatic Eyes: conjunctivae/corneas clear. PERRL, EOM's intact. Fundi benign. Ears: normal TM's and external ear canals both ears Nose: Nares normal. Septum midline. Mucosa normal. No drainage or sinus tenderness. Throat: lips, mucosa, and tongue normal; teeth and gums normal Neck: no adenopathy, no carotid bruit, no JVD, supple, symmetrical, trachea midline and thyroid not enlarged, symmetric, no tenderness/mass/nodules Back: symmetric, no curvature. ROM normal. No CVA tenderness. Lungs: clear to auscultation bilaterally Breasts: normal appearance, no masses or tenderness Heart: regular rate and rhythm, S1, S2 normal, no murmur, click, rub or gallop Abdomen: soft, non-tender; bowel sounds normal; no masses,  no organomegaly Pelvic: not indicated; status post hysterectomy, negative ROS Extremities: extremities normal, atraumatic, no cyanosis or edema Pulses: 2+ and symmetric Skin: Skin color, texture, turgor normal. No rashes or lesions Lymph nodes: Cervical, supraclavicular, and axillary nodes normal. Neurologic: Alert and oriented X 3, normal strength and tone. Normal symmetric reflexes. Normal coordination and gait    Assessment:    Healthy female exam.      Plan:    ghm utd  Check labs See After Visit  Summary for Counseling Recommendations    1. Preventative health care Check labs See above - Lipid panel - Comprehensive metabolic panel - CBC with Differential/Platelet  2. Hyperlipidemia, unspecified hyperlipidemia type endo  3. Uncontrolled type 2 diabetes mellitus without complication, without long-term current use of insulin ([place]) Per endo  4. Essential hypertension Stable Well controlled, no changes to meds. Encouraged heart healthy diet such as the DASH diet and exercise as tolerated.

## 2017-07-02 ENCOUNTER — Encounter: Payer: Self-pay | Admitting: Family Medicine

## 2017-07-02 NOTE — Assessment & Plan Note (Signed)
Check labs  Pt sees endo

## 2017-07-02 NOTE — Assessment & Plan Note (Signed)
Stable  con't meds Well controlled, no changes to meds. Encouraged heart healthy diet such as the DASH diet and exercise as tolerated.  

## 2017-07-02 NOTE — Assessment & Plan Note (Signed)
Per endo °

## 2017-08-01 ENCOUNTER — Ambulatory Visit (HOSPITAL_BASED_OUTPATIENT_CLINIC_OR_DEPARTMENT_OTHER)
Admission: RE | Admit: 2017-08-01 | Discharge: 2017-08-01 | Disposition: A | Discharge status: Home / Self Care | Payer: 59 | Source: Ambulatory Visit | Attending: Family Medicine | Admitting: Family Medicine

## 2017-08-01 ENCOUNTER — Encounter (HOSPITAL_BASED_OUTPATIENT_CLINIC_OR_DEPARTMENT_OTHER): Payer: Self-pay

## 2017-08-01 ENCOUNTER — Other Ambulatory Visit: Payer: Self-pay | Admitting: Family Medicine

## 2017-08-01 DIAGNOSIS — Z1231 Encounter for screening mammogram for malignant neoplasm of breast: Secondary | ICD-10-CM

## 2017-08-02 ENCOUNTER — Telehealth: Payer: Self-pay | Admitting: General Practice

## 2017-08-02 ENCOUNTER — Encounter: Payer: Self-pay | Admitting: General Practice

## 2017-08-02 NOTE — Telephone Encounter (Signed)
t called the office wanting a CPE appt with Dr. Birdie Riddle. Pt was advised that she had a CPE in August with Dr. Etter Sjogren (pt was scheduled a lab appointment for her CPE labs)  Pt would like to transfer back to Dr. Birdie Riddle please advise if ok?

## 2017-08-03 NOTE — Telephone Encounter (Signed)
ok 

## 2017-08-04 NOTE — Telephone Encounter (Signed)
I'm out of Office 

## 2017-08-07 ENCOUNTER — Other Ambulatory Visit: Payer: Self-pay | Admitting: Family Medicine

## 2017-08-07 ENCOUNTER — Other Ambulatory Visit: Payer: 59

## 2017-08-07 DIAGNOSIS — D649 Anemia, unspecified: Secondary | ICD-10-CM

## 2017-08-07 DIAGNOSIS — E785 Hyperlipidemia, unspecified: Secondary | ICD-10-CM

## 2017-08-07 LAB — CBC WITH DIFFERENTIAL/PLATELET
BASOS ABS: 0 10*3/uL (ref 0.0–0.1)
Basophils Relative: 0.7 % (ref 0.0–3.0)
EOS ABS: 0.2 10*3/uL (ref 0.0–0.7)
Eosinophils Relative: 4.3 % (ref 0.0–5.0)
HEMATOCRIT: 35 % — AB (ref 36.0–46.0)
HEMOGLOBIN: 11.3 g/dL — AB (ref 12.0–15.0)
LYMPHS PCT: 37.1 % (ref 12.0–46.0)
Lymphs Abs: 1.6 10*3/uL (ref 0.7–4.0)
MCHC: 32.3 g/dL (ref 30.0–36.0)
MCV: 90.5 fl (ref 78.0–100.0)
MONOS PCT: 9.7 % (ref 3.0–12.0)
Monocytes Absolute: 0.4 10*3/uL (ref 0.1–1.0)
NEUTROS ABS: 2.1 10*3/uL (ref 1.4–7.7)
Neutrophils Relative %: 48.2 % (ref 43.0–77.0)
Platelets: 226 10*3/uL (ref 150.0–400.0)
RBC: 3.87 Mil/uL (ref 3.87–5.11)
RDW: 14.1 % (ref 11.5–15.5)
WBC: 4.4 10*3/uL (ref 4.0–10.5)

## 2017-08-07 LAB — COMPREHENSIVE METABOLIC PANEL
ALBUMIN: 3.8 g/dL (ref 3.5–5.2)
ALT: 11 U/L (ref 0–35)
AST: 10 U/L (ref 0–37)
Alkaline Phosphatase: 57 U/L (ref 39–117)
BILIRUBIN TOTAL: 0.4 mg/dL (ref 0.2–1.2)
BUN: 19 mg/dL (ref 6–23)
CALCIUM: 9.4 mg/dL (ref 8.4–10.5)
CHLORIDE: 103 meq/L (ref 96–112)
CO2: 28 meq/L (ref 19–32)
CREATININE: 1.01 mg/dL (ref 0.40–1.20)
GFR: 72.39 mL/min (ref >60.00)
Glucose, Bld: 95 mg/dL (ref 70–99)
Potassium: 4.1 mEq/L (ref 3.5–5.1)
Sodium: 139 mEq/L (ref 135–145)
Total Protein: 6.7 g/dL (ref 6.0–8.3)

## 2017-08-07 LAB — LIPID PANEL
CHOL/HDL RATIO: 4
CHOLESTEROL: 200 mg/dL (ref 0–200)
HDL: 52.3 mg/dL (ref >39.00)
LDL CALC: 122 mg/dL — AB (ref 0–99)
NonHDL: 147.6
TRIGLYCERIDES: 127 mg/dL (ref 0.0–149.0)
VLDL: 25.4 mg/dL (ref 0.0–40.0)

## 2017-08-08 NOTE — Telephone Encounter (Signed)
Ok to transfer back.  

## 2017-08-09 NOTE — Telephone Encounter (Signed)
Can you schedule pt? 

## 2017-08-09 NOTE — Telephone Encounter (Signed)
Pt has been scheduled.  °

## 2017-08-31 DIAGNOSIS — E1165 Type 2 diabetes mellitus with hyperglycemia: Secondary | ICD-10-CM | POA: Diagnosis not present

## 2017-08-31 DIAGNOSIS — I10 Essential (primary) hypertension: Secondary | ICD-10-CM | POA: Diagnosis not present

## 2017-08-31 DIAGNOSIS — Z794 Long term (current) use of insulin: Secondary | ICD-10-CM | POA: Diagnosis not present

## 2017-09-01 LAB — HEMOGLOBIN A1C: Hemoglobin A1C: 7.5

## 2017-10-16 DIAGNOSIS — L97522 Non-pressure chronic ulcer of other part of left foot with fat layer exposed: Secondary | ICD-10-CM | POA: Diagnosis not present

## 2017-10-16 DIAGNOSIS — M2042 Other hammer toe(s) (acquired), left foot: Secondary | ICD-10-CM | POA: Diagnosis not present

## 2017-10-27 DIAGNOSIS — M545 Low back pain: Secondary | ICD-10-CM | POA: Diagnosis not present

## 2017-10-27 DIAGNOSIS — R109 Unspecified abdominal pain: Secondary | ICD-10-CM | POA: Diagnosis not present

## 2017-11-20 ENCOUNTER — Ambulatory Visit: Payer: 59 | Admitting: Family Medicine

## 2017-11-22 ENCOUNTER — Ambulatory Visit: Payer: 59 | Admitting: Family Medicine

## 2017-12-12 DIAGNOSIS — L97522 Non-pressure chronic ulcer of other part of left foot with fat layer exposed: Secondary | ICD-10-CM | POA: Diagnosis not present

## 2017-12-12 DIAGNOSIS — M2042 Other hammer toe(s) (acquired), left foot: Secondary | ICD-10-CM | POA: Diagnosis not present

## 2017-12-15 ENCOUNTER — Other Ambulatory Visit: Payer: Self-pay

## 2017-12-15 ENCOUNTER — Encounter: Payer: Self-pay | Admitting: Family Medicine

## 2017-12-15 ENCOUNTER — Ambulatory Visit: Payer: 59 | Admitting: Family Medicine

## 2017-12-15 VITALS — BP 122/80 | HR 76 | Temp 98.3°F | Resp 17 | Ht 68.0 in | Wt 211.4 lb

## 2017-12-15 DIAGNOSIS — E1165 Type 2 diabetes mellitus with hyperglycemia: Secondary | ICD-10-CM

## 2017-12-15 DIAGNOSIS — I10 Essential (primary) hypertension: Secondary | ICD-10-CM

## 2017-12-15 DIAGNOSIS — E785 Hyperlipidemia, unspecified: Secondary | ICD-10-CM

## 2017-12-15 LAB — CBC WITH DIFFERENTIAL/PLATELET
BASOS ABS: 0 10*3/uL (ref 0.0–0.1)
Basophils Relative: 0.3 % (ref 0.0–3.0)
EOS PCT: 3.1 % (ref 0.0–5.0)
Eosinophils Absolute: 0.1 10*3/uL (ref 0.0–0.7)
HEMATOCRIT: 35.7 % — AB (ref 36.0–46.0)
Hemoglobin: 11.7 g/dL — ABNORMAL LOW (ref 12.0–15.0)
LYMPHS PCT: 33.7 % (ref 12.0–46.0)
Lymphs Abs: 1.5 10*3/uL (ref 0.7–4.0)
MCHC: 32.8 g/dL (ref 30.0–36.0)
MCV: 89.7 fl (ref 78.0–100.0)
MONOS PCT: 8.8 % (ref 3.0–12.0)
Monocytes Absolute: 0.4 10*3/uL (ref 0.1–1.0)
Neutro Abs: 2.4 10*3/uL (ref 1.4–7.7)
Neutrophils Relative %: 54.1 % (ref 43.0–77.0)
PLATELETS: 259 10*3/uL (ref 150.0–400.0)
RBC: 3.98 Mil/uL (ref 3.87–5.11)
RDW: 14.8 % (ref 11.5–15.5)
WBC: 4.4 10*3/uL (ref 4.0–10.5)

## 2017-12-15 LAB — LIPID PANEL
Cholesterol: 210 mg/dL — ABNORMAL HIGH (ref 0–200)
HDL: 55.4 mg/dL (ref >39.00)
LDL Cholesterol: 137 mg/dL — ABNORMAL HIGH (ref 0–99)
NonHDL: 155.07
TRIGLYCERIDES: 91 mg/dL (ref 0.0–149.0)
Total CHOL/HDL Ratio: 4
VLDL: 18.2 mg/dL (ref 0.0–40.0)

## 2017-12-15 LAB — BASIC METABOLIC PANEL
BUN: 25 mg/dL — AB (ref 6–23)
CHLORIDE: 104 meq/L (ref 96–112)
CO2: 32 meq/L (ref 19–32)
CREATININE: 0.97 mg/dL (ref 0.40–1.20)
Calcium: 9.2 mg/dL (ref 8.4–10.5)
GFR: 75.75 mL/min (ref >60.00)
Glucose, Bld: 71 mg/dL (ref 70–99)
POTASSIUM: 4.1 meq/L (ref 3.5–5.1)
Sodium: 141 mEq/L (ref 135–145)

## 2017-12-15 LAB — HEPATIC FUNCTION PANEL
ALBUMIN: 4 g/dL (ref 3.5–5.2)
ALK PHOS: 62 U/L (ref 39–117)
ALT: 13 U/L (ref 0–35)
AST: 11 U/L (ref 0–37)
Bilirubin, Direct: 0.1 mg/dL (ref 0.0–0.3)
TOTAL PROTEIN: 6.7 g/dL (ref 6.0–8.3)
Total Bilirubin: 0.5 mg/dL (ref 0.2–1.2)

## 2017-12-15 LAB — TSH: TSH: 1.67 u[IU]/mL (ref 0.35–4.50)

## 2017-12-15 NOTE — Assessment & Plan Note (Signed)
Chronic problem.  Pt stopped her Lipitor w/o giving a reason.  Check labs.  Adjust meds prn

## 2017-12-15 NOTE — Progress Notes (Signed)
   Subjective:    Patient ID: Kathleen Acevedo, female    DOB: 1960/12/25, 57 y.o.   MRN: [id number]  HPI HTN- chronic problem, on Lisinopril HCTZ w/ good control.  No CP, SOB, HAs, visual changes.  Pt reports she is going to [employer] and is walking on her breaks at work.  Hyperlipidemia- chronic problem.  Pt is not currently on statin (previously on Lipitor).  No abd pain, N/V.  DM- chronic problem, following w/ Dr Posey Pronto.  UTD on eye exam, foot exam.  On ACE for renal protection.  Last A1C- 'no higher than 8' (8.1)  Pt reports podiatry wants to correct her hammertoe and would like my clearance to proceed  UTD on flu shot   Review of Systems For ROS see HPI     Objective:   Physical Exam  Constitutional: She is oriented to person, place, and time. She appears well-developed and well-nourished. No distress.  HENT:  Head: Normocephalic and atraumatic.  Eyes: Conjunctivae and EOM are normal. Pupils are equal, round, and reactive to light.  Neck: Normal range of motion. Neck supple. No thyromegaly present.  Cardiovascular: Normal rate, regular rhythm, normal heart sounds and intact distal pulses.  No murmur heard. Pulmonary/Chest: Effort normal and breath sounds normal. No respiratory distress.  Abdominal: Soft. She exhibits no distension. There is no tenderness.  Musculoskeletal: She exhibits no edema.  Lymphadenopathy:    She has no cervical adenopathy.  Neurological: She is alert and oriented to person, place, and time.  Skin: Skin is warm and dry.  Psychiatric: She has a normal mood and affect. Her behavior is normal.  Vitals reviewed.         Assessment & Plan:

## 2017-12-15 NOTE — Assessment & Plan Note (Signed)
Chronic problem.  Following w/ Dr Posey Pronto.  In last note from November 2018, Dr Posey Pronto indicates pt has not been compliant w/ tx or f/u.  UTD on eye exam, foot exam, and on ACE.  Will follow along.

## 2017-12-15 NOTE — Patient Instructions (Signed)
Schedule your complete physical in 6 months We'll notify you of your lab results and make any changes if needed Continue to work on healthy diet and regular exercise- you can do it! Follow up with Dr Posey Pronto as directed Call with any questions or concerns Have a great weekend!

## 2017-12-15 NOTE — Assessment & Plan Note (Signed)
Chronic problem.  Adequately controlled today.  Asymptomatic.  Check labs.  No anticipated med changes.  Will follow.

## 2017-12-18 ENCOUNTER — Other Ambulatory Visit: Payer: Self-pay | Admitting: General Practice

## 2017-12-18 DIAGNOSIS — E785 Hyperlipidemia, unspecified: Secondary | ICD-10-CM

## 2017-12-18 MED ORDER — ATORVASTATIN CALCIUM 20 MG PO TABS: 20.0000 mg | ORAL_TABLET | Freq: Every day | ORAL | 3 refills | Status: DC

## 2018-01-29 ENCOUNTER — Other Ambulatory Visit (INDEPENDENT_AMBULATORY_CARE_PROVIDER_SITE_OTHER): Payer: 59

## 2018-01-29 DIAGNOSIS — D649 Anemia, unspecified: Secondary | ICD-10-CM

## 2018-01-29 DIAGNOSIS — E785 Hyperlipidemia, unspecified: Secondary | ICD-10-CM

## 2018-01-29 LAB — CBC WITH DIFFERENTIAL/PLATELET
BASOS PCT: 0.7 % (ref 0.0–3.0)
Basophils Absolute: 0 10*3/uL (ref 0.0–0.1)
EOS PCT: 5.2 % — AB (ref 0.0–5.0)
Eosinophils Absolute: 0.2 10*3/uL (ref 0.0–0.7)
HCT: 36.1 % (ref 36.0–46.0)
HEMOGLOBIN: 11.8 g/dL — AB (ref 12.0–15.0)
Lymphocytes Relative: 31.5 % (ref 12.0–46.0)
Lymphs Abs: 1.4 10*3/uL (ref 0.7–4.0)
MCHC: 32.7 g/dL (ref 30.0–36.0)
MCV: 90.4 fl (ref 78.0–100.0)
MONO ABS: 0.4 10*3/uL (ref 0.1–1.0)
MONOS PCT: 7.8 % (ref 3.0–12.0)
Neutro Abs: 2.5 10*3/uL (ref 1.4–7.7)
Neutrophils Relative %: 54.8 % (ref 43.0–77.0)
Platelets: 222 10*3/uL (ref 150.0–400.0)
RBC: 3.99 Mil/uL (ref 3.87–5.11)
RDW: 14.3 % (ref 11.5–15.5)
WBC: 4.6 10*3/uL (ref 4.0–10.5)

## 2018-01-29 LAB — COMPREHENSIVE METABOLIC PANEL
ALBUMIN: 3.7 g/dL (ref 3.5–5.2)
ALK PHOS: 56 U/L (ref 39–117)
ALT: 15 U/L (ref 0–35)
AST: 14 U/L (ref 0–37)
BUN: 28 mg/dL — AB (ref 6–23)
CALCIUM: 9.7 mg/dL (ref 8.4–10.5)
CHLORIDE: 102 meq/L (ref 96–112)
CO2: 28 mEq/L (ref 19–32)
Creatinine, Ser: 1.11 mg/dL (ref 0.40–1.20)
GFR: 64.81 mL/min (ref >60.00)
Glucose, Bld: 186 mg/dL — ABNORMAL HIGH (ref 70–99)
POTASSIUM: 4.2 meq/L (ref 3.5–5.1)
SODIUM: 138 meq/L (ref 135–145)
TOTAL PROTEIN: 6.9 g/dL (ref 6.0–8.3)
Total Bilirubin: 0.3 mg/dL (ref 0.2–1.2)

## 2018-01-29 LAB — LIPID PANEL
Cholesterol: 196 mg/dL (ref 0–200)
HDL: 46.9 mg/dL (ref >39.00)
LDL CALC: 125 mg/dL — AB (ref 0–99)
NonHDL: 148.67
TRIGLYCERIDES: 118 mg/dL (ref 0.0–149.0)
Total CHOL/HDL Ratio: 4
VLDL: 23.6 mg/dL (ref 0.0–40.0)

## 2018-01-29 LAB — IBC PANEL
Iron: 54 ug/dL (ref 42–145)
Saturation Ratios: 15 % — ABNORMAL LOW (ref 20.0–50.0)
Transferrin: 257 mg/dL (ref 212.0–360.0)

## 2018-01-29 LAB — FERRITIN: Ferritin: 15.1 ng/mL (ref 10.0–291.0)

## 2018-02-01 ENCOUNTER — Other Ambulatory Visit: Payer: Self-pay

## 2018-02-01 MED ORDER — ROSUVASTATIN CALCIUM 10 MG PO TABS: 10.0000 mg | ORAL_TABLET | Freq: Every day | ORAL | 2 refills | Status: DC

## 2018-02-01 NOTE — Progress Notes (Signed)
cre 

## 2018-02-22 ENCOUNTER — Encounter: Payer: Self-pay | Admitting: Medical

## 2018-02-22 ENCOUNTER — Ambulatory Visit: Payer: 59 | Admitting: Medical

## 2018-02-22 ENCOUNTER — Telehealth: Payer: Self-pay | Admitting: Family Medicine

## 2018-02-22 VITALS — BP 143/75 | HR 55 | Temp 98.1°F | Resp 16 | Ht 68.0 in | Wt 217.8 lb

## 2018-02-22 DIAGNOSIS — R05 Cough: Secondary | ICD-10-CM | POA: Diagnosis not present

## 2018-02-22 DIAGNOSIS — J40 Bronchitis, not specified as acute or chronic: Secondary | ICD-10-CM | POA: Diagnosis not present

## 2018-02-22 DIAGNOSIS — R0981 Nasal congestion: Secondary | ICD-10-CM

## 2018-02-22 DIAGNOSIS — R059 Cough, unspecified: Secondary | ICD-10-CM

## 2018-02-22 MED ORDER — HYDROCODONE-HOMATROPINE 5-1.5 MG/5ML PO SYRP: 5.0000 mL | ORAL_SOLUTION | Freq: Four times a day (QID) | ORAL | 0 refills | Status: DC | PRN

## 2018-02-22 MED ORDER — FLUTICASONE PROPIONATE 50 MCG/ACT NA SUSP: 2.0000 | Freq: Every day | NASAL | 1 refills | Status: DC

## 2018-02-22 MED ORDER — AZITHROMYCIN 250 MG PO TABS: ORAL_TABLET | ORAL | 0 refills | Status: DC

## 2018-02-22 NOTE — Telephone Encounter (Signed)
See phone note 08/02/2017

## 2018-02-22 NOTE — Telephone Encounter (Signed)
She called in oct 2018 wanting to switch back to [facility]

## 2018-02-22 NOTE — Telephone Encounter (Signed)
Pt stated that she went to Dr. Birdie Riddle but she is now 30 mins away and she didn't know that Dr. Birdie Riddle was that far away. Are you willing to accept her back?

## 2018-02-22 NOTE — Progress Notes (Signed)
Subjective:    Patient ID: Kathleen Acevedo, female    DOB: Apr 30, 1961, 57 y.o.   MRN: [id number]  HPI  Pt in with st and cough. Pain in throat is mild-moderate. She has raspy voice. Cough is keeping her up at night.  Symptoms started past Saturday/almost a week. She was at [facility].  No wheezing.  Cough mostly dry. Rare productive cough.  Pt tried some diabetic tussin and did not help.  Pt sugars recently 118-160 fasting in the am.   Review of Systems  Constitutional: Positive for fatigue. Negative for chills and fever.  HENT: Positive for congestion, postnasal drip and sore throat. Negative for sinus pressure and sinus pain.        Raspy voice.  Respiratory: Positive for cough. Negative for shortness of breath and wheezing.   Cardiovascular: Negative for chest pain and palpitations.  Gastrointestinal: Negative for abdominal pain.  Musculoskeletal: Negative for back pain and neck pain.  Skin: Negative for rash.  Neurological: Negative for dizziness and headaches.  Hematological: Negative for adenopathy. Does not bruise/bleed easily.  Psychiatric/Behavioral: Negative for behavioral problems and confusion.    Past Medical History:  Diagnosis Date  . Diabetes mellitus   . Hyperlipidemia   . Hypertension      Social History   Socioeconomic History  . Marital status: Married    Spouse name: Not on file  . Number of children: Not on file  . Years of education: Not on file  . Highest education level: Not on file  Occupational History  . Occupation: Therapist, music  Social Needs  . Financial resource strain: Not on file  . Food insecurity:    Worry: Not on file    Inability: Not on file  . Transportation needs:    Medical: Not on file    Non-medical: Not on file  Tobacco Use  . Smoking status: Never Smoker  . Smokeless tobacco: Never Used  Substance and Sexual Activity  . Alcohol use: No    Alcohol/week: 0.0 oz  . Drug use: No  . Sexual activity: Yes   Partners: Male  Lifestyle  . Physical activity:    Days per week: Not on file    Minutes per session: Not on file  . Stress: Not on file  Relationships  . Social connections:    Talks on phone: Not on file    Gets together: Not on file    Attends religious service: Not on file    Active member of club or organization: Not on file    Attends meetings of clubs or organizations: Not on file    Relationship status: Not on file  . Intimate partner violence:    Fear of current or ex partner: Not on file    Emotionally abused: Not on file    Physically abused: Not on file    Forced sexual activity: Not on file  Other Topics Concern  . Not on file  Social History Narrative   Exercise --  4-5 days a week-- walking     Past Surgical History:  Procedure Laterality Date  . ABDOMINAL HYSTERECTOMY    . BREAST BIOPSY Left   . OOPHORECTOMY      Family History  Problem Relation Age of Onset  . Diabetes Father   . Colon cancer Neg Hx   . Colon polyps Neg Hx     No Known Allergies  Current Outpatient Medications on File Prior to Visit  Medication Sig Dispense Refill  .  atorvastatin (LIPITOR) 20 MG tablet Take 1 tablet (20 mg total) by mouth daily. 30 tablet 3  . empagliflozin (JARDIANCE) 25 MG TABS tablet Take 25 mg by mouth daily.    . Ferrous Sulfate (IRON) 325 (65 FE) MG TABS Take by mouth daily.      [name] glucose blood (ONE TOUCH ULTRA TEST) test strip 1 each by Other route 2 (two) times daily. Use as instructed     . Insulin Glargine (BASAGLAR KWIKPEN) 100 UNIT/ML SOPN Inject 40 Units into the skin every morning.    [name] lisinopril-hydrochlorothiazide (PRINZIDE,ZESTORETIC) 20-12.5 MG per tablet Take 1 tablet by mouth daily.    . metFORMIN (GLUCOPHAGE) 1000 MG tablet Take 1 tablet (1,000 mg total) by mouth 2 (two) times daily. 60 tablet 0  . Multiple Vitamin (MULTIVITAMIN PO) Take by mouth daily.      Glory Rosebush DELICA LANCETS MISC by Does not apply route 2 (two) times daily.      .  rosuvastatin (CRESTOR) 10 MG tablet Take 1 tablet (10 mg total) by mouth at bedtime. 30 tablet 2   No current facility-administered medications on file prior to visit.     BP (!) 143/75   Pulse (!) 55   Temp 98.1 F (36.7 C) (Oral)   Resp 16   Ht 5\' 8"  (1.727 m)   Wt 217 lb 12.8 oz (98.8 kg)   SpO2 100%   BMI 33.12 kg/m      Objective:   Physical Exam  General  Mental Status - Alert. General Appearance - Well groomed. Not in acute distress.  Skin Rashes- No Rashes.  HEENT Head- Normal. Ear Auditory Canal - Left- Normal. Right - Normal.Tympanic Membrane- Left- Normal. Right- Normal. Eye Sclera/Conjunctiva- Left- Normal. Right- Normal. Nose & Sinuses Nasal Mucosa- Left-  Boggy and Congested. Right-  Boggy and  Congested.Bilateral maxillary and frontal sinus pressure. Mouth & Throat Lips: Upper Lip- Normal: no dryness, cracking, pallor, cyanosis, or vesicular eruption. Lower Lip-Normal: no dryness, cracking, pallor, cyanosis or vesicular eruption. Buccal Mucosa- Bilateral- No Aphthous ulcers. Oropharynx- No Discharge or Erythema. +pnd Tonsils: Characteristics- Bilateral- No Erythema or Congestion. Size/Enlargement- Bilateral- No enlargement. Discharge- bilateral-None.  Neck Neck- Supple. No Masses.   Chest and Lung Exam Auscultation: Breath Sounds:-Clear even and unlabored.  Cardiovascular Auscultation:Rythm- Regular, rate and rhythm. Murmurs & Other Heart Sounds:Ausculatation of the heart reveal- No Murmurs.  Lymphatic Head & Neck General Head & Neck Lymphatics: Bilateral: Description- No Localized lymphadenopathy.       Assessment & Plan:  You do  appear to have  probable allergic rhinitis symptoms and now developing probable bronchitis.  For nasal congestion,   I prescribed flonase nasal spray.  For cough, I prescribed Hycodan.   For bronchitis, I prescribed azithromycin.   Follow-up in 7 days or as needed.   If severe cough persists despite  Hycodan please let us know.    In that event would get chest x-ray.  Mackie Pai, PA-C

## 2018-02-22 NOTE — Telephone Encounter (Signed)
Ok

## 2018-02-22 NOTE — Telephone Encounter (Signed)
Copied from [phone]. Topic: Quick Communication - See Telephone Encounter >> Feb 22, 2018  9:33 AM Genella Rife H wrote: CRM for notification. See Telephone encounter for: 02/22/18.  Pt's PCP is showing as Dr. Annye Asa pt states that she is Dr. Megan Mans pt? Please advise.

## 2018-02-22 NOTE — Patient Instructions (Signed)
You do  appear to have  probable allergic rhinitis symptoms and now developing probable bronchitis.  For nasal congestion,   I prescribed flonase nasal spray.  For cough, I prescribed Hycodan.   For bronchitis, I prescribed azithromycin.   Follow-up in 7 days or as needed.   If severe cough persists despite Hycodan please let us know.    In that event would get chest x-ray

## 2018-03-01 ENCOUNTER — Ambulatory Visit: Payer: 59 | Admitting: Family Medicine

## 2018-03-16 ENCOUNTER — Ambulatory Visit: Payer: 59 | Admitting: Family Medicine

## 2018-03-16 ENCOUNTER — Encounter: Payer: Self-pay | Admitting: Family Medicine

## 2018-03-16 VITALS — BP 128/61 | HR 60 | Temp 97.7°F | Resp 16 | Wt 218.4 lb

## 2018-03-16 DIAGNOSIS — E785 Hyperlipidemia, unspecified: Secondary | ICD-10-CM | POA: Diagnosis not present

## 2018-03-16 DIAGNOSIS — E1165 Type 2 diabetes mellitus with hyperglycemia: Secondary | ICD-10-CM | POA: Diagnosis not present

## 2018-03-16 DIAGNOSIS — E1151 Type 2 diabetes mellitus with diabetic peripheral angiopathy without gangrene: Secondary | ICD-10-CM

## 2018-03-16 DIAGNOSIS — I10 Essential (primary) hypertension: Secondary | ICD-10-CM | POA: Diagnosis not present

## 2018-03-16 DIAGNOSIS — IMO0002 Reserved for concepts with insufficient information to code with codable children: Secondary | ICD-10-CM

## 2018-03-16 MED ORDER — METFORMIN HCL 1000 MG PO TABS: 1000.0000 mg | ORAL_TABLET | Freq: Two times a day (BID) | ORAL | 0 refills | Status: DC

## 2018-03-16 MED ORDER — LISINOPRIL-HYDROCHLOROTHIAZIDE 20-12.5 MG PO TABS: 1.0000 | ORAL_TABLET | Freq: Every day | ORAL | 1 refills | Status: DC

## 2018-03-16 MED ORDER — EMPAGLIFLOZIN 25 MG PO TABS: 25.0000 mg | ORAL_TABLET | Freq: Every day | ORAL | 1 refills | Status: DC

## 2018-03-16 NOTE — Progress Notes (Signed)
Subjective:  I acted as a Education administrator for [employer]. Yancey Flemings, [place]   Patient ID: Kathleen Acevedo, female    DOB: 04-Nov-1960, 57 y.o.   MRN: [id number]  Chief Complaint  Patient presents with  . discuss endocrinologist    HPI  Patient is in today to discuss endocrinologist.  She did not like the endo so she does not want to go back and has not been for that reason.  No other complaints.  HYPERTENSION   Blood pressure range-not checking   Chest pain- no      Dyspnea- no Lightheadedness- no   Edema- no  Other side effects - no   Medication compliance:good Low salt diet- yes    DIABETES    Blood Sugar ranges-not checking  Polyuria- no New Visual problems- no  Hypoglycemic symptoms- no  Other side effects-no Medication compliance - good Last eye exam- due Foot exam- today   HYPERLIPIDEMIA  Medication compliance- good RUQ pain- no  Muscle aches- no Other side effects-no   Patient Care Team: Ann Held, DO as PCP - General (Family Medicine) Amalia Greenhouse, MD as Consulting Physician (Endocrinology) Amalia Greenhouse, MD as Referring Physician (Endocrinology) Dial, Sherrie [name], DPM as Referring Physician (Podiatry) Melchor Amour, MD as Referring Physician (Ophthalmology)   Past Medical History:  Diagnosis Date  . Diabetes mellitus   . Hyperlipidemia   . Hypertension     Past Surgical History:  Procedure Laterality Date  . ABDOMINAL HYSTERECTOMY    . BREAST BIOPSY Left   . OOPHORECTOMY      Family History  Problem Relation Age of Onset  . Diabetes Father   . Colon cancer Neg Hx   . Colon polyps Neg Hx     Social History   Socioeconomic History  . Marital status: Married    Spouse name: Not on file  . Number of children: Not on file  . Years of education: Not on file  . Highest education level: Not on file  Occupational History  . Occupation: Therapist, music  Social Needs  . Financial resource strain: Not on file  . Food insecurity:   Worry: Not on file    Inability: Not on file  . Transportation needs:    Medical: Not on file    Non-medical: Not on file  Tobacco Use  . Smoking status: Never Smoker  . Smokeless tobacco: Never Used  Substance and Sexual Activity  . Alcohol use: No    Alcohol/week: 0.0 oz  . Drug use: No  . Sexual activity: Yes    Partners: Male  Lifestyle  . Physical activity:    Days per week: Not on file    Minutes per session: Not on file  . Stress: Not on file  Relationships  . Social connections:    Talks on phone: Not on file    Gets together: Not on file    Attends religious service: Not on file    Active member of club or organization: Not on file    Attends meetings of clubs or organizations: Not on file    Relationship status: Not on file  . Intimate partner violence:    Fear of current or ex partner: Not on file    Emotionally abused: Not on file    Physically abused: Not on file    Forced sexual activity: Not on file  Other Topics Concern  . Not on file  Social History Narrative   Exercise --  4-5 days  a week-- walking     Outpatient Medications Prior to Visit  Medication Sig Dispense Refill  . atorvastatin (LIPITOR) 20 MG tablet Take 1 tablet (20 mg total) by mouth daily. 30 tablet 3  . azithromycin (ZITHROMAX) 250 MG tablet Take 2 tablets by mouth on day 1, followed by 1 tablet by mouth daily for 4 days. 6 tablet 0  . Ferrous Sulfate (IRON) 325 (65 FE) MG TABS Take by mouth daily.      . fluticasone (FLONASE) 50 MCG/ACT nasal spray Place 2 sprays into both nostrils daily. 16 g 1  . glucose blood (ONE TOUCH ULTRA TEST) test strip 1 each by Other route 2 (two) times daily. Use as instructed     . HYDROcodone-homatropine (HYCODAN) 5-1.5 MG/5ML syrup Take 5 mLs by mouth every 6 (six) hours as needed for cough. 100 mL 0  . Insulin Glargine (BASAGLAR KWIKPEN) 100 UNIT/ML SOPN Inject 40 Units into the skin every morning.    . Multiple Vitamin (MULTIVITAMIN PO) Take by mouth  daily.      Glory Rosebush DELICA LANCETS MISC by Does not apply route 2 (two) times daily.      . rosuvastatin (CRESTOR) 10 MG tablet Take 1 tablet (10 mg total) by mouth at bedtime. 30 tablet 2  . empagliflozin (JARDIANCE) 25 MG TABS tablet Take 25 mg by mouth daily.    [name] lisinopril-hydrochlorothiazide (PRINZIDE,ZESTORETIC) 20-12.5 MG per tablet Take 1 tablet by mouth daily.    . metFORMIN (GLUCOPHAGE) 1000 MG tablet Take 1 tablet (1,000 mg total) by mouth 2 (two) times daily. 60 tablet 0   No facility-administered medications prior to visit.     No Known Allergies  Review of Systems  Constitutional: Negative for fever and malaise/fatigue.  HENT: Negative for congestion.   Eyes: Negative for blurred vision.  Respiratory: Negative for cough and shortness of breath.   Cardiovascular: Negative for chest pain, palpitations and leg swelling.  Gastrointestinal: Negative for vomiting.  Musculoskeletal: Negative for back pain.  Skin: Negative for rash.  Neurological: Negative for loss of consciousness and headaches.       Objective:    Physical Exam  BP 128/61 (BP Location: Left Arm, Patient Position: Sitting, Cuff Size: Large)   Pulse 60   Temp 97.7 F (36.5 C) (Oral)   Resp 16   Wt 218 lb 6.4 oz (99.1 kg)   SpO2 100%   BMI 33.21 kg/m  Wt Readings from Last 3 Encounters:  03/16/18 218 lb 6.4 oz (99.1 kg)  02/22/18 217 lb 12.8 oz (98.8 kg)  12/15/17 211 lb 6 oz (95.9 kg)   BP Readings from Last 3 Encounters:  03/16/18 128/61  02/22/18 (!) 143/75  12/15/17 122/80     Immunization History  Administered Date(s) Administered  . Influenza, Seasonal, Injecte, Preservative Fre 08/07/2015  . Influenza,inj,Quad PF,6+ Mos 07/15/2016, 08/14/2017  . Influenza-Unspecified 07/20/2013, 07/31/2014  . Pneumococcal Polysaccharide-23 11/26/2008  . Tdap 12/01/2008    Health Maintenance  Topic Date Due  . HEMOGLOBIN A1C  03/01/2018  . FOOT EXAM  03/15/2018  . PNEUMOCOCCAL  POLYSACCHARIDE VACCINE (2) 12/15/2018 (Originally 11/26/2013)  . HIV Screening  12/15/2018 (Originally 07/24/1974)  . OPHTHALMOLOGY EXAM  04/28/2018  . INFLUENZA VACCINE  05/31/2018  . MAMMOGRAM  08/01/2018  . TETANUS/TDAP  12/01/2018  . COLONOSCOPY  04/20/2023  . Hepatitis C Screening  Completed    Lab Results  Component Value Date   WBC 4.6 01/29/2018   HGB 11.8 (L) 01/29/2018  HCT 36.1 01/29/2018   PLT 222.0 01/29/2018   GLUCOSE 186 (H) 01/29/2018   CHOL 196 01/29/2018   TRIG 118.0 01/29/2018   HDL 46.90 01/29/2018   LDLDIRECT 103.6 08/23/2012   LDLCALC 125 (H) 01/29/2018   ALT 15 01/29/2018   AST 14 01/29/2018   NA 138 01/29/2018   K 4.2 01/29/2018   CL 102 01/29/2018   CREATININE 1.11 01/29/2018   BUN 28 (H) 01/29/2018   CO2 28 01/29/2018   TSH 1.67 12/15/2017   HGBA1C 7.5 09/01/2017   MICROALBUR 1.2 07/02/2010    Lab Results  Component Value Date   TSH 1.67 12/15/2017   Lab Results  Component Value Date   WBC 4.6 01/29/2018   HGB 11.8 (L) 01/29/2018   HCT 36.1 01/29/2018   MCV 90.4 01/29/2018   PLT 222.0 01/29/2018   Lab Results  Component Value Date   NA 138 01/29/2018   K 4.2 01/29/2018   CO2 28 01/29/2018   GLUCOSE 186 (H) 01/29/2018   BUN 28 (H) 01/29/2018   CREATININE 1.11 01/29/2018   BILITOT 0.3 01/29/2018   ALKPHOS 56 01/29/2018   AST 14 01/29/2018   ALT 15 01/29/2018   PROT 6.9 01/29/2018   ALBUMIN 3.7 01/29/2018   CALCIUM 9.7 01/29/2018   GFR 64.81 01/29/2018   Lab Results  Component Value Date   CHOL 196 01/29/2018   Lab Results  Component Value Date   HDL 46.90 01/29/2018   Lab Results  Component Value Date   LDLCALC 125 (H) 01/29/2018   Lab Results  Component Value Date   TRIG 118.0 01/29/2018   Lab Results  Component Value Date   CHOLHDL 4 01/29/2018   Lab Results  Component Value Date   HGBA1C 7.5 09/01/2017         Assessment & Plan:   Problem List Items Addressed This Visit      Unprioritized    Diabetes type 2, uncontrolled ([place])    hgba1c to be checked  minimize simple carbs. Increase exercise as tolerated. Continue current meds Will check labs--  Decide on endo referral after labs reviewed      Relevant Medications   empagliflozin (JARDIANCE) 25 MG TABS tablet   lisinopril-hydrochlorothiazide (PRINZIDE,ZESTORETIC) 20-12.5 MG tablet   metFORMIN (GLUCOPHAGE) 1000 MG tablet   Essential hypertension    Well controlled, no changes to meds. Encouraged heart healthy diet such as the DASH diet and exercise as tolerated.       Relevant Medications   lisinopril-hydrochlorothiazide (PRINZIDE,ZESTORETIC) 20-12.5 MG tablet   Other Relevant Orders   Lipid panel   Hemoglobin A1c   Comprehensive metabolic panel   Amb Ref to Medical Weight Management   Hyperlipidemia    Encouraged heart healthy diet, increase exercise, avoid trans fats, consider a krill oil cap daily      Relevant Medications   lisinopril-hydrochlorothiazide (PRINZIDE,ZESTORETIC) 20-12.5 MG tablet    Other Visit Diagnoses    DM (diabetes mellitus) type II uncontrolled, periph vascular disorder (HCC)    -  Primary   Relevant Medications   empagliflozin (JARDIANCE) 25 MG TABS tablet   lisinopril-hydrochlorothiazide (PRINZIDE,ZESTORETIC) 20-12.5 MG tablet   metFORMIN (GLUCOPHAGE) 1000 MG tablet   Other Relevant Orders   Lipid panel   Hemoglobin A1c   Comprehensive metabolic panel   Amb Ref to Medical Weight Management   Morbid obesity ([place])       Relevant Medications   empagliflozin (JARDIANCE) 25 MG TABS tablet   metFORMIN (GLUCOPHAGE) 1000 MG  tablet   Other Relevant Orders   Amb Ref to Medical Weight Management      I have changed [place] lisinopril-hydrochlorothiazide. I am also having her maintain her Iron, Multiple Vitamin (MULTIVITAMIN PO), ONETOUCH DELICA LANCETS, glucose blood, BASAGLAR KWIKPEN, atorvastatin, rosuvastatin, HYDROcodone-homatropine, fluticasone, azithromycin, empagliflozin, and  metFORMIN.  Meds ordered this encounter  Medications  . empagliflozin (JARDIANCE) 25 MG TABS tablet    Sig: Take 25 mg by mouth daily.    Dispense:  90 tablet    Refill:  1  . lisinopril-hydrochlorothiazide (PRINZIDE,ZESTORETIC) 20-12.5 MG tablet    Sig: Take 1 tablet by mouth daily.    Dispense:  90 tablet    Refill:  1  . metFORMIN (GLUCOPHAGE) 1000 MG tablet    Sig: Take 1 tablet (1,000 mg total) by mouth 2 (two) times daily.    Dispense:  60 tablet    Refill:  0    CMA served as scribe during this visit. History, Physical and Plan performed by medical provider. Documentation and orders reviewed and attested to.  Ann Held, DO

## 2018-03-16 NOTE — Patient Instructions (Signed)

## 2018-03-17 NOTE — Assessment & Plan Note (Signed)
Encouraged heart healthy diet, increase exercise, avoid trans fats, consider a krill oil cap daily 

## 2018-03-17 NOTE — Assessment & Plan Note (Signed)
Well controlled, no changes to meds. Encouraged heart healthy diet such as the DASH diet and exercise as tolerated.  °

## 2018-03-17 NOTE — Assessment & Plan Note (Addendum)
hgba1c to be checked  minimize simple carbs. Increase exercise as tolerated. Continue current meds Will check labs--  Decide on endo referral after labs reviewed

## 2018-03-20 ENCOUNTER — Encounter (INDEPENDENT_AMBULATORY_CARE_PROVIDER_SITE_OTHER): Payer: 59

## 2018-03-26 DIAGNOSIS — J209 Acute bronchitis, unspecified: Secondary | ICD-10-CM | POA: Diagnosis not present

## 2018-03-27 ENCOUNTER — Encounter (INDEPENDENT_AMBULATORY_CARE_PROVIDER_SITE_OTHER): Payer: Self-pay | Admitting: Family Medicine

## 2018-03-27 ENCOUNTER — Ambulatory Visit (INDEPENDENT_AMBULATORY_CARE_PROVIDER_SITE_OTHER): Payer: 59 | Admitting: Family Medicine

## 2018-03-27 VITALS — BP 117/66 | HR 77 | Temp 98.9°F | Ht 68.0 in | Wt 212.0 lb

## 2018-03-27 DIAGNOSIS — R5383 Other fatigue: Secondary | ICD-10-CM | POA: Diagnosis not present

## 2018-03-27 DIAGNOSIS — Z1331 Encounter for screening for depression: Secondary | ICD-10-CM | POA: Diagnosis not present

## 2018-03-27 DIAGNOSIS — Z0289 Encounter for other administrative examinations: Secondary | ICD-10-CM

## 2018-03-27 DIAGNOSIS — Z9189 Other specified personal risk factors, not elsewhere classified: Secondary | ICD-10-CM

## 2018-03-27 DIAGNOSIS — R0602 Shortness of breath: Secondary | ICD-10-CM

## 2018-03-27 DIAGNOSIS — E669 Obesity, unspecified: Secondary | ICD-10-CM | POA: Diagnosis not present

## 2018-03-27 DIAGNOSIS — E119 Type 2 diabetes mellitus without complications: Secondary | ICD-10-CM

## 2018-03-27 DIAGNOSIS — Z6832 Body mass index (BMI) 32.0-32.9, adult: Secondary | ICD-10-CM | POA: Diagnosis not present

## 2018-03-27 DIAGNOSIS — I10 Essential (primary) hypertension: Secondary | ICD-10-CM

## 2018-03-27 DIAGNOSIS — Z794 Long term (current) use of insulin: Secondary | ICD-10-CM | POA: Diagnosis not present

## 2018-03-27 NOTE — Progress Notes (Signed)
Office: [phone]  /  Fax: [phone]   Dear Dr. Carollee Acevedo,   Thank you for referring Kathleen Acevedo to our clinic. The following note includes my evaluation and treatment recommendations.  HPI:   Chief Complaint: OBESITY    Kathleen Acevedo has been referred by Kathleen Held, DO for consultation regarding her obesity and obesity related comorbidities.    Kathleen Acevedo (MR# [id number]) is a 57 y.o. female who presents on 03/27/2018 for obesity evaluation and treatment. Current BMI is Body mass index is 32.23 kg/m.Kathleen Acevedo has been struggling with her weight for many years and has been unsuccessful in either losing weight, maintaining weight loss, or reaching her healthy weight goal.     Kathleen Acevedo attended our information session and states she is currently in the action stage of change and ready to dedicate time achieving and maintaining a healthier weight. Kathleen Acevedo is interested in becoming our patient and working on intensive lifestyle modifications including (but not limited to) diet, exercise and weight loss.    Kathleen Acevedo states her family eats meals together she thinks her family will eat healthier with  her she struggles with family and or coworkers weight loss sabotage her desired weight loss is 32 lbs she has been heavy most of  her life she started gaining weight last year her heaviest weight ever was 270 lbs. she is a picky eater and doesn't like to eat healthier foods  she has significant food cravings issues  she snacks frequently in the evenings she skips meals frequently she is frequently drinking liquids with calories she frequently makes poor food choices she frequently eats larger portions than normal  she struggles with emotional eating    Fatigue Kathleen Acevedo feels her energy is lower than it should be. This has worsened with weight gain and has not worsened recently. Kathleen Acevedo admits to daytime somnolence and denies waking up still tired. Patient is at risk for  obstructive sleep apnea. Patent has a history of symptoms of daytime fatigue and hypertension. Patient generally gets 6 hours of sleep per night, and states they generally have restful sleep. Snoring is not present. Apneic episodes are not present. Epworth Sleepiness Score is 6  EKG was ordered today and shows poor R wave progression.  Dyspnea on exertion Kathleen Acevedo notes increasing shortness of breath with exercising and seems to be worsening over time with weight gain. She notes getting out of breath sooner with activity than she used to. This has not gotten worse recently. EKG was ordered today and shows poor R wave progression. [name] denies orthopnea.  Diabetes II Kathleen Acevedo has a diagnosis of diabetes type II. Kathleen Acevedo states fasting BGs range in the 140's, post prandial BGs range between 150 and 230 and she denies any hypoglycemic episodes. Last A1c was at 7.5 She is attempting to work on intensive lifestyle modifications including diet, exercise, and weight loss to help control her blood glucose levels.  Hypertension Kathleen Acevedo is a 57 y.o. female with hypertension. Kathleen Acevedo denies chest pain, chest pressure or headache. She is attempting to work on weight loss to help control her blood pressure with the goal of decreasing her risk of heart attack and stroke. [name] blood pressure is currently controlled.  At risk for cardiovascular disease Kathleen Acevedo is at a higher than average risk for cardiovascular disease due to obesity, diabetes and hypertension. She currently denies any chest pain.  Depression Screen Kathleen Acevedo Food and Mood (modified PHQ-9) score was  Depression screen Kathleen Acevedo 2/9  03/27/2018  Decreased Interest 2  Down, Depressed, Hopeless 1  PHQ - 2 Score 3  Altered sleeping 2  Tired, decreased energy 3  Change in appetite 3  Feeling bad or failure about yourself  3  Trouble concentrating 3  Moving slowly or fidgety/restless 0  Suicidal thoughts 0  PHQ-9 Score 17  Difficult doing  work/chores Not difficult at all    ALLERGIES: No Known Allergies  MEDICATIONS: Current Outpatient Medications on File Prior to Visit  Medication Sig Dispense Refill  . empagliflozin (JARDIANCE) 25 MG TABS tablet Take 25 mg by mouth daily. 90 tablet 1  . glucose blood (ONE TOUCH ULTRA TEST) test strip 1 each by Other route 2 (two) times daily. Use as instructed     . Insulin Glargine (BASAGLAR KWIKPEN) 100 UNIT/ML SOPN Inject 25 Units into the skin every morning.     [name] lisinopril-hydrochlorothiazide (PRINZIDE,ZESTORETIC) 20-12.5 MG tablet Take 1 tablet by mouth daily. 90 tablet 1  . metFORMIN (GLUCOPHAGE) 1000 MG tablet Take 1 tablet (1,000 mg total) by mouth 2 (two) times daily. 60 tablet 0  . ONETOUCH DELICA LANCETS MISC by Does not apply route 2 (two) times daily.       No current facility-administered medications on file prior to visit.     PAST MEDICAL HISTORY: Past Medical History:  Diagnosis Date  . Diabetes mellitus   . Hyperlipidemia   . Hypertension     PAST SURGICAL HISTORY: Past Surgical History:  Procedure Laterality Date  . ABDOMINAL HYSTERECTOMY    . BREAST BIOPSY Left   . OOPHORECTOMY      SOCIAL HISTORY: Social History   Tobacco Use  . Smoking status: Never Smoker  . Smokeless tobacco: Never Used  Substance Use Topics  . Alcohol use: No    Alcohol/week: 0.0 oz  . Drug use: No    FAMILY HISTORY: Family History  Problem Relation Age of Onset  . Diabetes Father   . High blood pressure Father   . Obesity Father   . Colon cancer Neg Hx   . Colon polyps Neg Hx     ROS: Review of Systems  Constitutional: Positive for malaise/fatigue.  Eyes:       Wear Glasses or Contacts  Respiratory: Positive for shortness of breath (on exertion).   Cardiovascular: Negative for chest pain and orthopnea.       Negative for chest pressure  Skin:       Hair or Nail Changes  Neurological: Negative for headaches.  Endo/Heme/Allergies:       Negative for  hypoglycemia    PHYSICAL EXAM: Blood pressure 117/66, pulse 77, temperature 98.9 F (37.2 C), temperature source Oral, height '5\' 8"'  (1.727 m), weight 212 lb (96.2 kg), SpO2 96 %. Body mass index is 32.23 kg/m. Physical Exam  Constitutional: She is oriented to person, place, and time. She appears well-developed and well-nourished.  HENT:  Head: Normocephalic and atraumatic.  Nose: Nose normal.  Eyes: EOM are normal. No scleral icterus.  Neck: Normal range of motion. Neck supple. No thyromegaly present.  Cardiovascular: Normal rate.  Murmur (best heard at aortic area, no radiation) heard.  Systolic murmur is present with a grade of 2/6. Pulmonary/Chest: Effort normal. No respiratory distress.  Abdominal: Soft. There is no tenderness.  + obesity  Musculoskeletal: Normal range of motion. She exhibits no edema.  Range of Motion normal in all 4 extremities  Neurological: She is alert and oriented to person, place, and time. Coordination normal.  Skin: Skin is warm and dry.  Psychiatric: She has a normal mood and affect. Her behavior is normal.  Vitals reviewed.   RECENT LABS AND TESTS: BMET    Component Value Date/Time   NA 138 01/29/2018 0725   K 4.2 01/29/2018 0725   CL 102 01/29/2018 0725   CO2 28 01/29/2018 0725   GLUCOSE 186 (H) 01/29/2018 0725   BUN 28 (H) 01/29/2018 0725   CREATININE 1.11 01/29/2018 0725   CREATININE 0.89 09/13/2013 1546   CALCIUM 9.7 01/29/2018 0725   GFRNONAA >60 09/01/2010 0822   GFRAA  09/01/2010 0822    >60        The eGFR has been calculated using the MDRD equation. This calculation has not been validated in all clinical situations. eGFR's persistently <60 mL/min signify possible Chronic Kidney Disease.   Lab Results  Component Value Date   HGBA1C 7.5 09/01/2017   No results found for: INSULIN CBC    Component Value Date/Time   WBC 4.6 01/29/2018 0725   RBC 3.99 01/29/2018 0725   HGB 11.8 (L) 01/29/2018 0725   HCT 36.1  01/29/2018 0725   PLT 222.0 01/29/2018 0725   MCV 90.4 01/29/2018 0725   MCH 29.2 09/13/2013 1546   MCHC 32.7 01/29/2018 0725   RDW 14.3 01/29/2018 0725   LYMPHSABS 1.4 01/29/2018 0725   MONOABS 0.4 01/29/2018 0725   EOSABS 0.2 01/29/2018 0725   BASOSABS 0.0 01/29/2018 0725   Iron/TIBC/Ferritin/ %Sat    Component Value Date/Time   IRON 54 01/29/2018 0725   FERRITIN 15.1 01/29/2018 0725   IRONPCTSAT 15.0 (L) 01/29/2018 0725   Lipid Panel     Component Value Date/Time   CHOL 196 01/29/2018 0725   TRIG 118.0 01/29/2018 0725   HDL 46.90 01/29/2018 0725   CHOLHDL 4 01/29/2018 0725   VLDL 23.6 01/29/2018 0725   LDLCALC 125 (H) 01/29/2018 0725   LDLDIRECT 103.6 08/23/2012 1553   Hepatic Function Panel     Component Value Date/Time   PROT 6.9 01/29/2018 0725   ALBUMIN 3.7 01/29/2018 0725   AST 14 01/29/2018 0725   ALT 15 01/29/2018 0725   ALKPHOS 56 01/29/2018 0725   BILITOT 0.3 01/29/2018 0725   BILIDIR 0.1 12/15/2017 0912   IBILI 0.2 09/13/2013 1546      Component Value Date/Time   TSH 1.67 12/15/2017 0912   TSH 1.42 02/19/2016 1109   TSH 2.02 01/14/2015 1511    ECG  shows NSR with a rate of 77 BPM INDIRECT CALORIMETER done today shows a VO2 of 215 and a REE of 1506.  Her calculated basal metabolic rate is 8676 thus her basal metabolic rate is worse than expected.    ASSESSMENT AND PLAN: Other fatigue - Plan: EKG 12-Lead, VITAMIN D 25 Hydroxy (Vit-D Deficiency, Fractures), Vitamin B12, Folate, T3, T4, free, TSH  Shortness of breath on exertion - Plan: ECHOCARDIOGRAM COMPLETE  Type 2 diabetes mellitus without complication, with long-term current use of insulin (HCC) - Plan: Comprehensive metabolic panel, Hemoglobin A1c, C-peptide  Essential hypertension  Depression screening  At risk for heart disease  Class 1 obesity with serious comorbidity and body mass index (BMI) of 32.0 to 32.9 in adult, unspecified obesity type  PLAN: Fatigue Aliveah was informed  that her fatigue may be related to obesity, depression or many other causes. Labs will be ordered, and in the meanwhile Cinthya has agreed to work on diet, exercise and weight loss to help with fatigue. Proper sleep hygiene was  discussed including the need for 7-8 hours of quality sleep each night. A sleep study was not ordered based on symptoms and Epworth score. We will order indirect calorimetry and echocardiogram ([facility], no authorization requires) today.  Dyspnea on exertion Marguetta's shortness of breath appears to be obesity related and exercise induced. She has agreed to work on weight loss and gradually increase exercise to treat her exercise induced shortness of breath. If Britaney follows our instructions and loses weight without improvement of her shortness of breath, we will plan to refer to pulmonology. We will order labs, indirect calorimetry and echocardiogram ([facility], no authorization requires) today. We will monitor this condition regularly. Winona agrees to this plan.  Diabetes II Leone has been given extensive diabetes education by myself today including ideal fasting and post-prandial blood glucose readings, individual ideal Hgb A1c goals and hypoglycemia prevention. We discussed the importance of good blood sugar control to decrease the likelihood of diabetic complications such as nephropathy, neuropathy, limb loss, blindness, coronary artery disease, and death. We discussed the importance of intensive lifestyle modification including diet, exercise and weight loss as the first line treatment for diabetes. We will check C-peptide and Hgb A1c today. She will check blood sugar 2 times daily and continue her diabetes medications as prescribed. Mikhaela will follow up at the agreed upon time.  Hypertension We discussed sodium restriction, working on healthy weight loss, and a regular exercise program as the means to achieve improved blood pressure control.  Odaliz agreed with this plan and agreed to follow up as directed. We will check CMP and order EKG and will continue to monitor her blood pressure as well as her progress with the above lifestyle modifications. Senaya will continue her medications as prescribed and will watch for signs of hypotension as she continues her lifestyle modifications.  Cardiovascular risk counseling Versa was given extended (15 minutes) coronary artery disease prevention counseling today. She is 57 y.o. female and has risk factors for heart disease including obesity, diabetes and hypertension. We discussed intensive lifestyle modifications today with an emphasis on specific weight loss instructions and strategies. Pt was also informed of the importance of increasing exercise and decreasing saturated fats to help prevent heart disease.   Depression Screen Mattisen had a strongly positive depression screening. Depression is commonly associated with obesity and often results in emotional eating behaviors. We will monitor this closely and work on CBT to help improve the non-hunger eating patterns. Referral to Psychology may be required if no improvement is seen as she continues in our clinic.  Obesity Georgena is currently in the action stage of change and her goal is to continue with weight loss efforts. I recommend Journii begin the structured treatment plan as follows:  She has agreed to follow the Category 2 plan Jaton has been instructed to eventually work up to a goal of 150 minutes of combined cardio and strengthening exercise per week for weight loss and overall health benefits. We discussed the following Behavioral Modification Strategies today: planning for success, increasing lean protein intake, decreasing simple carbohydrates , decrease eating out and work on meal planning and easy cooking plans   She was informed of the importance of frequent follow up visits to maximize her success with intensive lifestyle  modifications for her multiple health conditions. She was informed we would discuss her lab results at her next visit unless there is a critical issue that needs to be addressed sooner. Ilsa agreed to keep her next visit  at the agreed upon time to discuss these results.    OBESITY BEHAVIORAL INTERVENTION VISIT  Today's visit was # 1 out of 22.  Starting weight: 212 lbs Starting date: 03/27/18 Today's weight : 212 lbs  Today's date: 03/27/2018 Total lbs lost to date: 0 (Patients must lose 7 lbs in the first 6 months to continue with counseling)   ASK: We discussed the diagnosis of obesity with Germaine Pomfret today and Nakyia agreed to give [place] permission to discuss obesity behavioral modification therapy today.  ASSESS: Tristian has the diagnosis of obesity and her BMI today is 32.24 Marzella is in the action stage of change   ADVISE: Margarette was educated on the multiple health risks of obesity as well as the benefit of weight loss to improve her health. She was advised of the need for long term treatment and the importance of lifestyle modifications.  AGREE: Multiple dietary modification options and treatment options were discussed and  Eloyse agreed to the above obesity treatment plan.   I, Doreene Nest, am acting as transcriptionist for  Eber Jones, MD   I have reviewed the above documentation for accuracy and completeness, and I agree with the above. - Ilene Qua, MD

## 2018-03-28 ENCOUNTER — Encounter (INDEPENDENT_AMBULATORY_CARE_PROVIDER_SITE_OTHER): Payer: Self-pay | Admitting: Family Medicine

## 2018-03-28 LAB — HEMOGLOBIN A1C
ESTIMATED AVERAGE GLUCOSE: 197 mg/dL
Hgb A1c MFr Bld: 8.5 % — ABNORMAL HIGH (ref 4.8–5.6)

## 2018-03-28 LAB — COMPREHENSIVE METABOLIC PANEL
ALT: 17 IU/L (ref 0–32)
AST: 18 IU/L (ref 0–40)
Albumin/Globulin Ratio: 1.5 (ref 1.2–2.2)
Albumin: 4.1 g/dL (ref 3.5–5.5)
Alkaline Phosphatase: 69 IU/L (ref 39–117)
BUN/Creatinine Ratio: 17 (ref 9–23)
BUN: 19 mg/dL (ref 6–24)
Bilirubin Total: 0.3 mg/dL (ref 0.0–1.2)
CALCIUM: 9.6 mg/dL (ref 8.7–10.2)
CO2: 24 mmol/L (ref 20–29)
Chloride: 97 mmol/L (ref 96–106)
Creatinine, Ser: 1.14 mg/dL — ABNORMAL HIGH (ref 0.57–1.00)
GFR, EST AFRICAN AMERICAN: 61 mL/min/{1.73_m2} (ref >59)
GFR, EST NON AFRICAN AMERICAN: 53 mL/min/{1.73_m2} — AB (ref >59)
GLUCOSE: 91 mg/dL (ref 65–99)
Globulin, Total: 2.7 g/dL (ref 1.5–4.5)
Potassium: 4.3 mmol/L (ref 3.5–5.2)
Sodium: 137 mmol/L (ref 134–144)
TOTAL PROTEIN: 6.8 g/dL (ref 6.0–8.5)

## 2018-03-28 LAB — T4, FREE: FREE T4: 1.01 ng/dL (ref 0.82–1.77)

## 2018-03-28 LAB — C-PEPTIDE: C PEPTIDE: 2.3 ng/mL (ref 1.1–4.4)

## 2018-03-28 LAB — TSH: TSH: 0.902 u[IU]/mL (ref 0.450–4.500)

## 2018-03-28 LAB — FOLATE

## 2018-03-28 LAB — T3: T3 TOTAL: 87 ng/dL (ref 71–180)

## 2018-03-28 LAB — VITAMIN B12: Vitamin B-12: 735 pg/mL (ref 232–1245)

## 2018-03-28 LAB — VITAMIN D 25 HYDROXY (VIT D DEFICIENCY, FRACTURES): Vit D, 25-Hydroxy: 27.5 ng/mL — ABNORMAL LOW (ref 30.0–100.0)

## 2018-04-02 ENCOUNTER — Other Ambulatory Visit: Payer: Self-pay

## 2018-04-02 MED ORDER — GLUCOSE BLOOD VI STRP: ORAL_STRIP | 0 refills | Status: DC

## 2018-04-04 ENCOUNTER — Other Ambulatory Visit: Payer: Self-pay | Admitting: *Deleted

## 2018-04-12 ENCOUNTER — Ambulatory Visit (INDEPENDENT_AMBULATORY_CARE_PROVIDER_SITE_OTHER): Payer: 59 | Admitting: Family Medicine

## 2018-04-12 VITALS — BP 119/70 | HR 62 | Temp 97.9°F | Ht 68.0 in | Wt 206.0 lb

## 2018-04-12 DIAGNOSIS — Z794 Long term (current) use of insulin: Secondary | ICD-10-CM

## 2018-04-12 DIAGNOSIS — E559 Vitamin D deficiency, unspecified: Secondary | ICD-10-CM

## 2018-04-12 DIAGNOSIS — Z6831 Body mass index (BMI) 31.0-31.9, adult: Secondary | ICD-10-CM

## 2018-04-12 DIAGNOSIS — E119 Type 2 diabetes mellitus without complications: Secondary | ICD-10-CM

## 2018-04-12 DIAGNOSIS — Z9189 Other specified personal risk factors, not elsewhere classified: Secondary | ICD-10-CM | POA: Diagnosis not present

## 2018-04-12 DIAGNOSIS — E669 Obesity, unspecified: Secondary | ICD-10-CM

## 2018-04-12 DIAGNOSIS — I10 Essential (primary) hypertension: Secondary | ICD-10-CM | POA: Diagnosis not present

## 2018-04-12 MED ORDER — VITAMIN D (ERGOCALCIFEROL) 1.25 MG (50000 UNIT) PO CAPS: 50000.0000 [IU] | ORAL_CAPSULE | ORAL | 0 refills | Status: DC

## 2018-04-16 NOTE — Progress Notes (Signed)
Office: [phone]  /  Fax: [phone]   HPI:   Chief Complaint: OBESITY Kathleen Acevedo is here to discuss her progress with her obesity treatment plan. She is on the Category 2 plan and is following her eating plan approximately 94 % of the time. She states she is walking for 30 minutes 3 times per week. Kathleen Acevedo is occasionally switching lunch and dinner. She is using fruit for snacks. Hunger is controlled. Her weight is 206 lb (93.4 kg) today and has had a weight loss of 6 pounds over a period of 2 weeks since her last visit. She has lost 6 lbs since starting treatment with [place].  Vitamin D deficiency Kathleen Acevedo has a diagnosis of vitamin D deficiency. Kathleen Acevedo is not currently taking OTC vit D and she denies nausea, vomiting or muscle weakness.  At risk for osteopenia and osteoporosis Kathleen Acevedo is at higher risk of osteopenia and osteoporosis due to vitamin D deficiency.   Diabetes II Kathleen Acevedo has a diagnosis of diabetes type II. Kathleen Acevedo states fasting BGs range between 87 and 152 and denies any hypoglycemic episodes. She has a Hgb A1c of 8.5 (previously at 7.5 seven months ago). She decreased insulin to 25 units daily. She has been working on intensive lifestyle modifications including diet, exercise, and weight loss to help control her blood glucose levels.  Hypertension [name] is a 57 y.o. female with hypertension. Her creatinine is slightly elevated. Kathleen Acevedo denies chest pain, chest pressure or headache.. She is working weight loss to help control her blood pressure with the goal of decreasing her risk of heart attack and stroke. Kathleen Acevedo blood pressure is controlled today.  ALLERGIES: No Known Allergies  MEDICATIONS: Current Outpatient Medications on File Prior to Visit  Medication Sig Dispense Refill  . empagliflozin (JARDIANCE) 25 MG TABS tablet Take 25 mg by mouth daily. 90 tablet 1  . glucose blood (ONE TOUCH ULTRA TEST) test strip Blood sugars to be checked 2 times daily 100  each 0  . Insulin Glargine (BASAGLAR KWIKPEN) 100 UNIT/ML SOPN Inject 25 Units into the skin every morning.     [name] lisinopril-hydrochlorothiazide (PRINZIDE,ZESTORETIC) 20-12.5 MG tablet Take 1 tablet by mouth daily. 90 tablet 1  . metFORMIN (GLUCOPHAGE) 1000 MG tablet Take 1 tablet (1,000 mg total) by mouth 2 (two) times daily. 60 tablet 0  . ONETOUCH DELICA LANCETS MISC by Does not apply route 2 (two) times daily.       No current facility-administered medications on file prior to visit.     PAST MEDICAL HISTORY: Past Medical History:  Diagnosis Date  . Diabetes mellitus   . Hyperlipidemia   . Hypertension     PAST SURGICAL HISTORY: Past Surgical History:  Procedure Laterality Date  . ABDOMINAL HYSTERECTOMY    . BREAST BIOPSY Left   . OOPHORECTOMY      SOCIAL HISTORY: Social History   Tobacco Use  . Smoking status: Never Smoker  . Smokeless tobacco: Never Used  Substance Use Topics  . Alcohol use: No    Alcohol/week: 0.0 oz  . Drug use: No    FAMILY HISTORY: Family History  Problem Relation Age of Onset  . Diabetes Father   . High blood pressure Father   . Obesity Father   . Colon cancer Neg Hx   . Colon polyps Neg Hx     ROS: Review of Systems  Constitutional: Positive for weight loss.  Cardiovascular: Negative for chest pain.       Negative  for chest pressure  Gastrointestinal: Negative for nausea and vomiting.  Musculoskeletal:       Negative for muscle weakness  Neurological: Negative for headaches.  Endo/Heme/Allergies:       Negative for hypoglycemia    PHYSICAL EXAM: Blood pressure 119/70, pulse 62, temperature 97.9 F (36.6 C), temperature source Oral, height 5\' 8"  (1.727 m), weight 206 lb (93.4 kg), SpO2 99 %. Body mass index is 31.32 kg/m. Physical Exam  Constitutional: She is oriented to person, place, and time. She appears well-developed and well-nourished.  Cardiovascular: Normal rate.  Pulmonary/Chest: Effort normal.  Musculoskeletal:  Normal range of motion.  Neurological: She is oriented to person, place, and time.  Skin: Skin is warm and dry.  Psychiatric: She has a normal mood and affect. Her behavior is normal.  Vitals reviewed.   RECENT LABS AND TESTS: BMET    Component Value Date/Time   NA 137 03/27/2018 1158   K 4.3 03/27/2018 1158   CL 97 03/27/2018 1158   CO2 24 03/27/2018 1158   GLUCOSE 91 03/27/2018 1158   GLUCOSE 186 (H) 01/29/2018 0725   BUN 19 03/27/2018 1158   CREATININE 1.14 (H) 03/27/2018 1158   CREATININE 0.89 09/13/2013 1546   CALCIUM 9.6 03/27/2018 1158   GFRNONAA 53 (L) 03/27/2018 1158   GFRAA 61 03/27/2018 1158   Lab Results  Component Value Date   HGBA1C 8.5 (H) 03/27/2018   HGBA1C 7.5 09/01/2017   HGBA1C 6.2 05/04/2016   HGBA1C 9.1 (A) 09/21/2015   HGBA1C 8.1 (A) 03/23/2015   No results found for: INSULIN CBC    Component Value Date/Time   WBC 4.6 01/29/2018 0725   RBC 3.99 01/29/2018 0725   HGB 11.8 (L) 01/29/2018 0725   HCT 36.1 01/29/2018 0725   PLT 222.0 01/29/2018 0725   MCV 90.4 01/29/2018 0725   MCH 29.2 09/13/2013 1546   MCHC 32.7 01/29/2018 0725   RDW 14.3 01/29/2018 0725   LYMPHSABS 1.4 01/29/2018 0725   MONOABS 0.4 01/29/2018 0725   EOSABS 0.2 01/29/2018 0725   BASOSABS 0.0 01/29/2018 0725   Iron/TIBC/Ferritin/ %Sat    Component Value Date/Time   IRON 54 01/29/2018 0725   FERRITIN 15.1 01/29/2018 0725   IRONPCTSAT 15.0 (L) 01/29/2018 0725   Lipid Panel     Component Value Date/Time   CHOL 196 01/29/2018 0725   TRIG 118.0 01/29/2018 0725   HDL 46.90 01/29/2018 0725   CHOLHDL 4 01/29/2018 0725   VLDL 23.6 01/29/2018 0725   LDLCALC 125 (H) 01/29/2018 0725   LDLDIRECT 103.6 08/23/2012 1553   Hepatic Function Panel     Component Value Date/Time   PROT 6.8 03/27/2018 1158   ALBUMIN 4.1 03/27/2018 1158   AST 18 03/27/2018 1158   ALT 17 03/27/2018 1158   ALKPHOS 69 03/27/2018 1158   BILITOT 0.3 03/27/2018 1158   BILIDIR 0.1 12/15/2017 0912    IBILI 0.2 09/13/2013 1546      Component Value Date/Time   TSH 0.902 03/27/2018 1158   TSH 1.67 12/15/2017 0912   TSH 1.42 02/19/2016 1109   Results for Kathleen, KAWENA Acevedo (MRN [id number]) as of 04/16/2018 09:18  Ref. Range 03/27/2018 11:58  Vitamin D, 25-Hydroxy Latest Ref Range: 30.0 - 100.0 ng/mL 27.5 (L)   ASSESSMENT AND PLAN: Type 2 diabetes mellitus without complication, with long-term current use of insulin (HCC)  Vitamin D deficiency - Plan: Vitamin D, Ergocalciferol, (DRISDOL) 50000 units CAPS capsule  Essential hypertension  At risk for osteoporosis  Class  1 obesity with serious comorbidity and body mass index (BMI) of 31.0 to 31.9 in adult, unspecified obesity type  PLAN:  Vitamin D Deficiency Allyah was informed that low vitamin D levels contributes to fatigue and are associated with obesity, breast, and colon cancer. She agrees to take prescription Vit D @50 ,000 IU every week #4 with no refills and will follow up for routine testing of vitamin D, at least 2-3 times per year. She was informed of the risk of over-replacement of vitamin D and agrees to not increase her dose unless she discusses this with [place] first. Jaquesha agrees to follow up as directed.  At risk for osteopenia and osteoporosis Delcenia is at risk for osteopenia and osteoporosis due to her vitamin D deficiency. She was encouraged to take her vitamin D and follow her higher calcium diet and increase strengthening exercise to help strengthen her bones and decrease her risk of osteopenia and osteoporosis.  Diabetes II Bobby has been given extensive diabetes education by myself today including ideal fasting and post-prandial blood glucose readings, individual ideal Hgb A1c goals and hypoglycemia prevention. We discussed the importance of good blood sugar control to decrease the likelihood of diabetic complications such as nephropathy, neuropathy, limb loss, blindness, coronary artery disease, and death. We discussed the  importance of intensive lifestyle modification including diet, exercise and weight loss as the first line treatment for diabetes. Katelind agrees to continue insulin 25 units daily and Jardiance and Metformin as prescribed. Shambhavi agreed to follow up at the agreed upon time.  Hypertension We discussed sodium restriction, working on healthy weight loss, and a regular exercise program as the means to achieve improved blood pressure control. Ica agreed with this plan and agreed to follow up as directed. We will continue to monitor her blood pressure as well as her progress with the above lifestyle modifications. She will continue her Prinzide as prescribed and will watch for signs of hypotension as she continues her lifestyle modifications. We will repeat CMP in 3 months.  Obesity Kjersti is currently in the action stage of change. As such, her goal is to continue with weight loss efforts She has agreed to follow the Category 2 plan Peggyann has been instructed to work up to a goal of 150 minutes of combined cardio and strengthening exercise per week for weight loss and overall health benefits. We discussed the following Behavioral Modification Strategies today: better snacking choices, planning for success, increasing lean protein intake, increasing vegetables and work on meal planning and easy cooking plans  Akaysha has agreed to follow up with our clinic in 2 weeks. She was informed of the importance of frequent follow up visits to maximize her success with intensive lifestyle modifications for her multiple health conditions.   OBESITY BEHAVIORAL INTERVENTION VISIT  Today's visit was # 2 out of 22.  Starting weight: 212 lbs Starting date: 03/27/18 Today's weight : 206 lbs Today's date: 04/12/2018 Total lbs lost to date: 6 (Patients must lose 7 lbs in the first 6 months to continue with counseling)   ASK: We discussed the diagnosis of obesity with Germaine Pomfret today and Nevae agreed to give  [place] permission to discuss obesity behavioral modification therapy today.  ASSESS: Ezinne has the diagnosis of obesity and her BMI today is 31.33 Elodia is in the action stage of change   ADVISE: Brynnlie was educated on the multiple health risks of obesity as well as the benefit of weight loss to improve her health. She was advised  of the need for long term treatment and the importance of lifestyle modifications.  AGREE: Multiple dietary modification options and treatment options were discussed and  Charene agreed to the above obesity treatment plan.  I, Doreene Nest, am acting as transcriptionist for Eber Jones, MD  I have reviewed the above documentation for accuracy and completeness, and I agree with the above. - Ilene Qua, MD

## 2018-04-26 ENCOUNTER — Ambulatory Visit (INDEPENDENT_AMBULATORY_CARE_PROVIDER_SITE_OTHER): Payer: 59 | Admitting: Family Medicine

## 2018-04-26 VITALS — BP 108/68 | HR 65 | Temp 97.8°F | Ht 68.0 in | Wt 205.0 lb

## 2018-04-26 DIAGNOSIS — E669 Obesity, unspecified: Secondary | ICD-10-CM

## 2018-04-26 DIAGNOSIS — Z6831 Body mass index (BMI) 31.0-31.9, adult: Secondary | ICD-10-CM | POA: Diagnosis not present

## 2018-04-26 DIAGNOSIS — Z794 Long term (current) use of insulin: Secondary | ICD-10-CM

## 2018-04-26 DIAGNOSIS — E119 Type 2 diabetes mellitus without complications: Secondary | ICD-10-CM | POA: Diagnosis not present

## 2018-04-26 DIAGNOSIS — I10 Essential (primary) hypertension: Secondary | ICD-10-CM

## 2018-04-27 ENCOUNTER — Telehealth: Payer: Self-pay | Admitting: Family Medicine

## 2018-04-27 ENCOUNTER — Other Ambulatory Visit: Payer: Self-pay

## 2018-04-27 DIAGNOSIS — E119 Type 2 diabetes mellitus without complications: Secondary | ICD-10-CM

## 2018-04-27 MED ORDER — GLUCOSE BLOOD VI STRP: ORAL_STRIP | 1 refills | Status: DC

## 2018-04-27 MED ORDER — ONETOUCH ULTRA 2 W/DEVICE KIT: 1.0000 | PACK | Freq: Once | 0 refills | Status: AC

## 2018-04-27 NOTE — Telephone Encounter (Signed)
Left message for pt to return my call. When pt calls back please ask if her insurance covers the one touch ultra that was sent in on 04/02/18? If so, she will need to stay with that brand. If it did not cover onetouch brand I will be glad to send a different RX.  [place] for [facility] / triage to discuss with pt.

## 2018-04-27 NOTE — Telephone Encounter (Signed)
Copied from [place] [id number]. Topic: Quick Communication - See Telephone Encounter >> Apr 27, 2018  1:28 PM Selinda Flavin B, NT wrote: CRM for notification. See Telephone encounter for: 04/27/18. Patient calling and is requesting a prescription for a new blood sugar meter and new strips. States that she has a meter that she has had for 10 years. Would like to stay with the accu-chek 2.  [place] CB#: [phone]

## 2018-04-27 NOTE — Telephone Encounter (Signed)
Patient called back she has had the same glucometer for about 10 years and will like to get a new one. The Onetouch strips sent in earlier this month worked fine with the machine she has and she wanted the same one. A rx for Onetouch ultra sent in to the pharmacy with additional refills for the strips.

## 2018-04-30 NOTE — Progress Notes (Signed)
Office: [phone]  /  Fax: [phone]   HPI:   Chief Complaint: OBESITY Kathleen Acevedo is here to discuss her progress with her obesity treatment plan. She is on the Category 2 plan and is following her eating plan approximately 80+ % of the time. She states she is walking for 15-40 minutes 4 times per week. Kathleen Acevedo had more struggle the past few weeks at work and eating out (at [employer]) at work food is always around, mostly snacks and treats. Husband has made comments that negatively affect her view of diet.  Her weight is 205 lb (93 kg) today and has had a weight loss of 1 pound over a period of 2 weeks since her last visit. She has lost 7 lbs since starting treatment with [place].  Diabetes II Kathleen Acevedo has a diagnosis of diabetes type II. Catrena had a few episodes of feeling hypoglycemia. Fasting BGs range between 95 and 148 and post prandial range between 100 and 130 on insulin, Jardiance, and metformin. Last A1c was 8.5. She has been working on intensive lifestyle modifications including diet, exercise, and weight loss to help control her blood glucose levels.  Hypertension Kathleen Acevedo is a 57 y.o. female with hypertension. Kathleen Acevedo's blood pressure is controlled. She denies chest pain, chest pressure, or headache. She is working weight loss to help control her blood pressure with the goal of decreasing her risk of heart attack and stroke.   ALLERGIES: No Known Allergies  MEDICATIONS: Current Outpatient Medications on File Prior to Visit  Medication Sig Dispense Refill  . empagliflozin (JARDIANCE) 25 MG TABS tablet Take 25 mg by mouth daily. 90 tablet 1  . Insulin Glargine (BASAGLAR KWIKPEN) 100 UNIT/ML SOPN Inject 25 Units into the skin every morning.     [name] lisinopril-hydrochlorothiazide (PRINZIDE,ZESTORETIC) 20-12.5 MG tablet Take 1 tablet by mouth daily. 90 tablet 1  . metFORMIN (GLUCOPHAGE) 1000 MG tablet Take 1 tablet (1,000 mg total) by mouth 2 (two) times daily. 60 tablet 0  .  ONETOUCH DELICA LANCETS MISC by Does not apply route 2 (two) times daily.      . Vitamin D, Ergocalciferol, (DRISDOL) 50000 units CAPS capsule Take 1 capsule (50,000 Units total) by mouth every 7 (seven) days. 4 capsule 0   No current facility-administered medications on file prior to visit.     PAST MEDICAL HISTORY: Past Medical History:  Diagnosis Date  . Diabetes mellitus   . Hyperlipidemia   . Hypertension     PAST SURGICAL HISTORY: Past Surgical History:  Procedure Laterality Date  . ABDOMINAL HYSTERECTOMY    . BREAST BIOPSY Left   . OOPHORECTOMY      SOCIAL HISTORY: Social History   Tobacco Use  . Smoking status: Never Smoker  . Smokeless tobacco: Never Used  Substance Use Topics  . Alcohol use: No    Alcohol/week: 0.0 oz  . Drug use: No    FAMILY HISTORY: Family History  Problem Relation Age of Onset  . Diabetes Father   . High blood pressure Father   . Obesity Father   . Colon cancer Neg Hx   . Colon polyps Neg Hx     ROS: Review of Systems  Constitutional: Positive for weight loss.  Cardiovascular: Negative for chest pain.       Negative chest pressure  Neurological: Negative for headaches.  Endo/Heme/Allergies:       Positive hypoglycemia    PHYSICAL EXAM: Blood pressure 108/68, pulse 65, temperature 97.8 F (36.6 C),  height 5\' 8"  (1.727 m), weight 205 lb (93 kg), SpO2 98 %. Body mass index is 31.17 kg/m. Physical Exam  Constitutional: She is oriented to person, place, and time. She appears well-developed and well-nourished.  Cardiovascular: Normal rate.  Pulmonary/Chest: Effort normal.  Musculoskeletal: Normal range of motion.  Neurological: She is oriented to person, place, and time.  Skin: Skin is warm and dry.  Psychiatric: She has a normal mood and affect. Her behavior is normal.  Vitals reviewed.   RECENT LABS AND TESTS: BMET    Component Value Date/Time   NA 137 03/27/2018 1158   K 4.3 03/27/2018 1158   CL 97 03/27/2018 1158    CO2 24 03/27/2018 1158   GLUCOSE 91 03/27/2018 1158   GLUCOSE 186 (H) 01/29/2018 0725   BUN 19 03/27/2018 1158   CREATININE 1.14 (H) 03/27/2018 1158   CREATININE 0.89 09/13/2013 1546   CALCIUM 9.6 03/27/2018 1158   GFRNONAA 53 (L) 03/27/2018 1158   GFRAA 61 03/27/2018 1158   Lab Results  Component Value Date   HGBA1C 8.5 (H) 03/27/2018   HGBA1C 7.5 09/01/2017   HGBA1C 6.2 05/04/2016   HGBA1C 9.1 (A) 09/21/2015   HGBA1C 8.1 (A) 03/23/2015   No results found for: INSULIN CBC    Component Value Date/Time   WBC 4.6 01/29/2018 0725   RBC 3.99 01/29/2018 0725   HGB 11.8 (L) 01/29/2018 0725   HCT 36.1 01/29/2018 0725   PLT 222.0 01/29/2018 0725   MCV 90.4 01/29/2018 0725   MCH 29.2 09/13/2013 1546   MCHC 32.7 01/29/2018 0725   RDW 14.3 01/29/2018 0725   LYMPHSABS 1.4 01/29/2018 0725   MONOABS 0.4 01/29/2018 0725   EOSABS 0.2 01/29/2018 0725   BASOSABS 0.0 01/29/2018 0725   Iron/TIBC/Ferritin/ %Sat    Component Value Date/Time   IRON 54 01/29/2018 0725   FERRITIN 15.1 01/29/2018 0725   IRONPCTSAT 15.0 (L) 01/29/2018 0725   Lipid Panel     Component Value Date/Time   CHOL 196 01/29/2018 0725   TRIG 118.0 01/29/2018 0725   HDL 46.90 01/29/2018 0725   CHOLHDL 4 01/29/2018 0725   VLDL 23.6 01/29/2018 0725   LDLCALC 125 (H) 01/29/2018 0725   LDLDIRECT 103.6 08/23/2012 1553   Hepatic Function Panel     Component Value Date/Time   PROT 6.8 03/27/2018 1158   ALBUMIN 4.1 03/27/2018 1158   AST 18 03/27/2018 1158   ALT 17 03/27/2018 1158   ALKPHOS 69 03/27/2018 1158   BILITOT 0.3 03/27/2018 1158   BILIDIR 0.1 12/15/2017 0912   IBILI 0.2 09/13/2013 1546      Component Value Date/Time   TSH 0.902 03/27/2018 1158   TSH 1.67 12/15/2017 0912   TSH 1.42 02/19/2016 1109    ASSESSMENT AND PLAN: Type 2 diabetes mellitus without complication, with long-term current use of insulin (HCC)  Essential hypertension  Class 1 obesity with serious comorbidity and body mass  index (BMI) of 31.0 to 31.9 in adult, unspecified obesity type  PLAN:  Diabetes II Kathleen Acevedo has been given extensive diabetes education by myself today including ideal fasting and post-prandial blood glucose readings, individual ideal Hgb A1c goals and hypoglycemia prevention. We discussed the importance of good blood sugar control to decrease the likelihood of diabetic complications such as nephropathy, neuropathy, limb loss, blindness, coronary artery disease, and death. We discussed the importance of intensive lifestyle modification including diet, exercise and weight loss as the first line treatment for diabetes. Kariah agrees to continue her current  medications (on 20 units of insulin now), and she agrees to follow up with our clinic in 2 weeks.  Hypertension We discussed sodium restriction, working on healthy weight loss, and a regular exercise program as the means to achieve improved blood pressure control. Kathleen Acevedo agreed with this plan and agreed to follow up as directed. We will continue to monitor her blood pressure as well as her progress with the above lifestyle modifications. Kathleen Acevedo agrees to continue her current medications and will watch for signs of hypotension as she continues her lifestyle modifications. Kathleen Acevedo agrees to follow up with our clinic in 2 weeks.  We spent > than 50% of the 15 minute visit on the counseling as documented in the note.  Obesity Kathleen Acevedo is currently in the action stage of change. As such, her goal is to continue with weight loss efforts She has agreed to keep a food journal with 400-500 calories and 30+ grams of protein at supper daily and follow the Category 2 plan Kathleen Acevedo has been instructed to work up to a goal of 150 minutes of combined cardio and strengthening exercise per week for weight loss and overall health benefits. We discussed the following Behavioral Modification Strategies today: increasing lean protein intake, increasing vegetables, work on meal  planning and easy cooking plans, dealing with family or coworker sabotage, better snacking choices, planning for success, and keep a strict food journal   Kathleen Acevedo has agreed to follow up with our clinic in 2 weeks. She was informed of the importance of frequent follow up visits to maximize her success with intensive lifestyle modifications for her multiple health conditions.   OBESITY BEHAVIORAL INTERVENTION VISIT  Today's visit was # 3 out of 22.  Starting weight: 212 lbs Starting date: 03/27/18 Today's weight : 205 lbs  Today's date: 04/26/2018 Total lbs lost to date: 7 (Patients must lose 7 lbs in the first 6 months to continue with counseling)   ASK: We discussed the diagnosis of obesity with Kathleen Acevedo today and Kathleen Acevedo agreed to give [place] permission to discuss obesity behavioral modification therapy today.  ASSESS: Kathleen Acevedo has the diagnosis of obesity and her BMI today is 31.18 Kathleen Acevedo is in the action stage of change   ADVISE: Kathleen Acevedo was educated on the multiple health risks of obesity as well as the benefit of weight loss to improve her health. She was advised of the need for long term treatment and the importance of lifestyle modifications.  AGREE: Multiple dietary modification options and treatment options were discussed and  Kathleen Acevedo agreed to the above obesity treatment plan.  I, Trixie Dredge, am acting as transcriptionist for Ilene Qua, MD  I have reviewed the above documentation for accuracy and completeness, and I agree with the above. - Ilene Qua, MD

## 2018-05-17 ENCOUNTER — Ambulatory Visit (INDEPENDENT_AMBULATORY_CARE_PROVIDER_SITE_OTHER): Payer: 59 | Admitting: Family Medicine

## 2018-05-17 ENCOUNTER — Ambulatory Visit (INDEPENDENT_AMBULATORY_CARE_PROVIDER_SITE_OTHER): Payer: 59 | Admitting: Physician Assistant

## 2018-05-17 VITALS — BP 100/63 | HR 66 | Temp 97.9°F | Ht 68.0 in | Wt 202.0 lb

## 2018-05-17 DIAGNOSIS — I10 Essential (primary) hypertension: Secondary | ICD-10-CM | POA: Diagnosis not present

## 2018-05-17 DIAGNOSIS — Z9189 Other specified personal risk factors, not elsewhere classified: Secondary | ICD-10-CM

## 2018-05-17 DIAGNOSIS — E669 Obesity, unspecified: Secondary | ICD-10-CM

## 2018-05-17 DIAGNOSIS — Z6830 Body mass index (BMI) 30.0-30.9, adult: Secondary | ICD-10-CM

## 2018-05-17 DIAGNOSIS — E559 Vitamin D deficiency, unspecified: Secondary | ICD-10-CM | POA: Diagnosis not present

## 2018-05-17 MED ORDER — LISINOPRIL-HYDROCHLOROTHIAZIDE 10-12.5 MG PO TABS: 1.0000 | ORAL_TABLET | Freq: Every day | ORAL | 0 refills | Status: DC

## 2018-05-17 MED ORDER — VITAMIN D (ERGOCALCIFEROL) 1.25 MG (50000 UNIT) PO CAPS: 50000.0000 [IU] | ORAL_CAPSULE | ORAL | 0 refills | Status: DC

## 2018-05-21 NOTE — Progress Notes (Signed)
Office: [phone]  /  Fax: [phone]   HPI:   Chief Complaint: OBESITY Kathleen Acevedo is here to discuss her progress with her obesity treatment plan. She is on the keep a food journal with 400-500 calories and 30+ grams of protein at supper daily and follow the Category 2 plan and is following her eating plan approximately 85 % of the time. She states she is walking for 45 minutes 7 times per week. Kathleen Acevedo has been off the meal plan and is not getting the recommended protein. She is ready to get back on track and continues with weight loss.  Her weight is 202 lb (91.6 kg) today and has gained 3 pounds since her last visit. She has lost 10 lbs since starting treatment with [place].  Hypertension Kathleen Acevedo is a 57 y.o. female with hypertension. Kathleen Acevedo's blood pressure is low and she denies chest pain, dizziness, or shortness of breath. She is to keep a blood pressure log and bring in for review next visit. She working weight loss to help control her blood pressure with the goal of decreasing her risk of heart attack and stroke. Kathleen Acevedo's blood pressure is not currently controlled.  At risk for cardiovascular disease Kathleen Acevedo is at a higher than average risk for cardiovascular disease due to obesity and hypertension. She currently denies any chest pain.  Vitamin D Deficiency Kathleen Acevedo has a diagnosis of vitamin D deficiency. She is currently taking prescription Vit D and denies nausea, vomiting or muscle weakness.  ALLERGIES: No Known Allergies  MEDICATIONS: Current Outpatient Medications on File Prior to Visit  Medication Sig Dispense Refill  . empagliflozin (JARDIANCE) 25 MG TABS tablet Take 25 mg by mouth daily. 90 tablet 1  . glucose blood (ONE TOUCH ULTRA TEST) test strip Blood sugars to be checked 2 times daily 100 each 1  . Insulin Glargine (BASAGLAR KWIKPEN) 100 UNIT/ML SOPN Inject 25 Units into the skin every morning.     . metFORMIN (GLUCOPHAGE) 1000 MG tablet Take 1 tablet (1,000 mg  total) by mouth 2 (two) times daily. 60 tablet 0  . ONETOUCH DELICA LANCETS MISC by Does not apply route 2 (two) times daily.       No current facility-administered medications on file prior to visit.     PAST MEDICAL HISTORY: Past Medical History:  Diagnosis Date  . Diabetes mellitus   . Hyperlipidemia   . Hypertension     PAST SURGICAL HISTORY: Past Surgical History:  Procedure Laterality Date  . ABDOMINAL HYSTERECTOMY    . BREAST BIOPSY Left   . OOPHORECTOMY      SOCIAL HISTORY: Social History   Tobacco Use  . Smoking status: Never Smoker  . Smokeless tobacco: Never Used  Substance Use Topics  . Alcohol use: No    Alcohol/week: 0.0 oz  . Drug use: No    FAMILY HISTORY: Family History  Problem Relation Age of Onset  . Diabetes Father   . High blood pressure Father   . Obesity Father   . Colon cancer Neg Hx   . Colon polyps Neg Hx     ROS: Review of Systems  Constitutional: Negative for weight loss.  Respiratory: Negative for shortness of breath.   Cardiovascular: Negative for chest pain.  Gastrointestinal: Negative for nausea and vomiting.  Musculoskeletal:       Negative muscle weakness  Neurological: Negative for dizziness.    PHYSICAL EXAM: Blood pressure 100/63, pulse 66, temperature 97.9 F (36.6 C), temperature source Oral, height  5\' 8"  (1.727 m), weight 202 lb (91.6 kg), SpO2 100 %. Body mass index is 30.71 kg/m. Physical Exam  Constitutional: She is oriented to person, place, and time. She appears well-developed and well-nourished.  Cardiovascular: Normal rate.  Pulmonary/Chest: Effort normal.  Musculoskeletal: Normal range of motion.  Neurological: She is oriented to person, place, and time.  Skin: Skin is warm and dry.  Psychiatric: She has a normal mood and affect. Her behavior is normal.  Vitals reviewed.   RECENT LABS AND TESTS: BMET    Component Value Date/Time   NA 137 03/27/2018 1158   K 4.3 03/27/2018 1158   CL 97  03/27/2018 1158   CO2 24 03/27/2018 1158   GLUCOSE 91 03/27/2018 1158   GLUCOSE 186 (H) 01/29/2018 0725   BUN 19 03/27/2018 1158   CREATININE 1.14 (H) 03/27/2018 1158   CREATININE 0.89 09/13/2013 1546   CALCIUM 9.6 03/27/2018 1158   GFRNONAA 53 (L) 03/27/2018 1158   GFRAA 61 03/27/2018 1158   Lab Results  Component Value Date   HGBA1C 8.5 (H) 03/27/2018   HGBA1C 7.5 09/01/2017   HGBA1C 6.2 05/04/2016   HGBA1C 9.1 (A) 09/21/2015   HGBA1C 8.1 (A) 03/23/2015   No results found for: INSULIN CBC    Component Value Date/Time   WBC 4.6 01/29/2018 0725   RBC 3.99 01/29/2018 0725   HGB 11.8 (L) 01/29/2018 0725   HCT 36.1 01/29/2018 0725   PLT 222.0 01/29/2018 0725   MCV 90.4 01/29/2018 0725   MCH 29.2 09/13/2013 1546   MCHC 32.7 01/29/2018 0725   RDW 14.3 01/29/2018 0725   LYMPHSABS 1.4 01/29/2018 0725   MONOABS 0.4 01/29/2018 0725   EOSABS 0.2 01/29/2018 0725   BASOSABS 0.0 01/29/2018 0725   Iron/TIBC/Ferritin/ %Sat    Component Value Date/Time   IRON 54 01/29/2018 0725   FERRITIN 15.1 01/29/2018 0725   IRONPCTSAT 15.0 (L) 01/29/2018 0725   Lipid Panel     Component Value Date/Time   CHOL 196 01/29/2018 0725   TRIG 118.0 01/29/2018 0725   HDL 46.90 01/29/2018 0725   CHOLHDL 4 01/29/2018 0725   VLDL 23.6 01/29/2018 0725   LDLCALC 125 (H) 01/29/2018 0725   LDLDIRECT 103.6 08/23/2012 1553   Hepatic Function Panel     Component Value Date/Time   PROT 6.8 03/27/2018 1158   ALBUMIN 4.1 03/27/2018 1158   AST 18 03/27/2018 1158   ALT 17 03/27/2018 1158   ALKPHOS 69 03/27/2018 1158   BILITOT 0.3 03/27/2018 1158   BILIDIR 0.1 12/15/2017 0912   IBILI 0.2 09/13/2013 1546      Component Value Date/Time   TSH 0.902 03/27/2018 1158   TSH 1.67 12/15/2017 0912   TSH 1.42 02/19/2016 1109  Results for Mihok, Kathleen GAILLARD (MRN [id number]) as of 05/21/2018 09:38  Ref. Range 03/27/2018 11:58  Vitamin D, 25-Hydroxy Latest Ref Range: 30.0 - 100.0 ng/mL 27.5 (L)    ASSESSMENT AND  PLAN: Essential hypertension  Vitamin D deficiency - Plan: Vitamin D, Ergocalciferol, (DRISDOL) 50000 units CAPS capsule  At risk for heart disease  Class 1 obesity with serious comorbidity and body mass index (BMI) of 30.0 to 30.9 in adult, unspecified obesity type  PLAN:  Hypertension We discussed sodium restriction, working on healthy weight loss, and a regular exercise program as the means to achieve improved blood pressure control. Kathleen Acevedo agreed with this plan and agreed to follow up as directed. We will continue to monitor her blood pressure as well as her  progress with the above lifestyle modifications. Kathleen Acevedo agrees to continue taking lisinopril-hydrochlorothiazide 10-12.5 mg qd #30 and we will refill for 1 month. She will watch for signs of hypotension as she continues her lifestyle modifications. Kathleen Acevedo agrees to follow up with our clinic in 2 weeks.  Cardiovascular risk counselling Kathleen Acevedo was given extended (15 minutes) coronary artery disease prevention counseling today. She is 57 y.o. female and has risk factors for heart disease including obesity and hypertension. We discussed intensive lifestyle modifications today with an emphasis on specific weight loss instructions and strategies. Pt was also informed of the importance of increasing exercise and decreasing saturated fats to help prevent heart disease.  Vitamin D Deficiency Kathleen Acevedo was informed that low vitamin D levels contributes to fatigue and are associated with obesity, breast, and colon cancer. Kathleen Acevedo agrees to continue taking prescription Vit D @50 ,000 IU every week #4 and we will refill for 1 month. She will follow up for routine testing of vitamin D, at least 2-3 times per year. She was informed of the risk of over-replacement of vitamin D and agrees to not increase her dose unless she discusses this with [place] first. Kathleen Acevedo agrees to follow up with our clinic in 2 weeks.  Obesity Kathleen Acevedo is currently in the action stage of  change. As such, her goal is to continue with weight loss efforts She has agreed to keep a food journal with 400-500 calories and 30+ grams of protein at lunch daily and follow the Category 2 plan Kathleen Acevedo has been instructed to work up to a goal of 150 minutes of combined cardio and strengthening exercise per week for weight loss and overall health benefits. We discussed the following Behavioral Modification Strategies today: increasing lean protein intake, work on meal planning and easy cooking plans, and keep a strict food journal   Kathleen Acevedo has agreed to follow up with our clinic in 2 weeks. She was informed of the importance of frequent follow up visits to maximize her success with intensive lifestyle modifications for her multiple health conditions.   OBESITY BEHAVIORAL INTERVENTION VISIT  Today's visit was # 4 out of 22.  Starting weight: 212 lbs Starting date: 03/27/18 Today's weight : 202 lbs Today's date: 05/17/2018 Total lbs lost to date: 10 (Patients must lose 7 lbs in the first 6 months to continue with counseling)   ASK: We discussed the diagnosis of obesity with Kathleen Acevedo today and Kathleen Acevedo agreed to give [place] permission to discuss obesity behavioral modification therapy today.  ASSESS: Kathleen Acevedo has the diagnosis of obesity and her BMI today is 30.72 Kathleen Acevedo is in the action stage of change   ADVISE: Kathleen Acevedo was educated on the multiple health risks of obesity as well as the benefit of weight loss to improve her health. She was advised of the need for long term treatment and the importance of lifestyle modifications.  AGREE: Multiple dietary modification options and treatment options were discussed and  Kathleen Acevedo agreed to the above obesity treatment plan.   Wilhemena Durie, am acting as transcriptionist for Lacy Duverney, PA-C I, Lacy Duverney [facility], have reviewed this note and agree with its content

## 2018-06-04 ENCOUNTER — Ambulatory Visit (INDEPENDENT_AMBULATORY_CARE_PROVIDER_SITE_OTHER): Payer: 59 | Admitting: Family Medicine

## 2018-06-04 ENCOUNTER — Encounter (INDEPENDENT_AMBULATORY_CARE_PROVIDER_SITE_OTHER): Payer: Self-pay

## 2018-06-12 ENCOUNTER — Ambulatory Visit (INDEPENDENT_AMBULATORY_CARE_PROVIDER_SITE_OTHER): Payer: 59 | Admitting: Physician Assistant

## 2018-06-12 VITALS — BP 109/67 | HR 68 | Temp 98.3°F | Ht 68.0 in | Wt 205.0 lb

## 2018-06-12 DIAGNOSIS — Z9189 Other specified personal risk factors, not elsewhere classified: Secondary | ICD-10-CM

## 2018-06-12 DIAGNOSIS — E669 Obesity, unspecified: Secondary | ICD-10-CM

## 2018-06-12 DIAGNOSIS — E559 Vitamin D deficiency, unspecified: Secondary | ICD-10-CM | POA: Diagnosis not present

## 2018-06-12 DIAGNOSIS — I10 Essential (primary) hypertension: Secondary | ICD-10-CM | POA: Diagnosis not present

## 2018-06-12 DIAGNOSIS — Z6831 Body mass index (BMI) 31.0-31.9, adult: Secondary | ICD-10-CM

## 2018-06-12 MED ORDER — LISINOPRIL-HYDROCHLOROTHIAZIDE 10-12.5 MG PO TABS: 1.0000 | ORAL_TABLET | Freq: Every day | ORAL | 0 refills | Status: DC

## 2018-06-12 MED ORDER — VITAMIN D (ERGOCALCIFEROL) 1.25 MG (50000 UNIT) PO CAPS: 50000.0000 [IU] | ORAL_CAPSULE | ORAL | 0 refills | Status: DC

## 2018-06-12 NOTE — Progress Notes (Signed)
Office: [phone]  /  Fax: [phone]   HPI:   Chief Complaint: OBESITY Kathleen Acevedo is here to discuss her progress with her obesity treatment plan. She is on the keep a food journal with 400 to 500 calories and 30+ grams of protein at lunch daily and the Category 2 plan and is following her eating plan approximately 60 % of the time. She states she is walking 30 minutes 4 times per week. Kathleen Acevedo reports that she has been on vacation since her last visit. She states that she practiced mindful eating, however she indulged in foods. She is ready to get back on track. Her weight is 205 lb (93 kg) today and has had a weight gain of 3 pounds over a period of 3 to 4 weeks since her last visit. She has lost 7 lbs since starting treatment with [place].  Vitamin D deficiency Kathleen Acevedo has a diagnosis of vitamin D deficiency. She is currently taking prescription vit D and denies nausea, vomiting or muscle weakness.  At risk for osteopenia and osteoporosis Kathleen Acevedo is at higher risk of osteopenia and osteoporosis due to vitamin D deficiency.   Hypertension Kathleen Acevedo is a 57 y.o. female with hypertension. Kathleen Acevedo denies chest pain or shortness of breath on exertion. She is working weight loss to help control her blood pressure with the goal of decreasing her risk of heart attack and stroke. Kathleen Acevedo blood pressure is well controlled today.  ALLERGIES: No Known Allergies  MEDICATIONS: Current Outpatient Medications on File Prior to Visit  Medication Sig Dispense Refill  . empagliflozin (JARDIANCE) 25 MG TABS tablet Take 25 mg by mouth daily. 90 tablet 1  . glucose blood (ONE TOUCH ULTRA TEST) test strip Blood sugars to be checked 2 times daily 100 each 1  . Insulin Glargine (BASAGLAR KWIKPEN) 100 UNIT/ML SOPN Inject 25 Units into the skin every morning.     . metFORMIN (GLUCOPHAGE) 1000 MG tablet Take 1 tablet (1,000 mg total) by mouth 2 (two) times daily. 60 tablet 0  . ONETOUCH DELICA LANCETS  MISC by Does not apply route 2 (two) times daily.       No current facility-administered medications on file prior to visit.     PAST MEDICAL HISTORY: Past Medical History:  Diagnosis Date  . Diabetes mellitus   . Hyperlipidemia   . Hypertension     PAST SURGICAL HISTORY: Past Surgical History:  Procedure Laterality Date  . ABDOMINAL HYSTERECTOMY    . BREAST BIOPSY Left   . OOPHORECTOMY      SOCIAL HISTORY: Social History   Tobacco Use  . Smoking status: Never Smoker  . Smokeless tobacco: Never Used  Substance Use Topics  . Alcohol use: No    Alcohol/week: 0.0 standard drinks  . Drug use: No    FAMILY HISTORY: Family History  Problem Relation Age of Onset  . Diabetes Father   . High blood pressure Father   . Obesity Father   . Colon cancer Neg Hx   . Colon polyps Neg Hx     ROS: Review of Systems  Constitutional: Negative for weight loss.  Respiratory: Negative for shortness of breath (on exertion).   Cardiovascular: Negative for chest pain.  Gastrointestinal: Negative for nausea and vomiting.  Musculoskeletal:       Negative for muscle weakness    PHYSICAL EXAM: Blood pressure 109/67, pulse 68, temperature 98.3 F (36.8 C), temperature source Oral, height 5\' 8"  (1.727 m), weight 205 lb (93  kg), SpO2 99 %. Body mass index is 31.17 kg/m. Physical Exam  Constitutional: She is oriented to person, place, and time. She appears well-developed and well-nourished.  Cardiovascular: Normal rate.  Pulmonary/Chest: Effort normal.  Musculoskeletal: Normal range of motion.  Neurological: She is oriented to person, place, and time.  Skin: Skin is warm and dry.  Psychiatric: She has a normal mood and affect. Her behavior is normal.  Vitals reviewed.   RECENT LABS AND TESTS: BMET    Component Value Date/Time   NA 137 03/27/2018 1158   K 4.3 03/27/2018 1158   CL 97 03/27/2018 1158   CO2 24 03/27/2018 1158   GLUCOSE 91 03/27/2018 1158   GLUCOSE 186 (H)  01/29/2018 0725   BUN 19 03/27/2018 1158   CREATININE 1.14 (H) 03/27/2018 1158   CREATININE 0.89 09/13/2013 1546   CALCIUM 9.6 03/27/2018 1158   GFRNONAA 53 (L) 03/27/2018 1158   GFRAA 61 03/27/2018 1158   Lab Results  Component Value Date   HGBA1C 8.5 (H) 03/27/2018   HGBA1C 7.5 09/01/2017   HGBA1C 6.2 05/04/2016   HGBA1C 9.1 (A) 09/21/2015   HGBA1C 8.1 (A) 03/23/2015   No results found for: INSULIN CBC    Component Value Date/Time   WBC 4.6 01/29/2018 0725   RBC 3.99 01/29/2018 0725   HGB 11.8 (L) 01/29/2018 0725   HCT 36.1 01/29/2018 0725   PLT 222.0 01/29/2018 0725   MCV 90.4 01/29/2018 0725   MCH 29.2 09/13/2013 1546   MCHC 32.7 01/29/2018 0725   RDW 14.3 01/29/2018 0725   LYMPHSABS 1.4 01/29/2018 0725   MONOABS 0.4 01/29/2018 0725   EOSABS 0.2 01/29/2018 0725   BASOSABS 0.0 01/29/2018 0725   Iron/TIBC/Ferritin/ %Sat    Component Value Date/Time   IRON 54 01/29/2018 0725   FERRITIN 15.1 01/29/2018 0725   IRONPCTSAT 15.0 (L) 01/29/2018 0725   Lipid Panel     Component Value Date/Time   CHOL 196 01/29/2018 0725   TRIG 118.0 01/29/2018 0725   HDL 46.90 01/29/2018 0725   CHOLHDL 4 01/29/2018 0725   VLDL 23.6 01/29/2018 0725   LDLCALC 125 (H) 01/29/2018 0725   LDLDIRECT 103.6 08/23/2012 1553   Hepatic Function Panel     Component Value Date/Time   PROT 6.8 03/27/2018 1158   ALBUMIN 4.1 03/27/2018 1158   AST 18 03/27/2018 1158   ALT 17 03/27/2018 1158   ALKPHOS 69 03/27/2018 1158   BILITOT 0.3 03/27/2018 1158   BILIDIR 0.1 12/15/2017 0912   IBILI 0.2 09/13/2013 1546      Component Value Date/Time   TSH 0.902 03/27/2018 1158   TSH 1.67 12/15/2017 0912   TSH 1.42 02/19/2016 1109   Results for Kathleen Acevedo, Kathleen Acevedo (MRN [id number]) as of 06/12/2018 16:21  Ref. Range 03/27/2018 11:58  Vitamin D, 25-Hydroxy Latest Ref Range: 30.0 - 100.0 ng/mL 27.5 (L)   ASSESSMENT AND PLAN: Vitamin D deficiency - Plan: Vitamin D, Ergocalciferol, (DRISDOL) 50000 units CAPS  capsule  Essential hypertension - Plan: lisinopril-hydrochlorothiazide (PRINZIDE,ZESTORETIC) 10-12.5 MG tablet  At risk for osteoporosis  Class 1 obesity with serious comorbidity and body mass index (BMI) of 31.0 to 31.9 in adult, unspecified obesity type  PLAN:  Vitamin D Deficiency Kathleen Acevedo was informed that low vitamin D levels contributes to fatigue and are associated with obesity, breast, and colon cancer. She agrees to continue to take prescription Vit D @50 ,000 IU every week #4 with no refills and will follow up for routine testing of vitamin D,  at least 2-3 times per year. She was informed of the risk of over-replacement of vitamin D and agrees to not increase her dose unless she discusses this with [place] first. Kathleen Acevedo agrees to follow up as directed.  At risk for osteopenia and osteoporosis Kathleen Acevedo is at risk for osteopenia and osteoporosis due to her vitamin D deficiency. She was encouraged to take her vitamin D and follow her higher calcium diet and increase strengthening exercise to help strengthen her bones and decrease her risk of osteopenia and osteoporosis.  Hypertension We discussed sodium restriction, working on healthy weight loss, and a regular exercise program as the means to achieve improved blood pressure control. Kathleen Acevedo agreed with this plan and agreed to follow up as directed. We will continue to monitor her blood pressure as well as her progress with the above lifestyle modifications. She agrees to continue lisinopril-HCTZ 10-12.5 mg daily #30 with no refills and will watch for signs of hypotension as she continues her lifestyle modifications.  Obesity Kathleen Acevedo is currently in the action stage of change. As such, her goal is to continue with weight loss efforts She has agreed to keep a food journal with 1100 to 1200 calories and 85+ grams of protein daily Kathleen Acevedo has been instructed to work up to a goal of 150 minutes of combined cardio and strengthening exercise per week for  weight loss and overall health benefits. We discussed the following Behavioral Modification Strategies today: planning for success and work on meal planning and easy cooking plans  Kathleen Acevedo has agreed to follow up with our clinic in 2 to 3 weeks. She was informed of the importance of frequent follow up visits to maximize her success with intensive lifestyle modifications for her multiple health conditions.   OBESITY BEHAVIORAL INTERVENTION VISIT  Today's visit was # 5 out of 22.  Starting weight: 212 lbs Starting date: 03/27/18 Today's weight : 205 lbs  Today's date: 06/12/2018 Total lbs lost to date: 7    ASK: We discussed the diagnosis of obesity with Kathleen Acevedo today and Kathleen Acevedo agreed to give [place] permission to discuss obesity behavioral modification therapy today.  ASSESS: Kathleen Acevedo has the diagnosis of obesity and her BMI today is 31.18 Kathleen Acevedo is in the action stage of change   ADVISE: Kathleen Acevedo was educated on the multiple health risks of obesity as well as the benefit of weight loss to improve her health. She was advised of the need for long term treatment and the importance of lifestyle modifications.  AGREE: Multiple dietary modification options and treatment options were discussed and  Kathleen Acevedo agreed to the above obesity treatment plan.  Corey Skains, am acting as transcriptionist for Abby Potash, PA-C I, Abby Potash, PA-C have reviewed above note and agree with its content

## 2018-07-02 ENCOUNTER — Other Ambulatory Visit (INDEPENDENT_AMBULATORY_CARE_PROVIDER_SITE_OTHER): Payer: Self-pay | Admitting: Physician Assistant

## 2018-07-02 DIAGNOSIS — E559 Vitamin D deficiency, unspecified: Secondary | ICD-10-CM

## 2018-07-04 ENCOUNTER — Encounter (INDEPENDENT_AMBULATORY_CARE_PROVIDER_SITE_OTHER): Payer: Self-pay | Admitting: Physician Assistant

## 2018-07-04 ENCOUNTER — Ambulatory Visit (INDEPENDENT_AMBULATORY_CARE_PROVIDER_SITE_OTHER): Payer: 59 | Admitting: Physician Assistant

## 2018-07-04 VITALS — BP 123/67 | HR 57 | Temp 97.9°F | Ht 68.0 in | Wt 202.0 lb

## 2018-07-04 DIAGNOSIS — I10 Essential (primary) hypertension: Secondary | ICD-10-CM

## 2018-07-04 DIAGNOSIS — E559 Vitamin D deficiency, unspecified: Secondary | ICD-10-CM | POA: Diagnosis not present

## 2018-07-04 DIAGNOSIS — Z9189 Other specified personal risk factors, not elsewhere classified: Secondary | ICD-10-CM

## 2018-07-04 DIAGNOSIS — E669 Obesity, unspecified: Secondary | ICD-10-CM

## 2018-07-04 DIAGNOSIS — Z6830 Body mass index (BMI) 30.0-30.9, adult: Secondary | ICD-10-CM

## 2018-07-04 MED ORDER — VITAMIN D (ERGOCALCIFEROL) 1.25 MG (50000 UNIT) PO CAPS: 50000.0000 [IU] | ORAL_CAPSULE | ORAL | 0 refills | Status: DC

## 2018-07-04 MED ORDER — LISINOPRIL-HYDROCHLOROTHIAZIDE 10-12.5 MG PO TABS: 1.0000 | ORAL_TABLET | Freq: Every day | ORAL | 0 refills | Status: DC

## 2018-07-05 NOTE — Progress Notes (Signed)
Office: [phone]  /  Fax: [phone]   HPI:   Chief Complaint: OBESITY Kathleen Acevedo is here to discuss her progress with her obesity treatment plan. She is on the keep a food journal with 1100-1200 calories and 85+ grams of protein daily and is following her eating plan approximately 85 % of the time. She states she is walking for 60 minutes 5 times per week. Kathleen Acevedo did very well with weight loss. She reports making smarter choices and portions controlling her meals, especially when eating out.  Her weight is 202 lb (91.6 kg) today and has had a weight loss of 3 pounds over a period of 3 weeks since her last visit. She has lost 10 lbs since starting treatment with [place].  Hypertension ANNETTA DEISS is a 57 y.o. female with hypertension. Kathleen Acevedo's blood pressure is controlled and she denies chest pain. She is working weight loss to help control her blood pressure with the goal of decreasing her risk of heart attack and stroke.   Vitamin D Deficiency Kathleen Acevedo has a diagnosis of vitamin D deficiency. She is on prescription Vit D and denies nausea, vomiting or muscle weakness.  At risk for osteopenia and osteoporosis Kathleen Acevedo is at higher risk of osteopenia and osteoporosis due to vitamin D deficiency.   ALLERGIES: No Known Allergies  MEDICATIONS: Current Outpatient Medications on File Prior to Visit  Medication Sig Dispense Refill  . empagliflozin (JARDIANCE) 25 MG TABS tablet Take 25 mg by mouth daily. 90 tablet 1  . glucose blood (ONE TOUCH ULTRA TEST) test strip Blood sugars to be checked 2 times daily 100 each 1  . Insulin Glargine (BASAGLAR KWIKPEN) 100 UNIT/ML SOPN Inject 25 Units into the skin every morning.     . metFORMIN (GLUCOPHAGE) 1000 MG tablet Take 1 tablet (1,000 mg total) by mouth 2 (two) times daily. 60 tablet 0  . ONETOUCH DELICA LANCETS MISC by Does not apply route 2 (two) times daily.       No current facility-administered medications on file prior to visit.     PAST  MEDICAL HISTORY: Past Medical History:  Diagnosis Date  . Diabetes mellitus   . Hyperlipidemia   . Hypertension     PAST SURGICAL HISTORY: Past Surgical History:  Procedure Laterality Date  . ABDOMINAL HYSTERECTOMY    . BREAST BIOPSY Left   . OOPHORECTOMY      SOCIAL HISTORY: Social History   Tobacco Use  . Smoking status: Never Smoker  . Smokeless tobacco: Never Used  Substance Use Topics  . Alcohol use: No    Alcohol/week: 0.0 standard drinks  . Drug use: No    FAMILY HISTORY: Family History  Problem Relation Age of Onset  . Diabetes Father   . High blood pressure Father   . Obesity Father   . Colon cancer Neg Hx   . Colon polyps Neg Hx     ROS: Review of Systems  Constitutional: Positive for weight loss.  Cardiovascular: Negative for chest pain.  Gastrointestinal: Negative for nausea and vomiting.  Musculoskeletal:       Negative muscle weakness    PHYSICAL EXAM: Blood pressure 123/67, pulse (!) 57, temperature 97.9 F (36.6 C), temperature source Oral, height 5\' 8"  (1.727 m), weight 202 lb (91.6 kg), SpO2 98 %. Body mass index is 30.71 kg/m. Physical Exam  Constitutional: She is oriented to person, place, and time. She appears well-developed and well-nourished.  Cardiovascular: Normal rate.  Pulmonary/Chest: Effort normal.  Musculoskeletal: Normal range  of motion.  Neurological: She is oriented to person, place, and time.  Skin: Skin is warm and dry.  Psychiatric: She has a normal mood and affect. Her behavior is normal.  Vitals reviewed.   RECENT LABS AND TESTS: BMET    Component Value Date/Time   NA 137 03/27/2018 1158   K 4.3 03/27/2018 1158   CL 97 03/27/2018 1158   CO2 24 03/27/2018 1158   GLUCOSE 91 03/27/2018 1158   GLUCOSE 186 (H) 01/29/2018 0725   BUN 19 03/27/2018 1158   CREATININE 1.14 (H) 03/27/2018 1158   CREATININE 0.89 09/13/2013 1546   CALCIUM 9.6 03/27/2018 1158   GFRNONAA 53 (L) 03/27/2018 1158   GFRAA 61 03/27/2018  1158   Lab Results  Component Value Date   HGBA1C 8.5 (H) 03/27/2018   HGBA1C 7.5 09/01/2017   HGBA1C 6.2 05/04/2016   HGBA1C 9.1 (A) 09/21/2015   HGBA1C 8.1 (A) 03/23/2015   No results found for: INSULIN CBC    Component Value Date/Time   WBC 4.6 01/29/2018 0725   RBC 3.99 01/29/2018 0725   HGB 11.8 (L) 01/29/2018 0725   HCT 36.1 01/29/2018 0725   PLT 222.0 01/29/2018 0725   MCV 90.4 01/29/2018 0725   MCH 29.2 09/13/2013 1546   MCHC 32.7 01/29/2018 0725   RDW 14.3 01/29/2018 0725   LYMPHSABS 1.4 01/29/2018 0725   MONOABS 0.4 01/29/2018 0725   EOSABS 0.2 01/29/2018 0725   BASOSABS 0.0 01/29/2018 0725   Iron/TIBC/Ferritin/ %Sat    Component Value Date/Time   IRON 54 01/29/2018 0725   FERRITIN 15.1 01/29/2018 0725   IRONPCTSAT 15.0 (L) 01/29/2018 0725   Lipid Panel     Component Value Date/Time   CHOL 196 01/29/2018 0725   TRIG 118.0 01/29/2018 0725   HDL 46.90 01/29/2018 0725   CHOLHDL 4 01/29/2018 0725   VLDL 23.6 01/29/2018 0725   LDLCALC 125 (H) 01/29/2018 0725   LDLDIRECT 103.6 08/23/2012 1553   Hepatic Function Panel     Component Value Date/Time   PROT 6.8 03/27/2018 1158   ALBUMIN 4.1 03/27/2018 1158   AST 18 03/27/2018 1158   ALT 17 03/27/2018 1158   ALKPHOS 69 03/27/2018 1158   BILITOT 0.3 03/27/2018 1158   BILIDIR 0.1 12/15/2017 0912   IBILI 0.2 09/13/2013 1546      Component Value Date/Time   TSH 0.902 03/27/2018 1158   TSH 1.67 12/15/2017 0912   TSH 1.42 02/19/2016 1109  Results for Aronov, LENYA STERNE (MRN [id number]) as of 07/05/2018 08:52  Ref. Range 03/27/2018 11:58  Vitamin D, 25-Hydroxy Latest Ref Range: 30.0 - 100.0 ng/mL 27.5 (L)    ASSESSMENT AND PLAN: Essential hypertension - Plan: lisinopril-hydrochlorothiazide (PRINZIDE,ZESTORETIC) 10-12.5 MG tablet  Vitamin D deficiency - Plan: Vitamin D, Ergocalciferol, (DRISDOL) 50000 units CAPS capsule  At risk for osteoporosis  Class 1 obesity with serious comorbidity and body mass index  (BMI) of 30.0 to 30.9 in adult, unspecified obesity type  PLAN:  Hypertension We discussed sodium restriction, working on healthy weight loss, and a regular exercise program as the means to achieve improved blood pressure control. Kathleen Acevedo agreed with this plan and agreed to follow up as directed. We will continue to monitor her blood pressure as well as her progress with the above lifestyle modifications. Shateka agrees to continue taking lisinopril-hydrochlorothiazide 10-12.5 mg qd #30 and we will refill for 1 month. She will watch for signs of hypotension as she continues her lifestyle modifications. Jesus agrees to follow up  with our clinic in 2 to 3 weeks.  Vitamin D Deficiency Kathleen Acevedo was informed that low vitamin D levels contributes to fatigue and are associated with obesity, breast, and colon cancer. Kathleen Acevedo agrees to continue taking prescription Vit D @50 ,000 IU every week #4 and we will refill for 1 month. She will follow up for routine testing of vitamin D, at least 2-3 times per year. She was informed of the risk of over-replacement of vitamin D and agrees to not increase her dose unless she discusses this with [place] first. Kathleen Acevedo agrees to follow up with our clinic in 2 to 3 weeks.  At risk for osteopenia and osteoporosis Kathleen Acevedo was given extended  (15 minutes) osteoporosis prevention counseling today. Kathleen Acevedo is at risk for osteopenia and osteoporsis due to her vitamin D deficiency. She was encouraged to take her vitamin D and follow her higher calcium diet and increase strengthening exercise to help strengthen her bones and decrease her risk of osteopenia and osteoporosis.  Obesity Kathleen Acevedo is currently in the action stage of change. As such, her goal is to continue with weight loss efforts She has agreed to keep a food journal with 1100-1200 calories and 85+ grams of protein daily Kathleen Acevedo has been instructed to work up to a goal of 150 minutes of combined cardio and strengthening exercise per  week for weight loss and overall health benefits. We discussed the following Behavioral Modification Strategies today: work on meal planning and easy cooking plans and planning for success   Kathleen Acevedo has agreed to follow up with our clinic in 2 to 3 weeks. She was informed of the importance of frequent follow up visits to maximize her success with intensive lifestyle modifications for her multiple health conditions.   OBESITY BEHAVIORAL INTERVENTION VISIT  Today's visit was # 6.  Starting weight: 212 lbs Starting date: 03/27/18 Today's weight : 202 lbs  Today's date: 07/04/2018 Total lbs lost to date: 10    ASK: We discussed the diagnosis of obesity with Kathleen Acevedo today and Kathleen Acevedo agreed to give [place] permission to discuss obesity behavioral modification therapy today.  ASSESS: Kathleen Acevedo has the diagnosis of obesity and her BMI today is 30.72 Kathleen Acevedo is in the action stage of change   ADVISE: Kathleen Acevedo was educated on the multiple health risks of obesity as well as the benefit of weight loss to improve her health. She was advised of the need for long term treatment and the importance of lifestyle modifications.  AGREE: Multiple dietary modification options and treatment options were discussed and  Kathleen Acevedo agreed to the above obesity treatment plan.  Wilhemena Durie, am acting as transcriptionist for Abby Potash, PA-C I, Abby Potash, PA-C have reviewed above note and agree with its content

## 2018-07-23 ENCOUNTER — Encounter (INDEPENDENT_AMBULATORY_CARE_PROVIDER_SITE_OTHER): Payer: Self-pay | Admitting: Physician Assistant

## 2018-07-23 ENCOUNTER — Ambulatory Visit (INDEPENDENT_AMBULATORY_CARE_PROVIDER_SITE_OTHER): Payer: 59 | Admitting: Physician Assistant

## 2018-07-23 VITALS — BP 120/73 | HR 63 | Temp 98.3°F | Ht 68.0 in | Wt 201.0 lb

## 2018-07-23 DIAGNOSIS — Z9189 Other specified personal risk factors, not elsewhere classified: Secondary | ICD-10-CM | POA: Diagnosis not present

## 2018-07-23 DIAGNOSIS — E559 Vitamin D deficiency, unspecified: Secondary | ICD-10-CM

## 2018-07-23 DIAGNOSIS — I10 Essential (primary) hypertension: Secondary | ICD-10-CM | POA: Diagnosis not present

## 2018-07-23 DIAGNOSIS — E669 Obesity, unspecified: Secondary | ICD-10-CM | POA: Diagnosis not present

## 2018-07-23 DIAGNOSIS — Z6830 Body mass index (BMI) 30.0-30.9, adult: Secondary | ICD-10-CM

## 2018-07-23 MED ORDER — VITAMIN D (ERGOCALCIFEROL) 1.25 MG (50000 UNIT) PO CAPS: 50000.0000 [IU] | ORAL_CAPSULE | ORAL | 0 refills | Status: DC

## 2018-07-23 MED ORDER — LISINOPRIL-HYDROCHLOROTHIAZIDE 10-12.5 MG PO TABS: 1.0000 | ORAL_TABLET | Freq: Every day | ORAL | 0 refills | Status: DC

## 2018-07-24 ENCOUNTER — Encounter: Payer: 59 | Admitting: Family Medicine

## 2018-07-24 NOTE — Progress Notes (Signed)
Office: [phone]  /  Fax: [phone]   HPI:   Chief Complaint: OBESITY Kathleen Acevedo is here to discuss her progress with her obesity treatment plan. She is on the keep a food journal with 1100-1200 calories and 85+ grams of protein daily and is following her eating plan approximately 90 % of the time. She states she is walking for 80 minutes 7 times per week. Alonda did well with weight loss. She reports journaling every day and she is getting closer to 1100 calories than 1200. She is exercising daily.  Her weight is 201 lb (91.2 kg) today and has had a weight loss of 1 pound over a period of 2 to 3 weeks since her last visit. She has lost 11 lbs since starting treatment with [place].  Vitamin D Deficiency Arli has a diagnosis of vitamin D deficiency. She is on prescription Vit D and denies nausea, vomiting or muscle weakness.  At risk for osteopenia and osteoporosis Keyaria is at higher risk of osteopenia and osteoporosis due to vitamin D deficiency.   Hypertension TAEGAN HAIDER is a 57 y.o. female with hypertension. Shannara's blood pressure is normal and she denies chest pain. She is working weight loss to help control her blood pressure with the goal of decreasing her risk of heart attack and stroke. Noha's blood pressure is currently controlled.  ALLERGIES: No Known Allergies  MEDICATIONS: Current Outpatient Medications on File Prior to Visit  Medication Sig Dispense Refill  . empagliflozin (JARDIANCE) 25 MG TABS tablet Take 25 mg by mouth daily. 90 tablet 1  . glucose blood (ONE TOUCH ULTRA TEST) test strip Blood sugars to be checked 2 times daily 100 each 1  . Insulin Glargine (BASAGLAR KWIKPEN) 100 UNIT/ML SOPN Inject 25 Units into the skin every morning.     . metFORMIN (GLUCOPHAGE) 1000 MG tablet Take 1 tablet (1,000 mg total) by mouth 2 (two) times daily. 60 tablet 0  . ONETOUCH DELICA LANCETS MISC by Does not apply route 2 (two) times daily.       No current  facility-administered medications on file prior to visit.     PAST MEDICAL HISTORY: Past Medical History:  Diagnosis Date  . Diabetes mellitus   . Hyperlipidemia   . Hypertension     PAST SURGICAL HISTORY: Past Surgical History:  Procedure Laterality Date  . ABDOMINAL HYSTERECTOMY    . BREAST BIOPSY Left   . OOPHORECTOMY      SOCIAL HISTORY: Social History   Tobacco Use  . Smoking status: Never Smoker  . Smokeless tobacco: Never Used  Substance Use Topics  . Alcohol use: No    Alcohol/week: 0.0 standard drinks  . Drug use: No    FAMILY HISTORY: Family History  Problem Relation Age of Onset  . Diabetes Father   . High blood pressure Father   . Obesity Father   . Colon cancer Neg Hx   . Colon polyps Neg Hx     ROS: Review of Systems  Constitutional: Positive for weight loss.  Cardiovascular: Negative for chest pain.  Gastrointestinal: Negative for nausea and vomiting.  Musculoskeletal:       Negative muscle weakness    PHYSICAL EXAM: Blood pressure 120/73, pulse 63, temperature 98.3 F (36.8 C), temperature source Oral, height 5\' 8"  (1.727 m), weight 201 lb (91.2 kg), SpO2 98 %. Body mass index is 30.56 kg/m. Physical Exam  Constitutional: She is oriented to person, place, and time. She appears well-developed and well-nourished.  Cardiovascular:  Normal rate.  Pulmonary/Chest: Effort normal.  Musculoskeletal: Normal range of motion.  Neurological: She is oriented to person, place, and time.  Skin: Skin is warm and dry.  Psychiatric: She has a normal mood and affect. Her behavior is normal.  Vitals reviewed.   RECENT LABS AND TESTS: BMET    Component Value Date/Time   NA 137 03/27/2018 1158   K 4.3 03/27/2018 1158   CL 97 03/27/2018 1158   CO2 24 03/27/2018 1158   GLUCOSE 91 03/27/2018 1158   GLUCOSE 186 (H) 01/29/2018 0725   BUN 19 03/27/2018 1158   CREATININE 1.14 (H) 03/27/2018 1158   CREATININE 0.89 09/13/2013 1546   CALCIUM 9.6  03/27/2018 1158   GFRNONAA 53 (L) 03/27/2018 1158   GFRAA 61 03/27/2018 1158   Lab Results  Component Value Date   HGBA1C 8.5 (H) 03/27/2018   HGBA1C 7.5 09/01/2017   HGBA1C 6.2 05/04/2016   HGBA1C 9.1 (A) 09/21/2015   HGBA1C 8.1 (A) 03/23/2015   No results found for: INSULIN CBC    Component Value Date/Time   WBC 4.6 01/29/2018 0725   RBC 3.99 01/29/2018 0725   HGB 11.8 (L) 01/29/2018 0725   HCT 36.1 01/29/2018 0725   PLT 222.0 01/29/2018 0725   MCV 90.4 01/29/2018 0725   MCH 29.2 09/13/2013 1546   MCHC 32.7 01/29/2018 0725   RDW 14.3 01/29/2018 0725   LYMPHSABS 1.4 01/29/2018 0725   MONOABS 0.4 01/29/2018 0725   EOSABS 0.2 01/29/2018 0725   BASOSABS 0.0 01/29/2018 0725   Iron/TIBC/Ferritin/ %Sat    Component Value Date/Time   IRON 54 01/29/2018 0725   FERRITIN 15.1 01/29/2018 0725   IRONPCTSAT 15.0 (L) 01/29/2018 0725   Lipid Panel     Component Value Date/Time   CHOL 196 01/29/2018 0725   TRIG 118.0 01/29/2018 0725   HDL 46.90 01/29/2018 0725   CHOLHDL 4 01/29/2018 0725   VLDL 23.6 01/29/2018 0725   LDLCALC 125 (H) 01/29/2018 0725   LDLDIRECT 103.6 08/23/2012 1553   Hepatic Function Panel     Component Value Date/Time   PROT 6.8 03/27/2018 1158   ALBUMIN 4.1 03/27/2018 1158   AST 18 03/27/2018 1158   ALT 17 03/27/2018 1158   ALKPHOS 69 03/27/2018 1158   BILITOT 0.3 03/27/2018 1158   BILIDIR 0.1 12/15/2017 0912   IBILI 0.2 09/13/2013 1546      Component Value Date/Time   TSH 0.902 03/27/2018 1158   TSH 1.67 12/15/2017 0912   TSH 1.42 02/19/2016 1109  Results for Sundstrom, BURNIS KASER (MRN [id number]) as of 07/24/2018 10:10  Ref. Range 03/27/2018 11:58  Vitamin D, 25-Hydroxy Latest Ref Range: 30.0 - 100.0 ng/mL 27.5 (L)    ASSESSMENT AND PLAN: Vitamin D deficiency - Plan: Vitamin D, Ergocalciferol, (DRISDOL) 50000 units CAPS capsule  Essential hypertension - Plan: lisinopril-hydrochlorothiazide (PRINZIDE,ZESTORETIC) 10-12.5 MG tablet  At risk for  osteoporosis  Class 1 obesity with serious comorbidity and body mass index (BMI) of 30.0 to 30.9 in adult, unspecified obesity type  PLAN:  Vitamin D Deficiency Karcyn was informed that low vitamin D levels contributes to fatigue and are associated with obesity, breast, and colon cancer. Tamberly agrees to continue taking prescription Vit D @50 ,000 IU every week #4 and we will refill for 1 month. She will follow up for routine testing of vitamin D, at least 2-3 times per year. She was informed of the risk of over-replacement of vitamin D and agrees to not increase her dose unless she  discusses this with [place] first. Polette agrees to follow up with our clinic in 3 weeks.  At risk for osteopenia and osteoporosis Bobi was given extended  (15 minutes) osteoporosis prevention counseling today. Jaymie is at risk for osteopenia and osteoporsis due to her vitamin D deficiency. She was encouraged to take her vitamin D and follow her higher calcium diet and increase strengthening exercise to help strengthen her bones and decrease her risk of osteopenia and osteoporosis.  Hypertension We discussed sodium restriction, working on healthy weight loss, and a regular exercise program as the means to achieve improved blood pressure control. Shanicka agreed with this plan and agreed to follow up as directed. We will continue to monitor her blood pressure as well as her progress with the above lifestyle modifications. Reia agrees to continue taking lisinopril-hydrochlorothiazide 10-12.5 mg qd #30 and we will refill for 1 month. She will watch for signs of hypotension as she continues her lifestyle modifications. Leeandra agrees to follow up with our clinic in 3 weeks.  Obesity Mairely is currently in the action stage of change. As such, her goal is to continue with weight loss efforts She has agreed to change to keep a food journal with 1200 calories and 90 grams of protein daily Shandria has been instructed to work up to  a goal of 150 minutes of combined cardio and strengthening exercise per week for weight loss and overall health benefits. We discussed the following Behavioral Modification Strategies today: work on meal planning and easy cooking plans and keep a strict food journal   Destyn has agreed to follow up with our clinic in 3 weeks. She was informed of the importance of frequent follow up visits to maximize her success with intensive lifestyle modifications for her multiple health conditions.   OBESITY BEHAVIORAL INTERVENTION VISIT  Today's visit was # 7   Starting weight: 212 lbs Starting date: 03/27/18 Today's weight : 201 lbs  Today's date: 07/23/2018 Total lbs lost to date: 43    ASK: We discussed the diagnosis of obesity with Germaine Pomfret today and Gelene agreed to give [place] permission to discuss obesity behavioral modification therapy today.  ASSESS: Alizon has the diagnosis of obesity and her BMI today is 30.57 Brittinee is in the action stage of change   ADVISE: Troi was educated on the multiple health risks of obesity as well as the benefit of weight loss to improve her health. She was advised of the need for long term treatment and the importance of lifestyle modifications.  AGREE: Multiple dietary modification options and treatment options were discussed and  Halo agreed to the above obesity treatment plan.  Wilhemena Durie, am acting as transcriptionist for Abby Potash, PA-C I, Abby Potash, PA-C have reviewed above note and agree with its content

## 2018-07-31 ENCOUNTER — Encounter (INDEPENDENT_AMBULATORY_CARE_PROVIDER_SITE_OTHER): Payer: Self-pay | Admitting: Physician Assistant

## 2018-08-01 ENCOUNTER — Telehealth: Payer: Self-pay | Admitting: Family Medicine

## 2018-08-01 NOTE — Telephone Encounter (Signed)
Pt came in to drop off CPE release forms. Pt would like to have the forms back by 08/08/18 if at all possible. Pt would like a call when the papers are ready.    CB: [phone]

## 2018-08-07 ENCOUNTER — Ambulatory Visit (HOSPITAL_BASED_OUTPATIENT_CLINIC_OR_DEPARTMENT_OTHER)
Admission: RE | Admit: 2018-08-07 | Discharge: 2018-08-07 | Disposition: A | Discharge status: Home / Self Care | Payer: 59 | Source: Ambulatory Visit | Attending: Family Medicine | Admitting: Family Medicine

## 2018-08-07 ENCOUNTER — Other Ambulatory Visit: Payer: Self-pay | Admitting: *Deleted

## 2018-08-07 ENCOUNTER — Other Ambulatory Visit: Payer: Self-pay | Admitting: Family Medicine

## 2018-08-07 ENCOUNTER — Encounter (HOSPITAL_BASED_OUTPATIENT_CLINIC_OR_DEPARTMENT_OTHER): Payer: Self-pay

## 2018-08-07 DIAGNOSIS — Z1231 Encounter for screening mammogram for malignant neoplasm of breast: Secondary | ICD-10-CM | POA: Insufficient documentation

## 2018-08-07 MED ORDER — FERROUS SULFATE 325 (65 FE) MG PO TABS: 325.0000 mg | ORAL_TABLET | Freq: Two times a day (BID) | ORAL | Status: DC

## 2018-08-07 NOTE — Telephone Encounter (Signed)
Patient transfer back to Dr. Etter Sjogren on 02/22/18 Kathleen Acevedo for acute], seen Dr. Etter Sjogren once for: discuss Endocrinologist appointment on 03/16/18, a referral to Weight Management was placed. There were labs done on 01/29/18 [placed on 08/17/17], which there are also medication discrepancies. The form is regarding weight/exercise program at [facility] and patient sees Weight Management regularly. Information given to provider's MA to decide if this needs to go to Weight Management for completion or if she will fill out/SLS 10/07

## 2018-08-08 NOTE — Telephone Encounter (Signed)
Patient is calling and states she reached out to her Weight Management center and they told her that her doctor needed to fill this out. Patient states the program she is trying to get into, she is needing the paperwork by tomorrow morning. Please contact.   CB# [phone]

## 2018-08-08 NOTE — Telephone Encounter (Signed)
Pt. Calling to check on status of ppwk. Routed to [place], [place] to assess if pt. Needs to see weight management for form completion per Sharon's note 10/2.

## 2018-08-08 NOTE — Telephone Encounter (Signed)
Patient notified that form is ready for pickup.  Copy sent to scan

## 2018-08-09 ENCOUNTER — Encounter (INDEPENDENT_AMBULATORY_CARE_PROVIDER_SITE_OTHER): Payer: Self-pay | Admitting: Physician Assistant

## 2018-08-09 ENCOUNTER — Ambulatory Visit (INDEPENDENT_AMBULATORY_CARE_PROVIDER_SITE_OTHER): Payer: 59 | Admitting: Physician Assistant

## 2018-08-09 VITALS — BP 123/71 | HR 57 | Temp 98.2°F | Ht 68.0 in | Wt 198.0 lb

## 2018-08-09 DIAGNOSIS — Z794 Long term (current) use of insulin: Secondary | ICD-10-CM | POA: Diagnosis not present

## 2018-08-09 DIAGNOSIS — Z6830 Body mass index (BMI) 30.0-30.9, adult: Secondary | ICD-10-CM | POA: Diagnosis not present

## 2018-08-09 DIAGNOSIS — E669 Obesity, unspecified: Secondary | ICD-10-CM | POA: Diagnosis not present

## 2018-08-09 DIAGNOSIS — E119 Type 2 diabetes mellitus without complications: Secondary | ICD-10-CM | POA: Diagnosis not present

## 2018-08-13 NOTE — Progress Notes (Signed)
Office: [phone]  /  Fax: [phone]   HPI:   Chief Complaint: OBESITY Kathleen Acevedo is here to discuss her progress with her obesity treatment plan. She is on the keep a food journal with 1200 calories and 90 grams of protein daily and is following her eating plan approximately 90 % of the time. She states she is walking for 20 minutes 7 times per week. Kathleen Acevedo did well with weight loss. She reports that she has been following the plan closely. She is getting ready to start a workout program for strength training at [facility].  Her weight is 198 lb (89.8 kg) today and has had a weight loss of 3 pounds over a period of 2 to 3 weeks since her last visit. She has lost 14 lbs since starting treatment with [place].  Diabetes II Kathleen Acevedo has a diagnosis of diabetes type II. Kathleen Acevedo states fasting BGs range between 80 and 100, she is taking Agricultural engineer and Jardiance. She states after eating BGs range between 96 and 120. She takes less insulin depending on what her BGs is fasting and after meals. She denies any hypoglycemic episodes. Last A1c was 8.5. She has been working on intensive lifestyle modifications including diet, exercise, and weight loss to help control her blood glucose levels.  ALLERGIES: No Known Allergies  MEDICATIONS: Current Outpatient Medications on File Prior to Visit  Medication Sig Dispense Refill  . empagliflozin (JARDIANCE) 25 MG TABS tablet Take 25 mg by mouth daily. 90 tablet 1  . ferrous sulfate 325 (65 FE) MG tablet Take 1 tablet (325 mg total) by mouth 2 (two) times daily.    [name] glucose blood (ONE TOUCH ULTRA TEST) test strip Blood sugars to be checked 2 times daily 100 each 1  . Insulin Glargine (BASAGLAR KWIKPEN) 100 UNIT/ML SOPN Inject 10 Units into the skin every morning.     [name] lisinopril-hydrochlorothiazide (PRINZIDE,ZESTORETIC) 10-12.5 MG tablet Take 1 tablet by mouth daily. 30 tablet 0  . metFORMIN (GLUCOPHAGE) 1000 MG tablet Take 1 tablet (1,000 mg total) by mouth 2  (two) times daily. 60 tablet 0  . ONETOUCH DELICA LANCETS MISC by Does not apply route 2 (two) times daily.      . Vitamin D, Ergocalciferol, (DRISDOL) 50000 units CAPS capsule Take 1 capsule (50,000 Units total) by mouth every 7 (seven) days. 4 capsule 0   No current facility-administered medications on file prior to visit.     PAST MEDICAL HISTORY: Past Medical History:  Diagnosis Date  . Diabetes mellitus   . Hyperlipidemia   . Hypertension     PAST SURGICAL HISTORY: Past Surgical History:  Procedure Laterality Date  . ABDOMINAL HYSTERECTOMY    . BREAST BIOPSY Left   . OOPHORECTOMY      SOCIAL HISTORY: Social History   Tobacco Use  . Smoking status: Never Smoker  . Smokeless tobacco: Never Used  Substance Use Topics  . Alcohol use: No    Alcohol/week: 0.0 standard drinks  . Drug use: No    FAMILY HISTORY: Family History  Problem Relation Age of Onset  . Diabetes Father   . High blood pressure Father   . Obesity Father   . Colon cancer Neg Hx   . Colon polyps Neg Hx     ROS: Review of Systems  Constitutional: Positive for weight loss.  Endo/Heme/Allergies:       Negative hypoglycemia    PHYSICAL EXAM: Blood pressure 123/71, pulse (!) 57, temperature 98.2 F (36.8 C), temperature source  Oral, height 5\' 8"  (1.727 m), weight 198 lb (89.8 kg), SpO2 100 %. Body mass index is 30.11 kg/m. Physical Exam  Constitutional: She is oriented to person, place, and time. She appears well-developed and well-nourished.  Cardiovascular: Normal rate.  Pulmonary/Chest: Effort normal.  Musculoskeletal: Normal range of motion.  Neurological: She is oriented to person, place, and time.  Skin: Skin is warm and dry.  Psychiatric: She has a normal mood and affect. Her behavior is normal.  Vitals reviewed.   RECENT LABS AND TESTS: BMET    Component Value Date/Time   NA 137 03/27/2018 1158   K 4.3 03/27/2018 1158   CL 97 03/27/2018 1158   CO2 24 03/27/2018 1158    GLUCOSE 91 03/27/2018 1158   GLUCOSE 186 (H) 01/29/2018 0725   BUN 19 03/27/2018 1158   CREATININE 1.14 (H) 03/27/2018 1158   CREATININE 0.89 09/13/2013 1546   CALCIUM 9.6 03/27/2018 1158   GFRNONAA 53 (L) 03/27/2018 1158   GFRAA 61 03/27/2018 1158   Lab Results  Component Value Date   HGBA1C 8.5 (H) 03/27/2018   HGBA1C 7.5 09/01/2017   HGBA1C 6.2 05/04/2016   HGBA1C 9.1 (A) 09/21/2015   HGBA1C 8.1 (A) 03/23/2015   No results found for: INSULIN CBC    Component Value Date/Time   WBC 4.6 01/29/2018 0725   RBC 3.99 01/29/2018 0725   HGB 11.8 (L) 01/29/2018 0725   HCT 36.1 01/29/2018 0725   PLT 222.0 01/29/2018 0725   MCV 90.4 01/29/2018 0725   MCH 29.2 09/13/2013 1546   MCHC 32.7 01/29/2018 0725   RDW 14.3 01/29/2018 0725   LYMPHSABS 1.4 01/29/2018 0725   MONOABS 0.4 01/29/2018 0725   EOSABS 0.2 01/29/2018 0725   BASOSABS 0.0 01/29/2018 0725   Iron/TIBC/Ferritin/ %Sat    Component Value Date/Time   IRON 54 01/29/2018 0725   FERRITIN 15.1 01/29/2018 0725   IRONPCTSAT 15.0 (L) 01/29/2018 0725   Lipid Panel     Component Value Date/Time   CHOL 196 01/29/2018 0725   TRIG 118.0 01/29/2018 0725   HDL 46.90 01/29/2018 0725   CHOLHDL 4 01/29/2018 0725   VLDL 23.6 01/29/2018 0725   LDLCALC 125 (H) 01/29/2018 0725   LDLDIRECT 103.6 08/23/2012 1553   Hepatic Function Panel     Component Value Date/Time   PROT 6.8 03/27/2018 1158   ALBUMIN 4.1 03/27/2018 1158   AST 18 03/27/2018 1158   ALT 17 03/27/2018 1158   ALKPHOS 69 03/27/2018 1158   BILITOT 0.3 03/27/2018 1158   BILIDIR 0.1 12/15/2017 0912   IBILI 0.2 09/13/2013 1546      Component Value Date/Time   TSH 0.902 03/27/2018 1158   TSH 1.67 12/15/2017 0912   TSH 1.42 02/19/2016 1109    ASSESSMENT AND PLAN: Type 2 diabetes mellitus without complication, with long-term current use of insulin (HCC)  Class 1 obesity with serious comorbidity and body mass index (BMI) of 30.0 to 30.9 in adult, unspecified  obesity type  PLAN:  Diabetes II Kathleen Acevedo has been given extensive diabetes education by myself today including ideal fasting and post-prandial blood glucose readings, individual ideal Hgb A1c goals and hypoglycemia prevention. We discussed the importance of good blood sugar control to decrease the likelihood of diabetic complications such as nephropathy, neuropathy, limb loss, blindness, coronary artery disease, and death. We discussed the importance of intensive lifestyle modification including diet, exercise and weight loss as the first line treatment for diabetes. Kathleen Acevedo agrees to continue her diabetes medications, diet,  and exercise, and we will recheck labs at next visit. Kathleen Acevedo agrees to follow up with our clinic in 2 weeks.  I spent > than 50% of the 15 minute visit on counseling as documented in the note.  Obesity Kathleen Acevedo is currently in the action stage of change. As such, her goal is to continue with weight loss efforts She has agreed to keep a food journal with 1200 calories and 90 grams of protein daily Kathleen Acevedo has been instructed to work up to a goal of 150 minutes of combined cardio and strengthening exercise per week for weight loss and overall health benefits. We discussed the following Behavioral Modification Strategies today: work on meal planning and easy cooking plans and planning for success   Myeasha has agreed to follow up with our clinic in 2 weeks. She was informed of the importance of frequent follow up visits to maximize her success with intensive lifestyle modifications for her multiple health conditions.   OBESITY BEHAVIORAL INTERVENTION VISIT  Today's visit was # 8   Starting weight: 212 lbs Starting date: 03/27/18 Today's weight : 198 lbs Today's date: 08/09/2018 Total lbs lost to date: 14    ASK: We discussed the diagnosis of obesity with Kathleen Acevedo today and Kathleen Acevedo agreed to give [place] permission to discuss obesity behavioral modification therapy  today.  ASSESS: Kathleen Acevedo has the diagnosis of obesity and her BMI today is 30.11 Kathleen Acevedo is in the action stage of change   ADVISE: Kathleen Acevedo was educated on the multiple health risks of obesity as well as the benefit of weight loss to improve her health. She was advised of the need for long term treatment and the importance of lifestyle modifications.  AGREE: Multiple dietary modification options and treatment options were discussed and  Kathleen Acevedo agreed to the above obesity treatment plan.  Kathleen Acevedo, am acting as transcriptionist for Abby Potash, PA-C I, Abby Potash, PA-C have reviewed above note and agree with its content

## 2018-08-23 ENCOUNTER — Ambulatory Visit (INDEPENDENT_AMBULATORY_CARE_PROVIDER_SITE_OTHER): Payer: 59 | Admitting: Physician Assistant

## 2018-08-23 ENCOUNTER — Encounter (INDEPENDENT_AMBULATORY_CARE_PROVIDER_SITE_OTHER): Payer: Self-pay | Admitting: Physician Assistant

## 2018-08-23 VITALS — BP 136/73 | HR 54 | Temp 98.3°F | Ht 68.0 in | Wt 196.0 lb

## 2018-08-23 DIAGNOSIS — Z9189 Other specified personal risk factors, not elsewhere classified: Secondary | ICD-10-CM | POA: Diagnosis not present

## 2018-08-23 DIAGNOSIS — E119 Type 2 diabetes mellitus without complications: Secondary | ICD-10-CM | POA: Diagnosis not present

## 2018-08-23 DIAGNOSIS — E559 Vitamin D deficiency, unspecified: Secondary | ICD-10-CM

## 2018-08-23 DIAGNOSIS — I10 Essential (primary) hypertension: Secondary | ICD-10-CM | POA: Diagnosis not present

## 2018-08-23 DIAGNOSIS — Z6830 Body mass index (BMI) 30.0-30.9, adult: Secondary | ICD-10-CM

## 2018-08-23 DIAGNOSIS — E7849 Other hyperlipidemia: Secondary | ICD-10-CM

## 2018-08-23 DIAGNOSIS — E669 Obesity, unspecified: Secondary | ICD-10-CM

## 2018-08-23 MED ORDER — LISINOPRIL-HYDROCHLOROTHIAZIDE 10-12.5 MG PO TABS: 1.0000 | ORAL_TABLET | Freq: Every day | ORAL | 0 refills | Status: DC

## 2018-08-23 MED ORDER — EMPAGLIFLOZIN 25 MG PO TABS: 25.0000 mg | ORAL_TABLET | Freq: Every day | ORAL | 0 refills | Status: DC

## 2018-08-23 MED ORDER — VITAMIN D (ERGOCALCIFEROL) 1.25 MG (50000 UNIT) PO CAPS: 50000.0000 [IU] | ORAL_CAPSULE | ORAL | 0 refills | Status: DC

## 2018-08-24 LAB — COMPREHENSIVE METABOLIC PANEL
A/G RATIO: 1.8 (ref 1.2–2.2)
ALT: 19 IU/L (ref 0–32)
AST: 24 IU/L (ref 0–40)
Albumin: 4.6 g/dL (ref 3.5–5.5)
Alkaline Phosphatase: 66 IU/L (ref 39–117)
BUN/Creatinine Ratio: 24 — ABNORMAL HIGH (ref 9–23)
BUN: 26 mg/dL — AB (ref 6–24)
Bilirubin Total: 0.3 mg/dL (ref 0.0–1.2)
CALCIUM: 10 mg/dL (ref 8.7–10.2)
CHLORIDE: 101 mmol/L (ref 96–106)
CO2: 24 mmol/L (ref 20–29)
CREATININE: 1.07 mg/dL — AB (ref 0.57–1.00)
GFR, EST AFRICAN AMERICAN: 66 mL/min/{1.73_m2} (ref >59)
GFR, EST NON AFRICAN AMERICAN: 57 mL/min/{1.73_m2} — AB (ref >59)
Globulin, Total: 2.5 g/dL (ref 1.5–4.5)
Glucose: 60 mg/dL — ABNORMAL LOW (ref 65–99)
Potassium: 4.2 mmol/L (ref 3.5–5.2)
SODIUM: 140 mmol/L (ref 134–144)
TOTAL PROTEIN: 7.1 g/dL (ref 6.0–8.5)

## 2018-08-24 LAB — LIPID PANEL WITH LDL/HDL RATIO
CHOLESTEROL TOTAL: 223 mg/dL — AB (ref 100–199)
HDL: 58 mg/dL (ref >39)
LDL CALC: 147 mg/dL — AB (ref 0–99)
LDl/HDL Ratio: 2.5 ratio (ref 0.0–3.2)
Triglycerides: 90 mg/dL (ref 0–149)
VLDL CHOLESTEROL CAL: 18 mg/dL (ref 5–40)

## 2018-08-24 LAB — HEMOGLOBIN A1C
ESTIMATED AVERAGE GLUCOSE: 140 mg/dL
Hgb A1c MFr Bld: 6.5 % — ABNORMAL HIGH (ref 4.8–5.6)

## 2018-08-24 LAB — VITAMIN D 25 HYDROXY (VIT D DEFICIENCY, FRACTURES): VIT D 25 HYDROXY: 53.2 ng/mL (ref 30.0–100.0)

## 2018-08-27 NOTE — Progress Notes (Signed)
Office: [phone]  /  Fax: [phone]   HPI:   Chief Complaint: OBESITY Kathleen Acevedo is here to discuss her progress with her obesity treatment plan. She is on the keep a food journal with 1200 calories and 90 grams of protein daily and is following her eating plan approximately 90 % of the time. She states she is walking and strength training for 60-90 minutes 3 times per week. Isolde did well with weight loss. She reports not getting all of her protein in every day. She is working out with a program at [facility].  Her weight is 196 lb (88.9 kg) today and has had a weight loss of 2 pounds over a period of 2 weeks since her last visit. She has lost 16 lbs since starting treatment with [place].  Diabetes II Setareh has a diagnosis of diabetes type II. Akshara is on [place]. Last A1c was 6.5. She has been working on intensive lifestyle modifications including diet, exercise, and weight loss to help control her blood glucose levels.  Vitamin D Deficiency Miral has a diagnosis of vitamin D deficiency. She is currently taking prescription Vit D and denies nausea, vomiting or muscle weakness.  At risk for osteopenia and osteoporosis Ainslee is at higher risk of osteopenia and osteoporosis due to vitamin D deficiency.   Hypertension CHAU SAVELL is a 57 y.o. female with hypertension. Batina's blood pressure is normal. She denies chest pain and she is on lisinopril-hydrochlorothiazide. She is working weight loss to help control her blood pressure with the goal of decreasing her risk of heart attack and stroke. Rivers's blood pressure is currently controlled.  Hyperlipidemia Robby has hyperlipidemia and has been trying to improve her cholesterol levels with intensive lifestyle modification including a low saturated fat diet, exercise and weight loss. She is not on medication and denies any chest pain, claudication or myalgias.  ALLERGIES: No Known Allergies  MEDICATIONS: Current  Outpatient Medications on File Prior to Visit  Medication Sig Dispense Refill  . ferrous sulfate 325 (65 FE) MG tablet Take 1 tablet (325 mg total) by mouth 2 (two) times daily.    [name] glucose blood (ONE TOUCH ULTRA TEST) test strip Blood sugars to be checked 2 times daily 100 each 1  . Insulin Glargine (BASAGLAR KWIKPEN) 100 UNIT/ML SOPN Inject 10 Units into the skin every morning.     . metFORMIN (GLUCOPHAGE) 1000 MG tablet Take 1 tablet (1,000 mg total) by mouth 2 (two) times daily. 60 tablet 0  . ONETOUCH DELICA LANCETS MISC by Does not apply route 2 (two) times daily.       No current facility-administered medications on file prior to visit.     PAST MEDICAL HISTORY: Past Medical History:  Diagnosis Date  . Diabetes mellitus   . Hyperlipidemia   . Hypertension     PAST SURGICAL HISTORY: Past Surgical History:  Procedure Laterality Date  . ABDOMINAL HYSTERECTOMY    . BREAST BIOPSY Left   . OOPHORECTOMY      SOCIAL HISTORY: Social History   Tobacco Use  . Smoking status: Never Smoker  . Smokeless tobacco: Never Used  Substance Use Topics  . Alcohol use: No    Alcohol/week: 0.0 standard drinks  . Drug use: No    FAMILY HISTORY: Family History  Problem Relation Age of Onset  . Diabetes Father   . High blood pressure Father   . Obesity Father   . Colon cancer Neg Hx   . Colon polyps  Neg Hx     ROS: Review of Systems  Constitutional: Positive for weight loss.  Cardiovascular: Negative for chest pain and claudication.  Gastrointestinal: Negative for nausea and vomiting.  Musculoskeletal: Negative for myalgias.       Negative muscle weakness  Endo/Heme/Allergies:       Negative hypoglycemia    PHYSICAL EXAM: Blood pressure 136/73, pulse (!) 54, temperature 98.3 F (36.8 C), temperature source Oral, height 5\' 8"  (1.727 m), weight 196 lb (88.9 kg), SpO2 100 %. Body mass index is 29.8 kg/m. Physical Exam  Constitutional: She is oriented to person, place, and  time. She appears well-developed and well-nourished.  Cardiovascular: Normal rate.  Pulmonary/Chest: Effort normal.  Musculoskeletal: Normal range of motion.  Neurological: She is oriented to person, place, and time.  Skin: Skin is warm and dry.  Psychiatric: She has a normal mood and affect. Her behavior is normal.  Vitals reviewed.   RECENT LABS AND TESTS: BMET    Component Value Date/Time   NA 140 08/23/2018 0922   K 4.2 08/23/2018 0922   CL 101 08/23/2018 0922   CO2 24 08/23/2018 0922   GLUCOSE 60 (L) 08/23/2018 0922   GLUCOSE 186 (H) 01/29/2018 0725   BUN 26 (H) 08/23/2018 0922   CREATININE 1.07 (H) 08/23/2018 0922   CREATININE 0.89 09/13/2013 1546   CALCIUM 10.0 08/23/2018 0922   GFRNONAA 57 (L) 08/23/2018 0922   GFRAA 66 08/23/2018 0922   Lab Results  Component Value Date   HGBA1C 6.5 (H) 08/23/2018   HGBA1C 8.5 (H) 03/27/2018   HGBA1C 7.5 09/01/2017   HGBA1C 6.2 05/04/2016   HGBA1C 9.1 (A) 09/21/2015   No results found for: INSULIN CBC    Component Value Date/Time   WBC 4.6 01/29/2018 0725   RBC 3.99 01/29/2018 0725   HGB 11.8 (L) 01/29/2018 0725   HCT 36.1 01/29/2018 0725   PLT 222.0 01/29/2018 0725   MCV 90.4 01/29/2018 0725   MCH 29.2 09/13/2013 1546   MCHC 32.7 01/29/2018 0725   RDW 14.3 01/29/2018 0725   LYMPHSABS 1.4 01/29/2018 0725   MONOABS 0.4 01/29/2018 0725   EOSABS 0.2 01/29/2018 0725   BASOSABS 0.0 01/29/2018 0725   Iron/TIBC/Ferritin/ %Sat    Component Value Date/Time   IRON 54 01/29/2018 0725   FERRITIN 15.1 01/29/2018 0725   IRONPCTSAT 15.0 (L) 01/29/2018 0725   Lipid Panel     Component Value Date/Time   CHOL 223 (H) 08/23/2018 0922   TRIG 90 08/23/2018 0922   HDL 58 08/23/2018 0922   CHOLHDL 4 01/29/2018 0725   VLDL 23.6 01/29/2018 0725   LDLCALC 147 (H) 08/23/2018 0922   LDLDIRECT 103.6 08/23/2012 1553   Hepatic Function Panel     Component Value Date/Time   PROT 7.1 08/23/2018 0922   ALBUMIN 4.6 08/23/2018 0922    AST 24 08/23/2018 0922   ALT 19 08/23/2018 0922   ALKPHOS 66 08/23/2018 0922   BILITOT 0.3 08/23/2018 0922   BILIDIR 0.1 12/15/2017 0912   IBILI 0.2 09/13/2013 1546      Component Value Date/Time   TSH 0.902 03/27/2018 1158   TSH 1.67 12/15/2017 0912   TSH 1.42 02/19/2016 1109  Results for Punt, MIAKODA MCMILLION (MRN [id number]) as of 08/27/2018 09:54  Ref. Range 08/23/2018 09:22  Vitamin D, 25-Hydroxy Latest Ref Range: 30.0 - 100.0 ng/mL 53.2    ASSESSMENT AND PLAN: Type 2 diabetes mellitus without complication, without long-term current use of insulin (HCC) - Plan: Comprehensive metabolic  panel, Hemoglobin A1c, empagliflozin (JARDIANCE) 25 MG TABS tablet  Vitamin D deficiency - Plan: VITAMIN D 25 Hydroxy (Vit-D Deficiency, Fractures), Vitamin D, Ergocalciferol, (DRISDOL) 50000 units CAPS capsule  Essential hypertension - Plan: lisinopril-hydrochlorothiazide (PRINZIDE,ZESTORETIC) 10-12.5 MG tablet  Other hyperlipidemia - Plan: Lipid Panel With LDL/HDL Ratio  At risk for osteoporosis  Class 1 obesity with serious comorbidity and body mass index (BMI) of 30.0 to 30.9 in adult, unspecified obesity type - Starting BMI greater then 30  PLAN:  Diabetes II Earlyn has been given extensive diabetes education by myself today including ideal fasting and post-prandial blood glucose readings, individual ideal Hgb A1c goals and hypoglycemia prevention. We discussed the importance of good blood sugar control to decrease the likelihood of diabetic complications such as nephropathy, neuropathy, limb loss, blindness, coronary artery disease, and death. We discussed the importance of intensive lifestyle modification including diet, exercise and weight loss as the first line treatment for diabetes. Zachary agrees to continue Jardiance 25 mg qd #30 and we will refill for 1 month. We will check labs and Gaylynn agrees to follow up with our clinic in 3 weeks.  Vitamin D Deficiency [place] was informed that low  vitamin D levels contributes to fatigue and are associated with obesity, breast, and colon cancer. Raynee agrees to continue taking prescription Vit D @50 ,000 IU every week #4 and we will refill for 1 month. She will follow up for routine testing of vitamin D, at least 2-3 times per year. She was informed of the risk of over-replacement of vitamin D and agrees to not increase her dose unless she discusses this with [place] first. We will check labs and Ermina agrees to follow up with our clinic in 3 weeks.  At risk for osteopenia and osteoporosis Anyla was given extended (15 minutes) osteoporosis prevention counseling today. Jammi is at risk for osteopenia and osteoporsis due to her vitamin D deficiency. She was encouraged to take her vitamin D and follow her higher calcium diet and increase strengthening exercise to help strengthen her bones and decrease her risk of osteopenia and osteoporosis.  Hypertension We discussed sodium restriction, working on healthy weight loss, and a regular exercise program as the means to achieve improved blood pressure control. Marcena agreed with this plan and agreed to follow up as directed. We will continue to monitor her blood pressure as well as her progress with the above lifestyle modifications. Antonique agrees to continue taking lisnopril-hydrochlorothiazide 10-12.5 mg qd #30 and we will refill for 1 month. She will watch for signs of hypotension as she continues her lifestyle modifications. Jaliza agrees to follow up with our clinic in 3 weeks.  Hyperlipidemia Beyonca was informed of the American Heart Association Guidelines emphasizing intensive lifestyle modifications as the first line treatment for hyperlipidemia. We discussed many lifestyle modifications today in depth, and Gabreille will continue to work on decreasing saturated fats such as fatty red meat, butter and many fried foods. She will also increase vegetables and lean protein in her diet and continue to work on  diet, exercise, and weight loss efforts. Elianna agrees to follow up with our clinic in 3 weeks.  Obesity Anasha is currently in the action stage of change. As such, her goal is to continue with weight loss efforts She has agreed to keep a food journal with 1200 calories and 90 grams of protein daily Dickie has been instructed to work up to a goal of 150 minutes of combined cardio and strengthening exercise per week for weight  loss and overall health benefits. We discussed the following Behavioral Modification Strategies today: increasing lean protein intake and work on meal planning and easy cooking plans   Reigan has agreed to follow up with our clinic in 3 weeks. She was informed of the importance of frequent follow up visits to maximize her success with intensive lifestyle modifications for her multiple health conditions.   OBESITY BEHAVIORAL INTERVENTION VISIT  Today's visit was # 9   Starting weight: 212 lbs Starting date: 03/27/18 Today's weight : 196 lbs  Today's date: 08/23/2018 Total lbs lost to date: 16    ASK: We discussed the diagnosis of obesity with Germaine Pomfret today and Kalan agreed to give [place] permission to discuss obesity behavioral modification therapy today.  ASSESS: Zonya has the diagnosis of obesity and her BMI today is 29.81 Nyela is in the action stage of change   ADVISE: Zakayla was educated on the multiple health risks of obesity as well as the benefit of weight loss to improve her health. She was advised of the need for long term treatment and the importance of lifestyle modifications.  AGREE: Multiple dietary modification options and treatment options were discussed and  Kanesha agreed to the above obesity treatment plan.  Wilhemena Durie, am acting as transcriptionist for Abby Potash, PA-C I, Abby Potash, PA-C have reviewed above note and agree with its content

## 2018-09-17 ENCOUNTER — Ambulatory Visit (INDEPENDENT_AMBULATORY_CARE_PROVIDER_SITE_OTHER): Payer: 59 | Admitting: Physician Assistant

## 2018-09-17 ENCOUNTER — Encounter (INDEPENDENT_AMBULATORY_CARE_PROVIDER_SITE_OTHER): Payer: Self-pay | Admitting: Physician Assistant

## 2018-09-17 VITALS — BP 126/69 | HR 59 | Temp 98.4°F | Ht 68.0 in | Wt 193.0 lb

## 2018-09-17 DIAGNOSIS — E669 Obesity, unspecified: Secondary | ICD-10-CM

## 2018-09-17 DIAGNOSIS — Z9189 Other specified personal risk factors, not elsewhere classified: Secondary | ICD-10-CM

## 2018-09-17 DIAGNOSIS — I10 Essential (primary) hypertension: Secondary | ICD-10-CM

## 2018-09-17 DIAGNOSIS — Z6830 Body mass index (BMI) 30.0-30.9, adult: Secondary | ICD-10-CM

## 2018-09-17 DIAGNOSIS — E559 Vitamin D deficiency, unspecified: Secondary | ICD-10-CM

## 2018-09-17 DIAGNOSIS — E66811 Obesity, class 1: Secondary | ICD-10-CM

## 2018-09-17 DIAGNOSIS — E119 Type 2 diabetes mellitus without complications: Secondary | ICD-10-CM

## 2018-09-17 MED ORDER — LISINOPRIL-HYDROCHLOROTHIAZIDE 10-12.5 MG PO TABS: 1.0000 | ORAL_TABLET | Freq: Every day | ORAL | 0 refills | Status: DC

## 2018-09-17 MED ORDER — VITAMIN D (ERGOCALCIFEROL) 1.25 MG (50000 UNIT) PO CAPS: 50000.0000 [IU] | ORAL_CAPSULE | ORAL | 0 refills | Status: DC

## 2018-09-20 NOTE — Progress Notes (Signed)
Office: [phone]  /  Fax: [phone]   HPI:   Chief Complaint: OBESITY Kathleen Acevedo is here to discuss her progress with her obesity treatment plan. She is keeping a food journal with 1200 calories and 90 grams of protein and is following her eating plan approximately 80 % of the time. She states she is doing water aerobics and strength training 30 to 60 minutes 5 times per week. Kathleen Acevedo did very well with weight loss. She reports getting more protein in at breakfast. She is also working out 3 times weekly with the [employer].  Her weight is 193 lb (87.5 kg) today and has had a weight loss of 3 pounds over a period of 3 weeks since her last visit. She has lost 19 lbs since starting treatment with [place].  Hypertension Kathleen Acevedo is a 57 y.o. female with hypertension. She is working on weight loss to help control her blood pressure with the goal of decreasing her risk of heart attack and stroke. [name]'s blood pressure is normal. Kathleen Acevedo denies chest pain.  At risk for cardiovascular disease Kathleen Acevedo is at a higher than average risk for cardiovascular disease due to hypertension and obesity. She currently denies any chest pain.  Vitamin D deficiency Kathleen Acevedo has a diagnosis of vitamin D deficiency. She is currently taking vit D and denies nausea, vomiting, or muscle weakness.  Diabetes II Kathleen Acevedo has a diagnosis of diabetes type II. Kathleen Acevedo states that her fasting BG's range between 80 and 90 and she denies any hypoglycemic episodes. Last A1c was 6.5 on 08/23/18. She is on Basaglar 10 units qAM and Jardiance 25mg . She has been working on intensive lifestyle modifications including diet, exercise, and weight loss to help control her blood glucose levels. She denies nausea, vomiting, and diarrhea.  ALLERGIES: No Known Allergies  MEDICATIONS: Current Outpatient Medications on File Prior to Visit  Medication Sig Dispense Refill  . empagliflozin (JARDIANCE) 25 MG TABS tablet Take 25 mg by mouth  daily. 30 tablet 0  . ferrous sulfate 325 (65 FE) MG tablet Take 1 tablet (325 mg total) by mouth 2 (two) times daily.    [name] glucose blood (ONE TOUCH ULTRA TEST) test strip Blood sugars to be checked 2 times daily 100 each 1  . Insulin Glargine (BASAGLAR KWIKPEN) 100 UNIT/ML SOPN Inject 10 Units into the skin every morning.     . metFORMIN (GLUCOPHAGE) 1000 MG tablet Take 1 tablet (1,000 mg total) by mouth 2 (two) times daily. 60 tablet 0  . ONETOUCH DELICA LANCETS MISC by Does not apply route 2 (two) times daily.       No current facility-administered medications on file prior to visit.     PAST MEDICAL HISTORY: Past Medical History:  Diagnosis Date  . Diabetes mellitus   . Hyperlipidemia   . Hypertension     PAST SURGICAL HISTORY: Past Surgical History:  Procedure Laterality Date  . ABDOMINAL HYSTERECTOMY    . BREAST BIOPSY Left   . OOPHORECTOMY      SOCIAL HISTORY: Social History   Tobacco Use  . Smoking status: Never Smoker  . Smokeless tobacco: Never Used  Substance Use Topics  . Alcohol use: No    Alcohol/week: 0.0 standard drinks  . Drug use: No    FAMILY HISTORY: Family History  Problem Relation Age of Onset  . Diabetes Father   . High blood pressure Father   . Obesity Father   . Colon cancer Neg Hx   . Colon  polyps Neg Hx     ROS: Review of Systems  Constitutional: Positive for weight loss.  Cardiovascular: Negative for chest pain.  Gastrointestinal: Negative for diarrhea, nausea and vomiting.  Musculoskeletal:       Negative for muscle weakness.  Endo/Heme/Allergies:       Negative for hypoglycemia.    PHYSICAL EXAM: Blood pressure 126/69, pulse (!) 59, temperature 98.4 F (36.9 C), temperature source Oral, height 5\' 8"  (1.727 m), weight 193 lb (87.5 kg), SpO2 100 %. Body mass index is 29.35 kg/m. Physical Exam  Constitutional: She is oriented to person, place, and time. She appears well-developed and well-nourished.  Cardiovascular: Normal  rate.  Pulmonary/Chest: Effort normal.  Musculoskeletal: Normal range of motion.  Neurological: She is oriented to person, place, and time.  Skin: Skin is warm and dry.  Psychiatric: She has a normal mood and affect. Her behavior is normal.  Vitals reviewed.   RECENT LABS AND TESTS: BMET    Component Value Date/Time   NA 140 08/23/2018 0922   K 4.2 08/23/2018 0922   CL 101 08/23/2018 0922   CO2 24 08/23/2018 0922   GLUCOSE 60 (L) 08/23/2018 0922   GLUCOSE 186 (H) 01/29/2018 0725   BUN 26 (H) 08/23/2018 0922   CREATININE 1.07 (H) 08/23/2018 0922   CREATININE 0.89 09/13/2013 1546   CALCIUM 10.0 08/23/2018 0922   GFRNONAA 57 (L) 08/23/2018 0922   GFRAA 66 08/23/2018 0922   Lab Results  Component Value Date   HGBA1C 6.5 (H) 08/23/2018   HGBA1C 8.5 (H) 03/27/2018   HGBA1C 7.5 09/01/2017   HGBA1C 6.2 05/04/2016   HGBA1C 9.1 (A) 09/21/2015   No results found for: INSULIN CBC    Component Value Date/Time   WBC 4.6 01/29/2018 0725   RBC 3.99 01/29/2018 0725   HGB 11.8 (L) 01/29/2018 0725   HCT 36.1 01/29/2018 0725   PLT 222.0 01/29/2018 0725   MCV 90.4 01/29/2018 0725   MCH 29.2 09/13/2013 1546   MCHC 32.7 01/29/2018 0725   RDW 14.3 01/29/2018 0725   LYMPHSABS 1.4 01/29/2018 0725   MONOABS 0.4 01/29/2018 0725   EOSABS 0.2 01/29/2018 0725   BASOSABS 0.0 01/29/2018 0725   Iron/TIBC/Ferritin/ %Sat    Component Value Date/Time   IRON 54 01/29/2018 0725   FERRITIN 15.1 01/29/2018 0725   IRONPCTSAT 15.0 (L) 01/29/2018 0725   Lipid Panel     Component Value Date/Time   CHOL 223 (H) 08/23/2018 0922   TRIG 90 08/23/2018 0922   HDL 58 08/23/2018 0922   CHOLHDL 4 01/29/2018 0725   VLDL 23.6 01/29/2018 0725   LDLCALC 147 (H) 08/23/2018 0922   LDLDIRECT 103.6 08/23/2012 1553   Hepatic Function Panel     Component Value Date/Time   PROT 7.1 08/23/2018 0922   ALBUMIN 4.6 08/23/2018 0922   AST 24 08/23/2018 0922   ALT 19 08/23/2018 0922   ALKPHOS 66 08/23/2018 0922    BILITOT 0.3 08/23/2018 0922   BILIDIR 0.1 12/15/2017 0912   IBILI 0.2 09/13/2013 1546      Component Value Date/Time   TSH 0.902 03/27/2018 1158   TSH 1.67 12/15/2017 0912   TSH 1.42 02/19/2016 1109   Results for Filion, AARYANA BETKE (MRN [id number]) as of 09/20/2018 10:34  Ref. Range 08/23/2018 09:22  Vitamin D, 25-Hydroxy Latest Ref Range: 30.0 - 100.0 ng/mL 53.2   ASSESSMENT AND PLAN: Essential hypertension - Plan: lisinopril-hydrochlorothiazide (PRINZIDE,ZESTORETIC) 10-12.5 MG tablet  Vitamin D deficiency - Plan: Vitamin D, Ergocalciferol, (  DRISDOL) 1.25 MG (50000 UT) CAPS capsule  Type 2 diabetes mellitus without complication, without long-term current use of insulin (HCC)  At risk for heart disease  Class 1 obesity with serious comorbidity and body mass index (BMI) of 30.0 to 30.9 in adult, unspecified obesity type - Beginning BMI >30  PLAN:  Hypertension We discussed sodium restriction, working on healthy weight loss, and a regular exercise program as the means to achieve improved blood pressure control. We will continue to monitor her blood pressure as well as her progress with the above lifestyle modifications. She will continue her lisinopril/HCTZ 10-12.5mg  #30 with no refills and will watch for signs of hypotension as she continues her lifestyle modifications. Arieona agreed with this plan and agreed to follow up as directed in 3 weeks.  Cardiovascular risk counseling Kathleen Acevedo was given extended (15 minutes) coronary artery disease prevention counseling today. She is 57 y.o. female and has risk factors for heart disease including hypertension and obesity. We discussed intensive lifestyle modifications today with an emphasis on specific weight loss instructions and strategies. Pt was also informed of the importance of increasing exercise and decreasing saturated fats to help prevent heart disease.  Vitamin D Deficiency Kathleen Acevedo was informed that low vitamin D levels contributes to  fatigue and are associated with obesity, breast, and colon cancer. She agrees to continue to change to take prescription Vit D @50 ,000 IU every other week #2 and will follow up for routine testing of vitamin D, at least 2-3 times per year. She was informed of the risk of over-replacement of vitamin D and agrees to not increase her dose unless she discusses this with [place] first. Camora agrees to follow up as directed.  Diabetes II Kathleen Acevedo has been given extensive diabetes education by myself today including ideal fasting and post-prandial blood glucose readings, individual ideal Hgb A1c goals, and hypoglycemia prevention. We discussed the importance of good blood sugar control to decrease the likelihood of diabetic complications such as nephropathy, neuropathy, limb loss, blindness, coronary artery disease, and death. We discussed the importance of intensive lifestyle modification including diet, exercise and weight loss as the first line treatment for diabetes. Kathleen Acevedo agrees to continue her diabetes medications, diet, weight loss, and will follow up at the agreed upon time in 3 weeks.  Obesity Kathleen Acevedo is currently in the action stage of change. As such, her goal is to continue with weight loss efforts. She has agreed to keep a food journal with 1200 calories and 90 protein daily. Kathleen Acevedo has been instructed to work up to a goal of 150 minutes of combined cardio and strengthening exercise per week for weight loss and overall health benefits. We discussed the following Behavioral Modification Strategies today: work on meal planning and easy cooking plans and holiday eating strategies.   Kathleen Acevedo has agreed to follow up with our clinic in 3 weeks. She was informed of the importance of frequent follow up visits to maximize her success with intensive lifestyle modifications for her multiple health conditions.   OBESITY BEHAVIORAL INTERVENTION VISIT  Today's visit was # 10   Starting weight: 212 lbs Starting  date: 03/27/18 Today's weight : Weight: 193 lb (87.5 kg)  Today's date: 09/17/2018 Total lbs lost to date: 23  ASK: We discussed the diagnosis of obesity with Kathleen Acevedo today and Kathleen Acevedo agreed to give [place] permission to discuss obesity behavioral modification therapy today.  ASSESS: Kathleen Acevedo has the diagnosis of obesity and her BMI today is 29.35. Kathleen Acevedo is in the  action stage of change.   ADVISE: Kathleen Acevedo was educated on the multiple health risks of obesity as well as the benefit of weight loss to improve her health. She was advised of the need for long term treatment and the importance of lifestyle modifications to improve her current health and to decrease her risk of future health problems.  AGREE: Multiple dietary modification options and treatment options were discussed and Kathleen Acevedo agreed to follow the recommendations documented in the above note.  ARRANGE: Kathleen Acevedo was educated on the importance of frequent visits to treat obesity as outlined per CMS and USPSTF guidelines and agreed to schedule her next follow up appointment today.  Lenward Chancellor, am acting as transcriptionist for Abby Potash, PA-C I, Abby Potash, PA-C have reviewed above note and agree with its content

## 2018-10-15 ENCOUNTER — Encounter (INDEPENDENT_AMBULATORY_CARE_PROVIDER_SITE_OTHER): Payer: Self-pay | Admitting: Physician Assistant

## 2018-10-15 ENCOUNTER — Ambulatory Visit (INDEPENDENT_AMBULATORY_CARE_PROVIDER_SITE_OTHER): Payer: 59 | Admitting: Physician Assistant

## 2018-10-15 VITALS — BP 124/67 | HR 65 | Temp 98.1°F | Ht 68.0 in | Wt 198.0 lb

## 2018-10-15 DIAGNOSIS — I10 Essential (primary) hypertension: Secondary | ICD-10-CM | POA: Diagnosis not present

## 2018-10-15 DIAGNOSIS — Z9189 Other specified personal risk factors, not elsewhere classified: Secondary | ICD-10-CM

## 2018-10-15 DIAGNOSIS — E7849 Other hyperlipidemia: Secondary | ICD-10-CM

## 2018-10-15 DIAGNOSIS — Z6830 Body mass index (BMI) 30.0-30.9, adult: Secondary | ICD-10-CM

## 2018-10-15 DIAGNOSIS — E669 Obesity, unspecified: Secondary | ICD-10-CM | POA: Diagnosis not present

## 2018-10-15 MED ORDER — LISINOPRIL-HYDROCHLOROTHIAZIDE 10-12.5 MG PO TABS: 1.0000 | ORAL_TABLET | Freq: Every day | ORAL | 0 refills | Status: DC

## 2018-10-16 NOTE — Progress Notes (Signed)
Office: [phone]  /  Fax: [phone]   HPI:   Chief Complaint: OBESITY Kathleen Acevedo is here to discuss her progress with her obesity treatment plan. She is keeping a food journal with 1200 calories and 90 grams of protein daily and is following her eating plan approximately 70% of the time. She states she is exercising at the gym 60 minutes 5 times per week. Anayansi struggled to follow the plan at multiple holiday celebrations and luncheons. She is ready to get back on track.  Her weight is 198 lb (89.8 kg) today and has not lost weight since her last visit. She has lost 14 lbs since starting treatment with [place].  Hypertension Kathleen Acevedo is a 57 y.o. female with hypertension.  Kathleen Acevedo's blood pressure is normal and she denies chest pain.  She is working weight loss to help control her blood pressure with the goal of decreasing her risk of heart attack and stroke. Kathleen Acevedo's blood pressure is currently controlled.  Hyperlipidemia [name] has hyperlipidemia and has been trying to improve her cholesterol levels with intensive lifestyle modification including a low saturated fat diet, exercise and weight loss. [name] denies any chest pain, claudication or myalgias. Her last HLD level was at goal.  At risk for cardiovascular disease Kathleen Acevedo is at a higher than average risk for cardiovascular disease due to obesity. She currently denies any chest pain.  ASSESSMENT AND PLAN:  Essential hypertension - Plan: lisinopril-hydrochlorothiazide (PRINZIDE,ZESTORETIC) 10-12.5 MG tablet  Other hyperlipidemia  At risk for heart disease  Class 1 obesity with serious comorbidity and body mass index (BMI) of 30.0 to 30.9 in adult, unspecified obesity type  PLAN:  Hypertension We discussed sodium restriction, working on healthy weight loss, and a regular exercise program as the means to achieve improved blood pressure control. [name] agreed with this plan and agreed to follow up as directed. We will continue  to monitor her blood pressure as well as her progress with the above lifestyle modifications. Kathleen Acevedo agrees to continue taking lisinopril-hydrochlorothiazide 10-12.5 mg qd #30 and we will refill for 1 month. She will watch for signs of hypotension as she continues her lifestyle modifications. Kathleen Acevedo agrees to follow up with our clinic in 3 weeks.  Hyperlipidemia Kathleen Acevedo was informed of the American Heart Association Guidelines emphasizing intensive lifestyle modifications as the first line treatment for hyperlipidemia. We discussed many lifestyle modifications today in depth, and Kathleen Acevedo will continue to work on decreasing saturated fats such as fatty red meat, butter and many fried foods. She will also increase vegetables and lean protein in her diet and continue to work on exercise and weight loss efforts.  Cardiovascular risk counseling Kathleen Acevedo was given extended (15 minutes) coronary artery disease prevention counseling today. She is 57 y.o. female and has risk factors for heart disease including obesity. We discussed intensive lifestyle modifications today with an emphasis on specific weight loss instructions and strategies. Pt was also informed of the importance of increasing exercise and decreasing saturated fats to help prevent heart disease.  Obesity Kathleen Acevedo is currently in the action stage of change. As such, her goal is to continue with weight loss efforts She has agreed to keep a food journal with 1200 calories and 90 grams of protein daily. Kathleen Acevedo has been instructed to work up to a goal of 150 minutes of combined cardio and strengthening exercise per week for weight loss and overall health benefits. We discussed the following Behavioral Modification Strategies today: work on meal planning and easy cooking plans  and holiday eating strategies   Kathleen Acevedo has agreed to follow up with our clinic in 3 weeks. She was informed of the importance of frequent follow up visits to maximize her success with  intensive lifestyle modifications for her multiple health conditions.  ALLERGIES: No Known Allergies  MEDICATIONS: Current Outpatient Medications on File Prior to Visit  Medication Sig Dispense Refill  . empagliflozin (JARDIANCE) 25 MG TABS tablet Take 25 mg by mouth daily. 30 tablet 0  . ferrous sulfate 325 (65 FE) MG tablet Take 1 tablet (325 mg total) by mouth 2 (two) times daily.    Kathleen Acevedo glucose blood (ONE TOUCH ULTRA TEST) test strip Blood sugars to be checked 2 times daily 100 each 1  . Insulin Glargine (BASAGLAR KWIKPEN) 100 UNIT/ML SOPN Inject 10 Units into the skin every morning.     . metFORMIN (GLUCOPHAGE) 1000 MG tablet Take 1 tablet (1,000 mg total) by mouth 2 (two) times daily. 60 tablet 0  . ONETOUCH DELICA LANCETS MISC by Does not apply route 2 (two) times daily.      . Vitamin D, Ergocalciferol, (DRISDOL) 1.25 MG (50000 UT) CAPS capsule Take 1 capsule (50,000 Units total) by mouth every 7 (seven) days. 4 capsule 0   No current facility-administered medications on file prior to visit.     PAST MEDICAL HISTORY: Past Medical History:  Diagnosis Date  . Diabetes mellitus   . Hyperlipidemia   . Hypertension     PAST SURGICAL HISTORY: Past Surgical History:  Procedure Laterality Date  . ABDOMINAL HYSTERECTOMY    . BREAST BIOPSY Left   . OOPHORECTOMY      SOCIAL HISTORY: Social History   Tobacco Use  . Smoking status: Never Smoker  . Smokeless tobacco: Never Used  Substance Use Topics  . Alcohol use: No    Alcohol/week: 0.0 standard drinks  . Drug use: No    FAMILY HISTORY: Family History  Problem Relation Age of Onset  . Diabetes Father   . High blood pressure Father   . Obesity Father   . Colon cancer Neg Hx   . Colon polyps Neg Hx     ROS: Review of Systems  Constitutional: Negative for weight loss.  Cardiovascular: Negative for chest pain.  Gastrointestinal: Negative for diarrhea, nausea and vomiting.  Genitourinary: Negative for frequency.        Negative for muscle weakness  Endo/Heme/Allergies:       Negative for hypoglycemia Negative for polyphagia    PHYSICAL EXAM: Blood pressure 124/67, pulse 65, temperature 98.1 F (36.7 C), temperature source Oral, height 5\' 8"  (1.727 m), weight 198 lb (89.8 kg), SpO2 96 %. Body mass index is 30.11 kg/m. Physical Exam Constitutional:      Appearance: Normal appearance. She is obese.  Cardiovascular:     Rate and Rhythm: Normal rate.     Pulses: Normal pulses.  Pulmonary:     Effort: Pulmonary effort is normal.  Musculoskeletal: Normal range of motion.  Skin:    General: Skin is warm and dry.  Neurological:     Mental Status: She is alert and oriented to person, place, and time.  Psychiatric:        Mood and Affect: Mood normal.     RECENT LABS AND TESTS: BMET    Component Value Date/Time   NA 140 08/23/2018 0922   K 4.2 08/23/2018 0922   CL 101 08/23/2018 0922   CO2 24 08/23/2018 0922   GLUCOSE 60 (L) 08/23/2018 2440  GLUCOSE 186 (H) 01/29/2018 0725   BUN 26 (H) 08/23/2018 0922   CREATININE 1.07 (H) 08/23/2018 0922   CREATININE 0.89 09/13/2013 1546   CALCIUM 10.0 08/23/2018 0922   GFRNONAA 57 (L) 08/23/2018 0922   GFRAA 66 08/23/2018 0922   Lab Results  Component Value Date   HGBA1C 6.5 (H) 08/23/2018   HGBA1C 8.5 (H) 03/27/2018   HGBA1C 7.5 09/01/2017   HGBA1C 6.2 05/04/2016   HGBA1C 9.1 (A) 09/21/2015   No results found for: INSULIN CBC    Component Value Date/Time   WBC 4.6 01/29/2018 0725   RBC 3.99 01/29/2018 0725   HGB 11.8 (L) 01/29/2018 0725   HCT 36.1 01/29/2018 0725   PLT 222.0 01/29/2018 0725   MCV 90.4 01/29/2018 0725   MCH 29.2 09/13/2013 1546   MCHC 32.7 01/29/2018 0725   RDW 14.3 01/29/2018 0725   LYMPHSABS 1.4 01/29/2018 0725   MONOABS 0.4 01/29/2018 0725   EOSABS 0.2 01/29/2018 0725   BASOSABS 0.0 01/29/2018 0725   Iron/TIBC/Ferritin/ %Sat    Component Value Date/Time   IRON 54 01/29/2018 0725   FERRITIN 15.1 01/29/2018  0725   IRONPCTSAT 15.0 (L) 01/29/2018 0725   Lipid Panel     Component Value Date/Time   CHOL 223 (H) 08/23/2018 0922   TRIG 90 08/23/2018 0922   HDL 58 08/23/2018 0922   CHOLHDL 4 01/29/2018 0725   VLDL 23.6 01/29/2018 0725   LDLCALC 147 (H) 08/23/2018 0922   LDLDIRECT 103.6 08/23/2012 1553   Hepatic Function Panel     Component Value Date/Time   PROT 7.1 08/23/2018 0922   ALBUMIN 4.6 08/23/2018 0922   AST 24 08/23/2018 0922   ALT 19 08/23/2018 0922   ALKPHOS 66 08/23/2018 0922   BILITOT 0.3 08/23/2018 0922   BILIDIR 0.1 12/15/2017 0912   IBILI 0.2 09/13/2013 1546      Component Value Date/Time   TSH 0.902 03/27/2018 1158   TSH 1.67 12/15/2017 0912   TSH 1.42 02/19/2016 1109      OBESITY BEHAVIORAL INTERVENTION VISIT  Today's visit was # 10   Starting weight: 212 lbs Starting date: 03/27/18 Today's weight : 198 lbs Today's date: 10/16/2018 Total lbs lost to date: 14   ASK: We discussed the diagnosis of obesity with Germaine Pomfret today and Olivette agreed to give [place] permission to discuss obesity behavioral modification therapy today.  ASSESS: Omara has the diagnosis of obesity and her BMI today is 30.11 Nakiyah is in the action stage of change   ADVISE: Sunny was educated on the multiple health risks of obesity as well as the benefit of weight loss to improve her health. She was advised of the need for long term treatment and the importance of lifestyle modifications to improve her current health and to decrease her risk of future health problems.  AGREE: Multiple dietary modification options and treatment options were discussed and  Hargun agreed to follow the recommendations documented in the above note.  ARRANGE: Airiel was educated on the importance of frequent visits to treat obesity as outlined per CMS and USPSTF guidelines and agreed to schedule her next follow up appointment today.  I, Tammy Wysor, am acting as Location manager for [employer],  PA-C I, Abby Potash, PA-C have reviewed above note and agree with its content

## 2018-10-24 ENCOUNTER — Other Ambulatory Visit (INDEPENDENT_AMBULATORY_CARE_PROVIDER_SITE_OTHER): Payer: Self-pay | Admitting: Physician Assistant

## 2018-10-24 DIAGNOSIS — E559 Vitamin D deficiency, unspecified: Secondary | ICD-10-CM

## 2018-10-26 ENCOUNTER — Other Ambulatory Visit: Payer: Self-pay | Admitting: Family Medicine

## 2018-10-26 DIAGNOSIS — E1165 Type 2 diabetes mellitus with hyperglycemia: Principal | ICD-10-CM

## 2018-10-26 DIAGNOSIS — IMO0002 Reserved for concepts with insufficient information to code with codable children: Secondary | ICD-10-CM

## 2018-10-26 DIAGNOSIS — E1151 Type 2 diabetes mellitus with diabetic peripheral angiopathy without gangrene: Secondary | ICD-10-CM

## 2018-10-26 NOTE — Telephone Encounter (Signed)
Copied from [place] [id number]. Topic: Quick Communication - Rx Refill/Question >> Oct 26, 2018  3:24 PM Sheran Luz wrote: Medication: metFORMIN (GLUCOPHAGE) 1000 MG tablet  Patient is requesting a refill of this medication.   Preferred Pharmacy (with phone number or street name):[facility] [phone] (Phone) [phone] (Fax)

## 2018-10-29 MED ORDER — METFORMIN HCL 1000 MG PO TABS: 1000.0000 mg | ORAL_TABLET | Freq: Two times a day (BID) | ORAL | 0 refills | Status: DC

## 2018-10-29 NOTE — Telephone Encounter (Signed)
Left message for pt. To call and schedule an office visit.

## 2018-11-05 ENCOUNTER — Encounter: Payer: Self-pay | Admitting: Family Medicine

## 2018-11-05 ENCOUNTER — Ambulatory Visit: Payer: 59 | Admitting: Family Medicine

## 2018-11-05 ENCOUNTER — Ambulatory Visit (INDEPENDENT_AMBULATORY_CARE_PROVIDER_SITE_OTHER): Payer: 59 | Admitting: Physician Assistant

## 2018-11-05 VITALS — BP 120/72 | HR 55 | Temp 98.1°F | Resp 16 | Ht 68.0 in | Wt 204.2 lb

## 2018-11-05 DIAGNOSIS — IMO0002 Reserved for concepts with insufficient information to code with codable children: Secondary | ICD-10-CM

## 2018-11-05 DIAGNOSIS — E1165 Type 2 diabetes mellitus with hyperglycemia: Secondary | ICD-10-CM

## 2018-11-05 DIAGNOSIS — E1169 Type 2 diabetes mellitus with other specified complication: Secondary | ICD-10-CM | POA: Diagnosis not present

## 2018-11-05 DIAGNOSIS — I10 Essential (primary) hypertension: Secondary | ICD-10-CM | POA: Diagnosis not present

## 2018-11-05 DIAGNOSIS — E785 Hyperlipidemia, unspecified: Secondary | ICD-10-CM

## 2018-11-05 DIAGNOSIS — E119 Type 2 diabetes mellitus without complications: Secondary | ICD-10-CM | POA: Insufficient documentation

## 2018-11-05 DIAGNOSIS — E1151 Type 2 diabetes mellitus with diabetic peripheral angiopathy without gangrene: Secondary | ICD-10-CM | POA: Diagnosis not present

## 2018-11-05 LAB — COMPREHENSIVE METABOLIC PANEL
ALT: 12 U/L (ref 0–35)
AST: 10 U/L (ref 0–37)
Albumin: 3.9 g/dL (ref 3.5–5.2)
Alkaline Phosphatase: 62 U/L (ref 39–117)
BILIRUBIN TOTAL: 0.3 mg/dL (ref 0.2–1.2)
BUN: 22 mg/dL (ref 6–23)
CO2: 29 mEq/L (ref 19–32)
Calcium: 9.6 mg/dL (ref 8.4–10.5)
Chloride: 104 mEq/L (ref 96–112)
Creatinine, Ser: 0.92 mg/dL (ref 0.40–1.20)
GFR: 80.28 mL/min (ref >60.00)
Glucose, Bld: 69 mg/dL — ABNORMAL LOW (ref 70–99)
Potassium: 4.2 mEq/L (ref 3.5–5.1)
Sodium: 139 mEq/L (ref 135–145)
Total Protein: 6.5 g/dL (ref 6.0–8.3)

## 2018-11-05 LAB — LIPID PANEL
CHOL/HDL RATIO: 4
Cholesterol: 215 mg/dL — ABNORMAL HIGH (ref 0–200)
HDL: 53.9 mg/dL (ref >39.00)
LDL CALC: 141 mg/dL — AB (ref 0–99)
NONHDL: 160.93
TRIGLYCERIDES: 100 mg/dL (ref 0.0–149.0)
VLDL: 20 mg/dL (ref 0.0–40.0)

## 2018-11-05 LAB — HEMOGLOBIN A1C: Hgb A1c MFr Bld: 7.2 % — ABNORMAL HIGH (ref 4.6–6.5)

## 2018-11-05 MED ORDER — LISINOPRIL-HYDROCHLOROTHIAZIDE 10-12.5 MG PO TABS: 1.0000 | ORAL_TABLET | Freq: Every day | ORAL | 5 refills | Status: DC

## 2018-11-05 MED ORDER — GLUCOSE BLOOD VI STRP: ORAL_STRIP | 1 refills | Status: DC

## 2018-11-05 MED ORDER — ONETOUCH DELICA LANCETS 30G MISC: 1.0000 | Freq: Four times a day (QID) | 6 refills | Status: DC

## 2018-11-05 MED ORDER — LISINOPRIL-HYDROCHLOROTHIAZIDE 10-12.5 MG PO TABS: 1.0000 | ORAL_TABLET | Freq: Every day | ORAL | 1 refills | Status: DC

## 2018-11-05 MED ORDER — METFORMIN HCL 1000 MG PO TABS: 1000.0000 mg | ORAL_TABLET | Freq: Two times a day (BID) | ORAL | 1 refills | Status: DC

## 2018-11-05 NOTE — Assessment & Plan Note (Signed)
con't with healthy weight and wellness

## 2018-11-05 NOTE — Patient Instructions (Signed)

## 2018-11-05 NOTE — Progress Notes (Signed)
Patient ID: Kathleen Acevedo, female    DOB: [birth year]  Age: 58 y.o. MRN: [id number]    Subjective:  Subjective  HPI  Kathleen Acevedo presents for f/u dm, chol and bp.  Pt is doing well with healthy weight and wellness program.    Review of Systems  Constitutional: Negative for chills and fever.  HENT: Negative for congestion and hearing loss.   Eyes: Negative for discharge.  Respiratory: Negative for cough and shortness of breath.   Cardiovascular: Negative for chest pain, palpitations and leg swelling.  Gastrointestinal: Negative for abdominal pain, blood in stool, constipation, diarrhea, nausea and vomiting.  Genitourinary: Negative for dysuria, frequency, hematuria and urgency.  Musculoskeletal: Negative for back pain and myalgias.  Skin: Negative for rash.  Allergic/Immunologic: Negative for environmental allergies.  Neurological: Negative for dizziness, weakness and headaches.  Hematological: Does not bruise/bleed easily.  Psychiatric/Behavioral: Negative for suicidal ideas. The patient is not nervous/anxious.     History Past Medical History:  Diagnosis Date  . Diabetes mellitus   . Hyperlipidemia   . Hypertension     She has a past surgical history that includes Abdominal hysterectomy; Oophorectomy; and Breast biopsy (Left).   Her family history includes Diabetes in her father; High blood pressure in her father; Obesity in her father.She reports that she has never smoked. She has never used smokeless tobacco. She reports that she does not drink alcohol or use drugs.  Current Outpatient Medications on File Prior to Visit  Medication Sig Dispense Refill  . empagliflozin (JARDIANCE) 25 MG TABS tablet Take 25 mg by mouth daily. 30 tablet 0  . ferrous sulfate 325 (65 FE) MG tablet Take 1 tablet (325 mg total) by mouth 2 (two) times daily.    . Insulin Glargine (BASAGLAR KWIKPEN) 100 UNIT/ML SOPN Inject 10 Units into the skin every morning.     . Vitamin D, Ergocalciferol,  (DRISDOL) 1.25 MG (50000 UT) CAPS capsule TAKE 1 CAPSULE BY MOUTH EVERY 7 DAYS 4 capsule 0   No current facility-administered medications on file prior to visit.      Objective:  Objective  Physical Exam Vitals signs and nursing note reviewed.  Constitutional:      Appearance: She is well-developed.  HENT:     Head: Normocephalic and atraumatic.  Eyes:     Conjunctiva/sclera: Conjunctivae normal.  Neck:     Musculoskeletal: Normal range of motion and neck supple.     Thyroid: No thyromegaly.     Vascular: No carotid bruit or JVD.  Cardiovascular:     Rate and Rhythm: Normal rate and regular rhythm.     Heart sounds: Normal heart sounds. No murmur.  Pulmonary:     Effort: Pulmonary effort is normal. No respiratory distress.     Breath sounds: Normal breath sounds. No wheezing or rales.  Chest:     Chest wall: No tenderness.  Neurological:     Mental Status: She is alert and oriented to person, place, and time.    BP 120/72 (BP Location: Right Arm, Cuff Size: Normal)   Pulse (!) 55   Temp 98.1 F (36.7 C) (Oral)   Resp 16   Ht 5\' 8"  (1.727 m)   Wt 204 lb 3.2 oz (92.6 kg)   SpO2 95%   BMI 31.05 kg/m  Wt Readings from Last 3 Encounters:  11/05/18 204 lb 3.2 oz (92.6 kg)  10/15/18 198 lb (89.8 kg)  09/17/18 193 lb (87.5 kg)     Lab Results  Component Value Date   WBC 4.6 01/29/2018   HGB 11.8 (L) 01/29/2018   HCT 36.1 01/29/2018   PLT 222.0 01/29/2018   GLUCOSE 60 (L) 08/23/2018   CHOL 223 (H) 08/23/2018   TRIG 90 08/23/2018   HDL 58 08/23/2018   LDLDIRECT 103.6 08/23/2012   LDLCALC 147 (H) 08/23/2018   ALT 19 08/23/2018   AST 24 08/23/2018   NA 140 08/23/2018   K 4.2 08/23/2018   CL 101 08/23/2018   CREATININE 1.07 (H) 08/23/2018   BUN 26 (H) 08/23/2018   CO2 24 08/23/2018   TSH 0.902 03/27/2018   HGBA1C 6.5 (H) 08/23/2018   MICROALBUR 1.2 07/02/2010    Mm 3d Screen Breast Bilateral  Result Date: 08/07/2018 CLINICAL DATA:  Screening. EXAM:  DIGITAL SCREENING BILATERAL MAMMOGRAM WITH TOMO AND CAD COMPARISON:  Previous exam(s). ACR Breast Density Category b: There are scattered areas of fibroglandular density. FINDINGS: There are no findings suspicious for malignancy. Images were processed with CAD. IMPRESSION: No mammographic evidence of malignancy. A result letter of this screening mammogram will be mailed directly to the patient. RECOMMENDATION: Screening mammogram in one year. (Code:SM-B-01Y) BI-RADS CATEGORY  1: Negative. Electronically Signed   By: Claudie Revering M.D.   On: 08/07/2018 12:54     Assessment & Plan:  Plan  I have discontinued [place] LANCETS. I am also having her start on ONETOUCH DELICA LANCETS 95M. Additionally, I am having her maintain her [facility], ferrous sulfate, empagliflozin, Vitamin D (Ergocalciferol), metFORMIN, glucose blood, and lisinopril-hydrochlorothiazide.  Meds ordered this encounter  Medications  . DISCONTD: lisinopril-hydrochlorothiazide (PRINZIDE,ZESTORETIC) 10-12.5 MG tablet    Sig: Take 1 tablet by mouth daily.    Dispense:  30 tablet    Refill:  5  . metFORMIN (GLUCOPHAGE) 1000 MG tablet    Sig: Take 1 tablet (1,000 mg total) by mouth 2 (two) times daily.    Dispense:  180 tablet    Refill:  1  . glucose blood (ONE TOUCH ULTRA TEST) test strip    Sig: Blood sugars to be checked 2 times daily    Dispense:  200 each    Refill:  1  . lisinopril-hydrochlorothiazide (PRINZIDE,ZESTORETIC) 10-12.5 MG tablet    Sig: Take 1 tablet by mouth daily.    Dispense:  90 tablet    Refill:  1  . ONETOUCH DELICA LANCETS 84X MISC    Sig: 1 Stick by Does not apply route 4 (four) times daily.    Dispense:  100 each    Refill:  6    Problem List Items Addressed This Visit      Unprioritized   Diabetes type 2, uncontrolled ([place])    Check labs  hgba1c to be checked, minimize simple carbs. Increase exercise as tolerated. Continue current meds       Relevant  Medications   metFORMIN (GLUCOPHAGE) 1000 MG tablet   lisinopril-hydrochlorothiazide (PRINZIDE,ZESTORETIC) 10-12.5 MG tablet   DM (diabetes mellitus) type II uncontrolled, periph vascular disorder (HCC)   Relevant Medications   metFORMIN (GLUCOPHAGE) 1000 MG tablet   glucose blood (ONE TOUCH ULTRA TEST) test strip   lisinopril-hydrochlorothiazide (PRINZIDE,ZESTORETIC) 10-12.5 MG tablet   ONETOUCH DELICA LANCETS 32G MISC   Other Relevant Orders   Hemoglobin A1c   Essential hypertension    Well controlled, no changes to meds. Encouraged heart healthy diet such as the DASH diet and exercise as tolerated.       Relevant Medications   lisinopril-hydrochlorothiazide (PRINZIDE,ZESTORETIC) 10-12.5  MG tablet   Other Relevant Orders   Lipid panel   Comprehensive metabolic panel   Hyperlipidemia    Tolerating statin, encouraged heart healthy diet, avoid trans fats, minimize simple carbs and saturated fats. Increase exercise as tolerated      Relevant Medications   lisinopril-hydrochlorothiazide (PRINZIDE,ZESTORETIC) 10-12.5 MG tablet   Hyperlipidemia associated with type 2 diabetes mellitus ([place]) - Primary   Relevant Medications   metFORMIN (GLUCOPHAGE) 1000 MG tablet   lisinopril-hydrochlorothiazide (PRINZIDE,ZESTORETIC) 10-12.5 MG tablet   Other Relevant Orders   Lipid panel   Comprehensive metabolic panel   Morbid obesity ([place])    con't with healthy weight and wellness       Relevant Medications   metFORMIN (GLUCOPHAGE) 1000 MG tablet      Follow-up: Return in about 6 months (around 05/06/2019) for annual exam, fasting.  Ann Held, DO

## 2018-11-05 NOTE — Assessment & Plan Note (Signed)
Check labs  hgba1c to be checked, minimize simple carbs. Increase exercise as tolerated. Continue current meds  

## 2018-11-05 NOTE — Assessment & Plan Note (Signed)
Well controlled, no changes to meds. Encouraged heart healthy diet such as the DASH diet and exercise as tolerated.  °

## 2018-11-05 NOTE — Assessment & Plan Note (Signed)
Tolerating statin, encouraged heart healthy diet, avoid trans fats, minimize simple carbs and saturated fats. Increase exercise as tolerated 

## 2018-11-06 MED ORDER — EMPAGLIFLOZIN 25 MG PO TABS: 25.0000 mg | ORAL_TABLET | Freq: Every day | ORAL | 0 refills | Status: DC

## 2018-11-06 NOTE — Addendum Note (Signed)
Addended by: Magdalene Molly A on: 11/06/2018 03:31 PM   Modules accepted: Orders

## 2018-11-10 ENCOUNTER — Other Ambulatory Visit: Payer: Self-pay | Admitting: Family Medicine

## 2018-11-10 DIAGNOSIS — E1169 Type 2 diabetes mellitus with other specified complication: Secondary | ICD-10-CM

## 2018-11-10 DIAGNOSIS — E1165 Type 2 diabetes mellitus with hyperglycemia: Secondary | ICD-10-CM

## 2018-11-10 DIAGNOSIS — E785 Hyperlipidemia, unspecified: Secondary | ICD-10-CM

## 2018-11-13 ENCOUNTER — Ambulatory Visit (INDEPENDENT_AMBULATORY_CARE_PROVIDER_SITE_OTHER): Payer: 59 | Admitting: Physician Assistant

## 2018-11-20 ENCOUNTER — Ambulatory Visit (INDEPENDENT_AMBULATORY_CARE_PROVIDER_SITE_OTHER): Payer: 59 | Admitting: Physician Assistant

## 2018-11-26 ENCOUNTER — Encounter (INDEPENDENT_AMBULATORY_CARE_PROVIDER_SITE_OTHER): Payer: Self-pay | Admitting: Physician Assistant

## 2018-11-26 ENCOUNTER — Ambulatory Visit (INDEPENDENT_AMBULATORY_CARE_PROVIDER_SITE_OTHER): Payer: 59 | Admitting: Physician Assistant

## 2018-11-26 VITALS — BP 129/71 | HR 62 | Temp 98.1°F | Ht 68.0 in | Wt 201.0 lb

## 2018-11-26 DIAGNOSIS — Z794 Long term (current) use of insulin: Secondary | ICD-10-CM

## 2018-11-26 DIAGNOSIS — E669 Obesity, unspecified: Secondary | ICD-10-CM | POA: Diagnosis not present

## 2018-11-26 DIAGNOSIS — E119 Type 2 diabetes mellitus without complications: Secondary | ICD-10-CM | POA: Diagnosis not present

## 2018-11-26 DIAGNOSIS — Z6830 Body mass index (BMI) 30.0-30.9, adult: Secondary | ICD-10-CM | POA: Diagnosis not present

## 2018-11-26 NOTE — Progress Notes (Signed)
Office: [phone]  /  Fax: [phone]   HPI:   Chief Complaint: OBESITY Kathleen Acevedo is here to discuss her progress with her obesity treatment plan. She is on the  keep a food journal with 1200 calories and 90 grams of  protein  and is following her eating plan approximately 40 % of the time. She states she is strength training and walking 60 minutes 5 times per week. Kathleen Acevedo reports that she got off track during the holidays. She is ready to get back on track. Her weight is 201 lb (91.2 kg) today and has gained 3 lbs since her last visit. She has lost 11 lbs since starting treatment with [place].  Diabetes II Kathleen Acevedo has a diagnosis of diabetes type II. Kathleen Acevedo states fasting BGs range between 120 and 150 and denies Polyphagia or any hypoglycemic episodes. She is taking insulin and metformin. She denies any nausea, vomiting or diarrhea. Last A1c was Hemoglobin A1C Latest Ref Rng & Units 11/05/2018 08/23/2018 03/27/2018  HGBA1C 4.6 - 6.5 % 7.2(H) 6.5(H) 8.5(H)  Some recent data might be hidden    She has been working on intensive lifestyle modifications including diet, exercise, and weight loss to help control her blood glucose levels.   ASSESSMENT AND PLAN:   Type 2 diabetes mellitus without complication, with long-term current use of insulin (HCC)  Class 1 obesity with serious comorbidity and body mass index (BMI) of 30.0 to 30.9 in adult, unspecified obesity type  PLAN:  Diabetes II Kathleen Acevedo has been given extensive diabetes education by myself today including ideal fasting and post-prandial blood glucose readings, individual ideal Hgb A1c goals  and hypoglycemia prevention. We discussed the importance of good blood sugar control to decrease the likelihood of diabetic complications such as nephropathy, neuropathy, limb loss, blindness, coronary artery disease, and death. We discussed the importance of intensive lifestyle modification including diet, exercise and weight loss as the first line  treatment for diabetes. Kathleen Acevedo agrees to continue taking her diabetes medications and will follow up with our clinic in 2 weeks.  I spent > than 50% of the 15 minute visit on counseling as documented in the note.  Obesity Kathleen Acevedo is currently in the action stage of change. As such, her goal is to continue with weight loss efforts She has agreed to keep a food journal with 1200 calories and 90 grams of protein daily Kathleen Acevedo has been instructed to work up to a goal of 150 minutes of combined cardio and strengthening exercise per week for weight loss and overall health benefits. We discussed the following Behavioral Modification Strategies today: increasing lean protein intake and work on meal planning and easy cooking plans  Kathleen Acevedo has agreed to follow up with our clinic in 2 weeks. She was informed of the importance of frequent follow up visits to maximize her success with intensive lifestyle modifications for her multiple health conditions.  ALLERGIES: No Known Allergies  MEDICATIONS: Current Outpatient Medications on File Prior to Visit  Medication Sig Dispense Refill  . empagliflozin (JARDIANCE) 25 MG TABS tablet Take 25 mg by mouth daily. 30 tablet 0  . ferrous sulfate 325 (65 FE) MG tablet Take 1 tablet (325 mg total) by mouth 2 (two) times daily.    [name] glucose blood (ONE TOUCH ULTRA TEST) test strip Blood sugars to be checked 2 times daily 200 each 1  . Insulin Glargine (BASAGLAR KWIKPEN) 100 UNIT/ML SOPN Inject 10 Units into the skin every morning.     [name] lisinopril-hydrochlorothiazide (PRINZIDE,ZESTORETIC)  10-12.5 MG tablet Take 1 tablet by mouth daily. 90 tablet 1  . metFORMIN (GLUCOPHAGE) 1000 MG tablet Take 1 tablet (1,000 mg total) by mouth 2 (two) times daily. 180 tablet 1  . ONETOUCH DELICA LANCETS 40H MISC 1 Stick by Does not apply route 4 (four) times daily. 100 each 6  . Vitamin D, Ergocalciferol, (DRISDOL) 1.25 MG (50000 UT) CAPS capsule TAKE 1 CAPSULE BY MOUTH EVERY 7 DAYS 4  capsule 0   No current facility-administered medications on file prior to visit.     PAST MEDICAL HISTORY: Past Medical History:  Diagnosis Date  . Diabetes mellitus   . Hyperlipidemia   . Hypertension     PAST SURGICAL HISTORY: Past Surgical History:  Procedure Laterality Date  . ABDOMINAL HYSTERECTOMY    . BREAST BIOPSY Left   . OOPHORECTOMY      SOCIAL HISTORY: Social History   Tobacco Use  . Smoking status: Never Smoker  . Smokeless tobacco: Never Used  Substance Use Topics  . Alcohol use: No    Alcohol/week: 0.0 standard drinks  . Drug use: No    FAMILY HISTORY: Family History  Problem Relation Age of Onset  . Diabetes Father   . High blood pressure Father   . Obesity Father   . Colon cancer Neg Hx   . Colon polyps Neg Hx     ROS: Review of Systems  Constitutional: Negative for weight loss.  Gastrointestinal: Negative for diarrhea, nausea and vomiting.  Endo/Heme/Allergies:       Negative for hypoglycemia Negative for polyphagia    PHYSICAL EXAM: Blood pressure 129/71, pulse 62, temperature 98.1 F (36.7 C), temperature source Oral, height 5\' 8"  (1.727 m), weight 201 lb (91.2 kg), SpO2 100 %. Body mass index is 30.56 kg/m. Physical Exam Vitals signs reviewed.  Constitutional:      Appearance: Normal appearance. She is obese.  Cardiovascular:     Rate and Rhythm: Normal rate.     Pulses: Normal pulses.  Pulmonary:     Effort: Pulmonary effort is normal.  Musculoskeletal: Normal range of motion.  Skin:    General: Skin is warm and dry.  Neurological:     Mental Status: She is alert and oriented to person, place, and time.  Psychiatric:        Mood and Affect: Mood normal.        Behavior: Behavior normal.     RECENT LABS AND TESTS: BMET    Component Value Date/Time   NA 139 11/05/2018 0851   NA 140 08/23/2018 0922   K 4.2 11/05/2018 0851   CL 104 11/05/2018 0851   CO2 29 11/05/2018 0851   GLUCOSE 69 (L) 11/05/2018 0851   BUN 22  11/05/2018 0851   BUN 26 (H) 08/23/2018 0922   CREATININE 0.92 11/05/2018 0851   CREATININE 0.89 09/13/2013 1546   CALCIUM 9.6 11/05/2018 0851   GFRNONAA 57 (L) 08/23/2018 0922   GFRAA 66 08/23/2018 0922   Lab Results  Component Value Date   HGBA1C 7.2 (H) 11/05/2018   HGBA1C 6.5 (H) 08/23/2018   HGBA1C 8.5 (H) 03/27/2018   HGBA1C 7.5 09/01/2017   HGBA1C 6.2 05/04/2016   No results found for: INSULIN CBC    Component Value Date/Time   WBC 4.6 01/29/2018 0725   RBC 3.99 01/29/2018 0725   HGB 11.8 (L) 01/29/2018 0725   HCT 36.1 01/29/2018 0725   PLT 222.0 01/29/2018 0725   MCV 90.4 01/29/2018 0725   MCH 29.2  09/13/2013 1546   MCHC 32.7 01/29/2018 0725   RDW 14.3 01/29/2018 0725   LYMPHSABS 1.4 01/29/2018 0725   MONOABS 0.4 01/29/2018 0725   EOSABS 0.2 01/29/2018 0725   BASOSABS 0.0 01/29/2018 0725   Iron/TIBC/Ferritin/ %Sat    Component Value Date/Time   IRON 54 01/29/2018 0725   FERRITIN 15.1 01/29/2018 0725   IRONPCTSAT 15.0 (L) 01/29/2018 0725   Lipid Panel     Component Value Date/Time   CHOL 215 (H) 11/05/2018 0851   CHOL 223 (H) 08/23/2018 0922   TRIG 100.0 11/05/2018 0851   HDL 53.90 11/05/2018 0851   HDL 58 08/23/2018 0922   CHOLHDL 4 11/05/2018 0851   VLDL 20.0 11/05/2018 0851   LDLCALC 141 (H) 11/05/2018 0851   LDLCALC 147 (H) 08/23/2018 0922   LDLDIRECT 103.6 08/23/2012 1553   Hepatic Function Panel     Component Value Date/Time   PROT 6.5 11/05/2018 0851   PROT 7.1 08/23/2018 0922   ALBUMIN 3.9 11/05/2018 0851   ALBUMIN 4.6 08/23/2018 0922   AST 10 11/05/2018 0851   ALT 12 11/05/2018 0851   ALKPHOS 62 11/05/2018 0851   BILITOT 0.3 11/05/2018 0851   BILITOT 0.3 08/23/2018 0922   BILIDIR 0.1 12/15/2017 0912   IBILI 0.2 09/13/2013 1546      Component Value Date/Time   TSH 0.902 03/27/2018 1158   TSH 1.67 12/15/2017 0912   TSH 1.42 02/19/2016 1109      OBESITY BEHAVIORAL INTERVENTION VISIT  Today's visit was # 12   Starting  weight: 212 lbs Starting date: 03/27/2018 Today's weight : 201 lbs Today's date: 11/26/2018 Total lbs lost to date: 11  ASK: We discussed the diagnosis of obesity with Kathleen Acevedo today and Kathleen Acevedo agreed to give [place] permission to discuss obesity behavioral modification therapy today.  ASSESS: Kathleen Acevedo has the diagnosis of obesity and her BMI today is 30.57 Kathleen Acevedo is in the action stage of change   ADVISE: Kathleen Acevedo was educated on the multiple health risks of obesity as well as the benefit of weight loss to improve her health. She was advised of the need for long term treatment and the importance of lifestyle modifications to improve her current health and to decrease her risk of future health problems.  AGREE: Multiple dietary modification options and treatment options were discussed and  Kathleen Acevedo agreed to follow the recommendations documented in the above note.  ARRANGE: Kathleen Acevedo was educated on the importance of frequent visits to treat obesity as outlined per CMS and USPSTF guidelines and agreed to schedule her next follow up appointment today.  I, Tammy Wysor , am acting as Location manager for [employer] I, Abby Potash, PA-C have reviewed above note and agree with its content

## 2018-12-10 ENCOUNTER — Ambulatory Visit (INDEPENDENT_AMBULATORY_CARE_PROVIDER_SITE_OTHER): Payer: 59 | Admitting: Physician Assistant

## 2018-12-10 ENCOUNTER — Encounter (INDEPENDENT_AMBULATORY_CARE_PROVIDER_SITE_OTHER): Payer: Self-pay | Admitting: Physician Assistant

## 2018-12-10 VITALS — BP 147/70 | HR 58 | Temp 97.5°F | Ht 68.0 in | Wt 200.0 lb

## 2018-12-10 DIAGNOSIS — Z6830 Body mass index (BMI) 30.0-30.9, adult: Secondary | ICD-10-CM | POA: Diagnosis not present

## 2018-12-10 DIAGNOSIS — E559 Vitamin D deficiency, unspecified: Secondary | ICD-10-CM | POA: Diagnosis not present

## 2018-12-10 DIAGNOSIS — E669 Obesity, unspecified: Secondary | ICD-10-CM | POA: Diagnosis not present

## 2018-12-10 NOTE — Progress Notes (Signed)
Office: [phone]  /  Fax: [phone]   HPI:   Chief Complaint: OBESITY Gerrica is here to discuss her progress with her obesity treatment plan. She is keeping a food journal with 1200 calories and 90 grams of protein  and is following her eating plan approximately 65% of the time. She states she is walking 30-60 minutes 5 times per week. Chriselda did well with weight loss. She reports that she is working hard to get back on track. She is more focused on feeling good than her actual weight. Her weight is 200 lb (90.7 kg) today and has had a weight loss of 1 pound over a period of 2 weeks since her last visit. She has lost 12 lbs since starting treatment with [place].  Vitamin D deficiency Raeley has a diagnosis of Vitamin D deficiency. She is currently taking prescription Vit D and denies nausea, vomiting or muscle weakness.  ASSESSMENT AND PLAN:  Vitamin D deficiency  Class 1 obesity with serious comorbidity and body mass index (BMI) of 30.0 to 30.9 in adult, unspecified obesity type  PLAN:  Vitamin D Deficiency Laynee was informed that low Vitamin D levels contributes to fatigue and are associated with obesity, breast, and colon cancer. She agrees to continue to take prescription Vit D @ 50,000 IU every week and will follow-up for routine testing of Vitamin D, at least 2-3 times per year. She was informed of the risk of over-replacement of Vitamin D and agrees to not increase her dose unless she discusses this with [place] first. Donielle agrees to follow-up with our clinic in 3-4 weeks.  I spent > than 50% of the 15 minute visit on counseling as documented in the note.  Obesity Eyonna is currently in the action stage of change. As such, her goal is to continue with weight loss efforts. She has agreed to keep a food journal with 1200 calories and 90 grams of protein daily. Dyanara has been instructed to work up to a goal of 150 minutes of combined cardio and strengthening exercise per week  for weight loss and overall health benefits. We discussed the following Behavioral Modification Strategies today: work on meal planning and easy cooking plans and keeping healthy foods in the home.  Seona has agreed to follow up with our clinic in 3-4 weeks. She was informed of the importance of frequent follow up visits to maximize her success with intensive lifestyle modifications for her multiple health conditions.  ALLERGIES: No Known Allergies  MEDICATIONS: Current Outpatient Medications on File Prior to Visit  Medication Sig Dispense Refill  . empagliflozin (JARDIANCE) 25 MG TABS tablet Take 25 mg by mouth daily. 30 tablet 0  . ferrous sulfate 325 (65 FE) MG tablet Take 1 tablet (325 mg total) by mouth 2 (two) times daily.    [name] glucose blood (ONE TOUCH ULTRA TEST) test strip Blood sugars to be checked 2 times daily 200 each 1  . Insulin Glargine (BASAGLAR KWIKPEN) 100 UNIT/ML SOPN Inject 10 Units into the skin every morning.     [name] lisinopril-hydrochlorothiazide (PRINZIDE,ZESTORETIC) 10-12.5 MG tablet Take 1 tablet by mouth daily. 90 tablet 1  . metFORMIN (GLUCOPHAGE) 1000 MG tablet Take 1 tablet (1,000 mg total) by mouth 2 (two) times daily. 180 tablet 1  . ONETOUCH DELICA LANCETS 21F MISC 1 Stick by Does not apply route 4 (four) times daily. 100 each 6  . Vitamin D, Ergocalciferol, (DRISDOL) 1.25 MG (50000 UT) CAPS capsule TAKE 1 CAPSULE BY MOUTH EVERY  7 DAYS 4 capsule 0   No current facility-administered medications on file prior to visit.     PAST MEDICAL HISTORY: Past Medical History:  Diagnosis Date  . Diabetes mellitus   . Hyperlipidemia   . Hypertension     PAST SURGICAL HISTORY: Past Surgical History:  Procedure Laterality Date  . ABDOMINAL HYSTERECTOMY    . BREAST BIOPSY Left   . OOPHORECTOMY      SOCIAL HISTORY: Social History   Tobacco Use  . Smoking status: Never Smoker  . Smokeless tobacco: Never Used  Substance Use Topics  . Alcohol use: No     Alcohol/week: 0.0 standard drinks  . Drug use: No    FAMILY HISTORY: Family History  Problem Relation Age of Onset  . Diabetes Father   . High blood pressure Father   . Obesity Father   . Colon cancer Neg Hx   . Colon polyps Neg Hx    ROS: Review of Systems  Constitutional: Positive for weight loss.  Gastrointestinal: Negative for nausea and vomiting.  Musculoskeletal:       Negative for muscle weakness.   PHYSICAL EXAM: Blood pressure (!) 147/70, pulse (!) 58, temperature (!) 97.5 F (36.4 C), temperature source Oral, height 5\' 8"  (1.727 m), weight 200 lb (90.7 kg), SpO2 96 %. Body mass index is 30.41 kg/m. Physical Exam Vitals signs reviewed.  Constitutional:      Appearance: Normal appearance. She is obese.  Cardiovascular:     Rate and Rhythm: Normal rate.     Pulses: Normal pulses.  Pulmonary:     Effort: Pulmonary effort is normal.     Breath sounds: Normal breath sounds.  Musculoskeletal: Normal range of motion.  Skin:    General: Skin is warm and dry.  Neurological:     Mental Status: She is alert and oriented to person, place, and time.  Psychiatric:        Behavior: Behavior normal.   RECENT LABS AND TESTS: BMET    Component Value Date/Time   NA 139 11/05/2018 0851   NA 140 08/23/2018 0922   K 4.2 11/05/2018 0851   CL 104 11/05/2018 0851   CO2 29 11/05/2018 0851   GLUCOSE 69 (L) 11/05/2018 0851   BUN 22 11/05/2018 0851   BUN 26 (H) 08/23/2018 0922   CREATININE 0.92 11/05/2018 0851   CREATININE 0.89 09/13/2013 1546   CALCIUM 9.6 11/05/2018 0851   GFRNONAA 57 (L) 08/23/2018 0922   GFRAA 66 08/23/2018 0922   Lab Results  Component Value Date   HGBA1C 7.2 (H) 11/05/2018   HGBA1C 6.5 (H) 08/23/2018   HGBA1C 8.5 (H) 03/27/2018   HGBA1C 7.5 09/01/2017   HGBA1C 6.2 05/04/2016   No results found for: INSULIN CBC    Component Value Date/Time   WBC 4.6 01/29/2018 0725   RBC 3.99 01/29/2018 0725   HGB 11.8 (L) 01/29/2018 0725   HCT 36.1  01/29/2018 0725   PLT 222.0 01/29/2018 0725   MCV 90.4 01/29/2018 0725   MCH 29.2 09/13/2013 1546   MCHC 32.7 01/29/2018 0725   RDW 14.3 01/29/2018 0725   LYMPHSABS 1.4 01/29/2018 0725   MONOABS 0.4 01/29/2018 0725   EOSABS 0.2 01/29/2018 0725   BASOSABS 0.0 01/29/2018 0725   Iron/TIBC/Ferritin/ %Sat    Component Value Date/Time   IRON 54 01/29/2018 0725   FERRITIN 15.1 01/29/2018 0725   IRONPCTSAT 15.0 (L) 01/29/2018 0725   Lipid Panel     Component Value Date/Time  CHOL 215 (H) 11/05/2018 0851   CHOL 223 (H) 08/23/2018 0922   TRIG 100.0 11/05/2018 0851   HDL 53.90 11/05/2018 0851   HDL 58 08/23/2018 0922   CHOLHDL 4 11/05/2018 0851   VLDL 20.0 11/05/2018 0851   LDLCALC 141 (H) 11/05/2018 0851   LDLCALC 147 (H) 08/23/2018 0922   LDLDIRECT 103.6 08/23/2012 1553   Hepatic Function Panel     Component Value Date/Time   PROT 6.5 11/05/2018 0851   PROT 7.1 08/23/2018 0922   ALBUMIN 3.9 11/05/2018 0851   ALBUMIN 4.6 08/23/2018 0922   AST 10 11/05/2018 0851   ALT 12 11/05/2018 0851   ALKPHOS 62 11/05/2018 0851   BILITOT 0.3 11/05/2018 0851   BILITOT 0.3 08/23/2018 0922   BILIDIR 0.1 12/15/2017 0912   IBILI 0.2 09/13/2013 1546      Component Value Date/Time   TSH 0.902 03/27/2018 1158   TSH 1.67 12/15/2017 0912   TSH 1.42 02/19/2016 1109   Results for Saiz, JEENA ARNETT (MRN [id number]) as of 12/10/2018 12:58  Ref. Range 08/23/2018 09:22  Vitamin D, 25-Hydroxy Latest Ref Range: 30.0 - 100.0 ng/mL 53.2   OBESITY BEHAVIORAL INTERVENTION VISIT  Today's visit was #13  Starting weight: 212 lbs Starting date: 03/27/2018 Today's weight: 200 lbs Today's date: 12/10/2018 Total lbs lost to date: 12  ASK: We discussed the diagnosis of obesity with Germaine Pomfret today and Leslieanne agreed to give [place] permission to discuss obesity behavioral modification therapy today.  ASSESS: Falesha has the diagnosis of obesity and her BMI today is @ 30.41.  Amrit is in the action stage  of change.   ADVISE: Novelle was educated on the multiple health risks of obesity as well as the benefit of weight loss to improve her health. She was advised of the need for long term treatment and the importance of lifestyle modifications to improve her current health and to decrease her risk of future health problems.  AGREE: Multiple dietary modification options and treatment options were discussed and  Shai agreed to follow the recommendations documented in the above note.  ARRANGE: Carlyne was educated on the importance of frequent visits to treat obesity as outlined per CMS and USPSTF guidelines and agreed to schedule her next follow-up appointment today.  Migdalia Dk, am acting as transcriptionist for Abby Potash, PA-C I, Abby Potash, PA-C have reviewed above note and agree with its content

## 2018-12-13 ENCOUNTER — Other Ambulatory Visit: Payer: Self-pay | Admitting: Family Medicine

## 2018-12-13 DIAGNOSIS — E119 Type 2 diabetes mellitus without complications: Secondary | ICD-10-CM

## 2018-12-13 MED ORDER — EMPAGLIFLOZIN 25 MG PO TABS: 25.0000 mg | ORAL_TABLET | Freq: Every day | ORAL | 1 refills | Status: DC

## 2018-12-13 NOTE — Telephone Encounter (Signed)
Copied from [place] [id number]. Topic: General - Other >> Dec 13, 2018 12:51 PM Lennox Solders wrote: Reason for CRM: pt is calling and she has contacted her pharm. Pt needs a refill on jardiance 25 mg #30 w/refills send to [facility] [address] in [place]

## 2018-12-14 ENCOUNTER — Other Ambulatory Visit: Payer: Self-pay | Admitting: *Deleted

## 2018-12-22 IMAGING — MG 2D DIGITAL SCREENING BILATERAL MAMMOGRAM WITH CAD AND ADJUNCT TO
6 of 9 series · 6 of 25 positions shown · non-contrast
Comparison: Previous exam(s).

CLINICAL DATA: Screening.

EXAM:
2D DIGITAL SCREENING BILATERAL MAMMOGRAM WITH CAD AND ADJUNCT TOMO

[R XCCL]
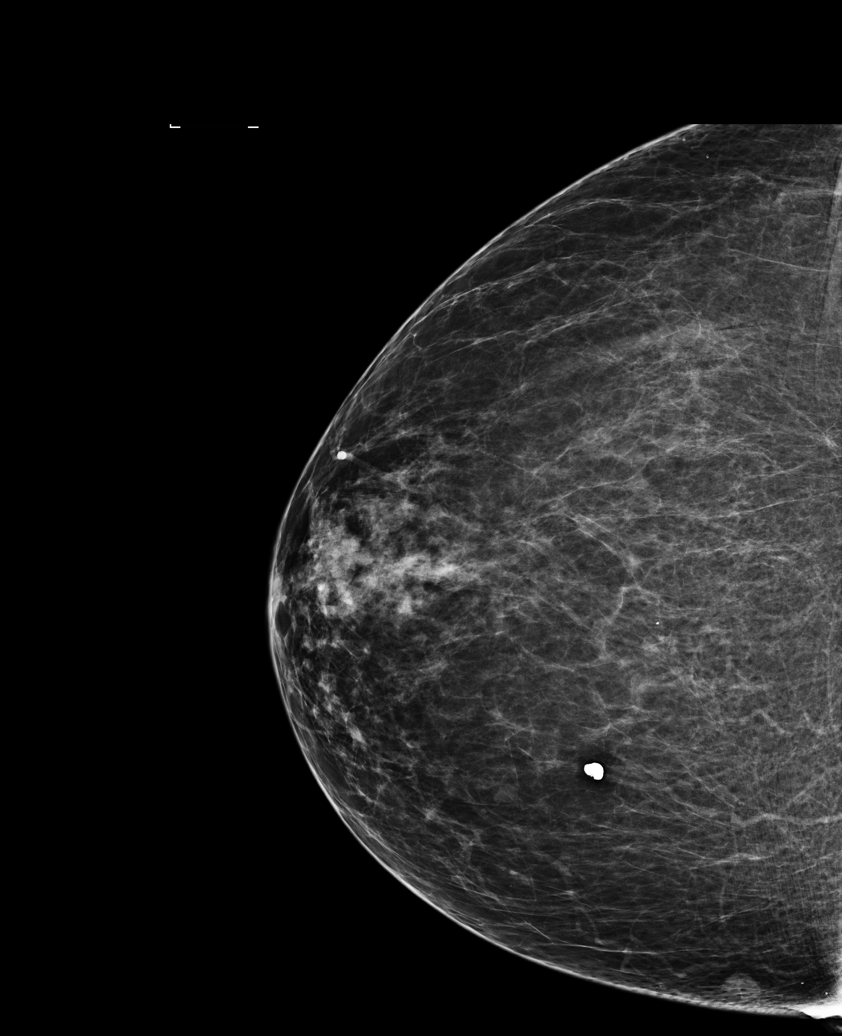

[L MLO]
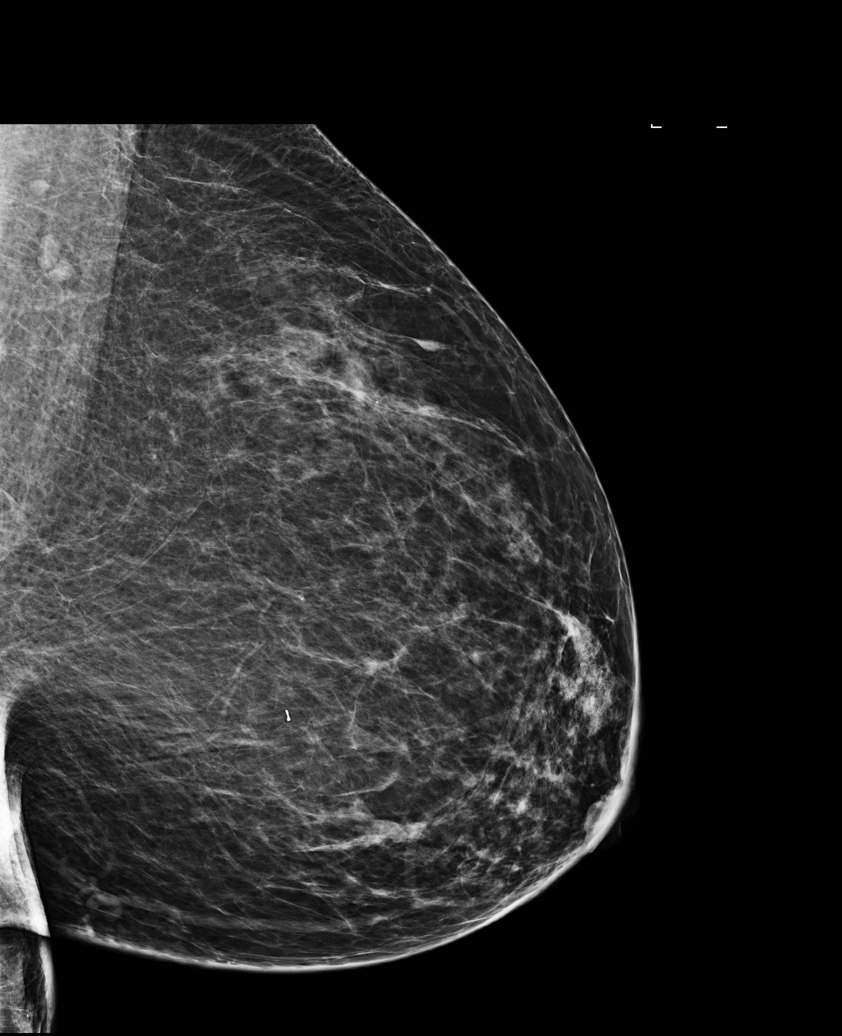

[R MLO]
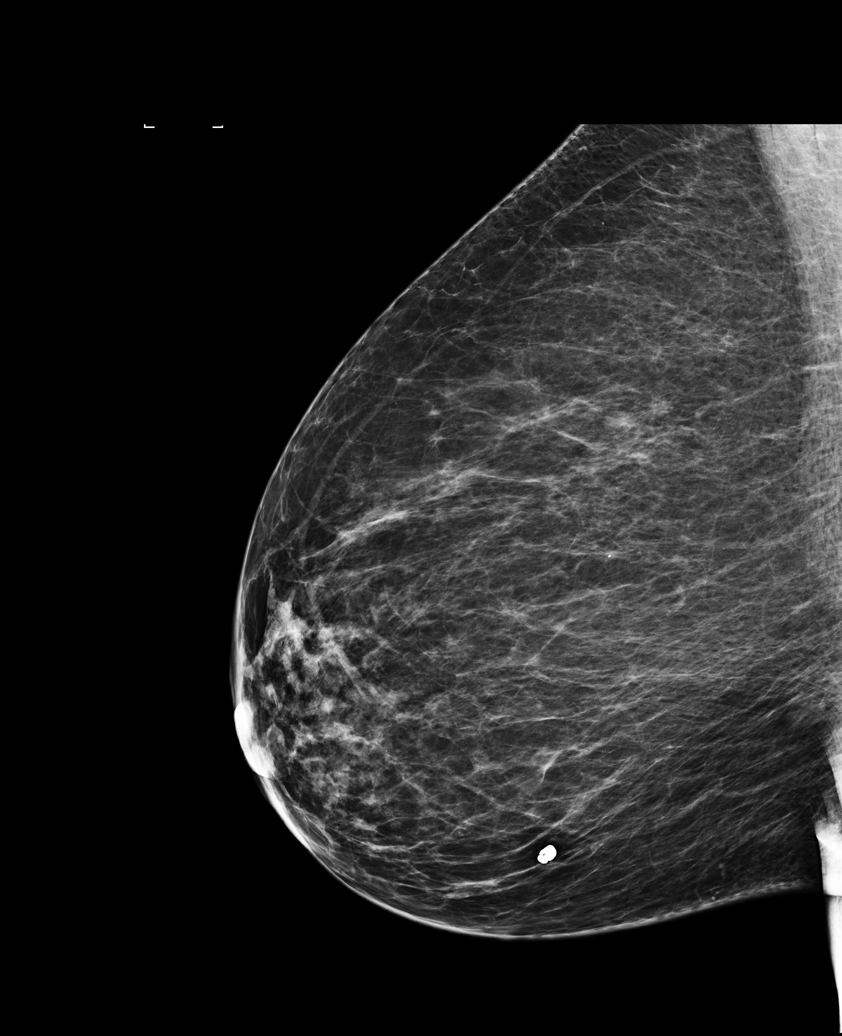

[L CC]
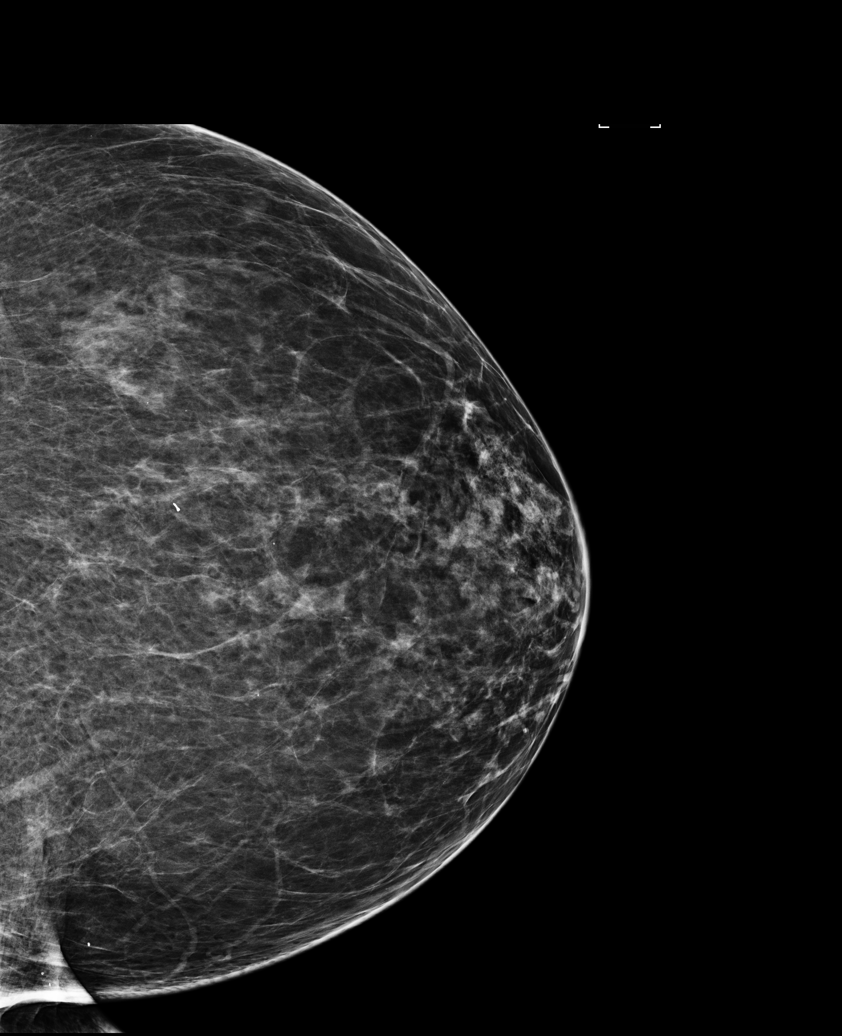

[R CC]
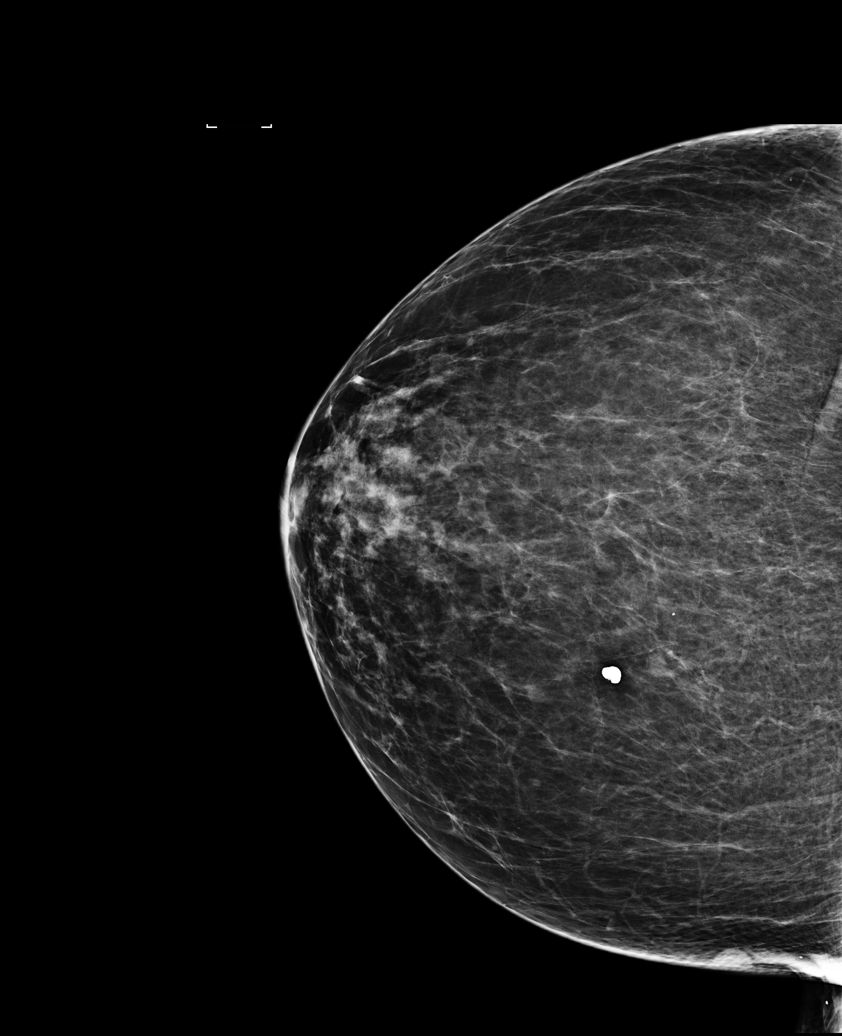

[R MLO tomo · tomo slice 39/78.0]
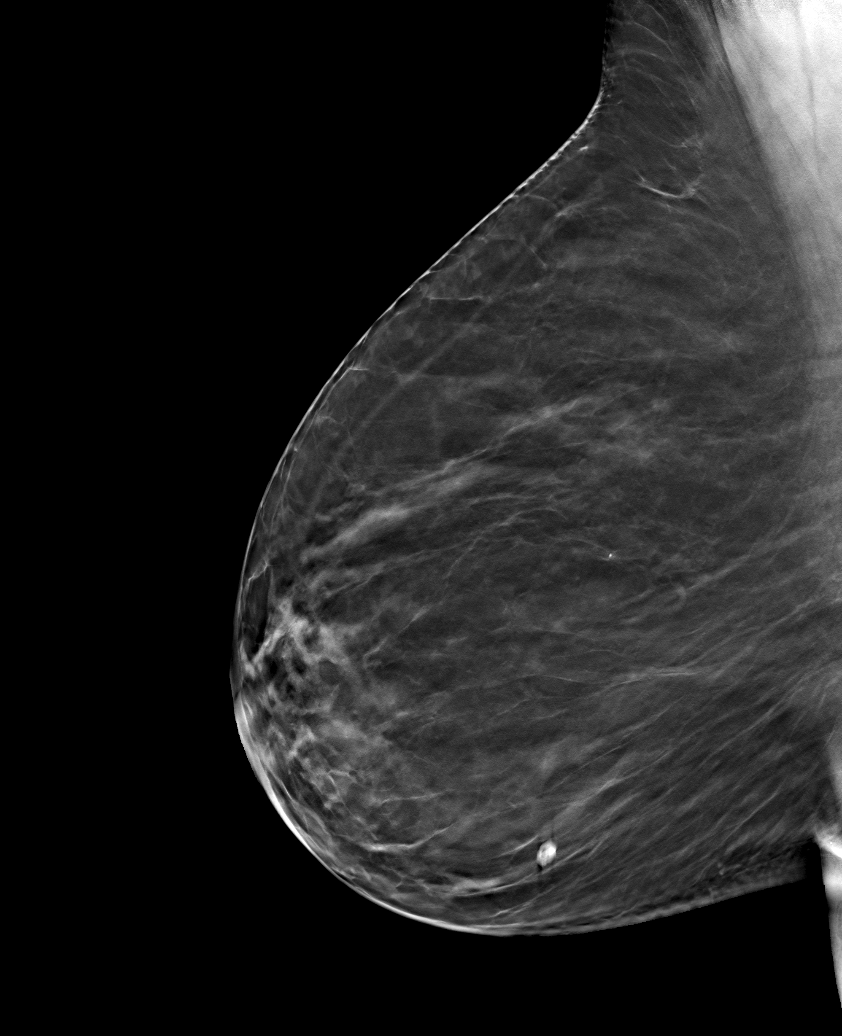

[6 of 25 positions shown; findings below may reference images not displayed]

ACR Breast Density Category b: There are scattered areas of
fibroglandular density.
FINDINGS: There are no findings suspicious for malignancy. Images were
processed with CAD.
IMPRESSION: No mammographic evidence of malignancy. A result letter of this
screening mammogram will be mailed directly to the patient.

RECOMMENDATION:
Screening mammogram in one year. (Code:97-6-RS4)

BI-RADS CATEGORY  1: Negative.

## 2018-12-27 ENCOUNTER — Other Ambulatory Visit: Payer: Self-pay | Admitting: Family Medicine

## 2018-12-27 NOTE — Telephone Encounter (Signed)
Copied from [place] [id number]. Topic: Quick Communication - Rx Refill/Question >> Dec 27, 2018  3:24 PM Percell Belt A wrote: Medication: Insulin Glargine [facility]) 100 UNIT/ML SOPN [[id number]]   Has the patient contacted their pharmacy? No. (Agent: If no, request that the patient contact the pharmacy for the refill.) (Agent: If yes, when and what did the pharmacy advise?)  Preferred Pharmacy (with phone number or street name):   [facility] [phone] (Phone)    Agent: Please be advised that RX refills may take up to 3 business days. We ask that you follow-up with your pharmacy.

## 2018-12-27 NOTE — Telephone Encounter (Signed)
Requested medication (s) are due for refill today yes  Requested medication (s) are on the active medication list yes  Future visit scheduled yes  Routing refill request to PCP due to previous prescription is by historical provider.    Requested Prescriptions  Pending Prescriptions Disp Refills   Insulin Glargine (BASAGLAR KWIKPEN) 100 UNIT/ML SOPN      Sig: Inject 0.1 mLs (10 Units total) into the skin every morning.     Endocrinology:  Diabetes - Insulins Passed - 12/27/2018  3:27 PM      Passed - HBA1C is between 0 and 7.9 and within 180 days    Hemoglobin A1C  Date Value Ref Range Status  09/01/2017 7.5  Final   Hgb A1c MFr Bld  Date Value Ref Range Status  11/05/2018 7.2 (H) 4.6 - 6.5 % Final    Comment:    Glycemic Control Guidelines for People with Diabetes:Non Diabetic:  <6%Goal of Therapy: <7%Additional Action Suggested:  >8%          Passed - Valid encounter within last 6 months    Recent Outpatient Visits          1 month ago Hyperlipidemia associated with type 2 diabetes mellitus ([place])   [facility] [place] [facility], DO   9 months ago DM (diabetes mellitus) type II uncontrolled, periph vascular disorder ([place])   [facility] [place], DO   10 months ago Nasal congestion   [facility] at [place], [place]   1 year ago Essential hypertension   [facility], Aundra Millet, MD   1 year ago Preventative health care   [place] [facility] [place], [place], [place]

## 2018-12-28 ENCOUNTER — Telehealth: Payer: Self-pay

## 2018-12-28 MED ORDER — BASAGLAR KWIKPEN 100 UNIT/ML ~~LOC~~ SOPN: 10.0000 [IU] | PEN_INJECTOR | Freq: Every morning | SUBCUTANEOUS | 5 refills | Status: DC

## 2018-12-28 NOTE — Telephone Encounter (Signed)
Pt has endocrinologist and lantus is not on our med list at all --- basaglar is

## 2018-12-28 NOTE — Telephone Encounter (Signed)
I received a rx request from pharmacy requesting lantus. On the sig that was sent it stated 0.72mL (10 units daily) Patient states she takes 10 units twice daily. Please advise and refill if appropriate. Sent to [facility] [place].

## 2018-12-31 NOTE — Telephone Encounter (Signed)
Spoke with patient and she stated that she is no longer seeing endocrinology.  I removed it out of her care team.  It looks like we refilled a historical order for her refill.  She stated that she takes 10 units in the morning and 10 units in the evening.  Advised that I will get you to review labs and based on what they were you will adjust Basaglar.

## 2018-12-31 NOTE — Telephone Encounter (Signed)
Inc basaglar 12 u qd Recheck labs in may

## 2019-01-01 MED ORDER — BASAGLAR KWIKPEN 100 UNIT/ML ~~LOC~~ SOPN: 12.0000 [IU] | PEN_INJECTOR | Freq: Every morning | SUBCUTANEOUS | 1 refills | Status: DC

## 2019-01-01 NOTE — Addendum Note (Signed)
Addended by: Kem Boroughs D on: 01/01/2019 05:18 PM   Modules accepted: Orders

## 2019-01-01 NOTE — Telephone Encounter (Signed)
Patient notified of change.  She is to take once a day and not twice a day per Dr. Etter Sjogren because this is a once a day medication.  We will send in new script for 12 units per day to put on file.  Per Dr. Etter Sjogren she needs to see endo.  She will call her insurance to check for endo and she will let us know so we can refer her.

## 2019-01-01 NOTE — Telephone Encounter (Deleted)
Patient notified of change.  She is to take once a day and not twice a day per Dr. Etter Sjogren because this is a once a day medication.  We will send in new script for 12 units per day to put on file.

## 2019-01-07 ENCOUNTER — Ambulatory Visit (INDEPENDENT_AMBULATORY_CARE_PROVIDER_SITE_OTHER): Payer: 59 | Admitting: Physician Assistant

## 2019-01-07 ENCOUNTER — Encounter (INDEPENDENT_AMBULATORY_CARE_PROVIDER_SITE_OTHER): Payer: Self-pay

## 2019-01-22 ENCOUNTER — Encounter (INDEPENDENT_AMBULATORY_CARE_PROVIDER_SITE_OTHER): Payer: Self-pay

## 2019-01-28 ENCOUNTER — Other Ambulatory Visit (INDEPENDENT_AMBULATORY_CARE_PROVIDER_SITE_OTHER): Payer: Self-pay | Admitting: Physician Assistant

## 2019-01-28 DIAGNOSIS — E559 Vitamin D deficiency, unspecified: Secondary | ICD-10-CM

## 2019-01-29 ENCOUNTER — Encounter (INDEPENDENT_AMBULATORY_CARE_PROVIDER_SITE_OTHER): Payer: Self-pay

## 2019-01-30 ENCOUNTER — Other Ambulatory Visit: Payer: 59

## 2019-01-31 ENCOUNTER — Other Ambulatory Visit (INDEPENDENT_AMBULATORY_CARE_PROVIDER_SITE_OTHER): Payer: 59

## 2019-01-31 ENCOUNTER — Other Ambulatory Visit: Payer: Self-pay

## 2019-01-31 DIAGNOSIS — E1165 Type 2 diabetes mellitus with hyperglycemia: Secondary | ICD-10-CM | POA: Diagnosis not present

## 2019-01-31 DIAGNOSIS — E785 Hyperlipidemia, unspecified: Secondary | ICD-10-CM | POA: Diagnosis not present

## 2019-01-31 DIAGNOSIS — E1169 Type 2 diabetes mellitus with other specified complication: Secondary | ICD-10-CM | POA: Diagnosis not present

## 2019-01-31 LAB — COMPREHENSIVE METABOLIC PANEL
ALT: 9 U/L (ref 0–35)
AST: 10 U/L (ref 0–37)
Albumin: 3.8 g/dL (ref 3.5–5.2)
Alkaline Phosphatase: 62 U/L (ref 39–117)
BUN: 26 mg/dL — ABNORMAL HIGH (ref 6–23)
CO2: 29 mEq/L (ref 19–32)
Calcium: 9.2 mg/dL (ref 8.4–10.5)
Chloride: 103 mEq/L (ref 96–112)
Creatinine, Ser: 1.08 mg/dL (ref 0.40–1.20)
GFR: 62.72 mL/min (ref >60.00)
Glucose, Bld: 105 mg/dL — ABNORMAL HIGH (ref 70–99)
Potassium: 4.5 mEq/L (ref 3.5–5.1)
Sodium: 140 mEq/L (ref 135–145)
Total Bilirubin: 0.5 mg/dL (ref 0.2–1.2)
Total Protein: 6.4 g/dL (ref 6.0–8.3)

## 2019-01-31 LAB — LIPID PANEL
Cholesterol: 214 mg/dL — ABNORMAL HIGH (ref 0–200)
HDL: 48.3 mg/dL (ref >39.00)
LDL Cholesterol: 141 mg/dL — ABNORMAL HIGH (ref 0–99)
NonHDL: 165.85
Total CHOL/HDL Ratio: 4
Triglycerides: 124 mg/dL (ref 0.0–149.0)
VLDL: 24.8 mg/dL (ref 0.0–40.0)

## 2019-01-31 LAB — HEMOGLOBIN A1C: Hgb A1c MFr Bld: 7.7 % — ABNORMAL HIGH (ref 4.6–6.5)

## 2019-02-01 ENCOUNTER — Other Ambulatory Visit: Payer: Self-pay

## 2019-02-06 ENCOUNTER — Other Ambulatory Visit: Payer: Self-pay | Admitting: *Deleted

## 2019-02-06 DIAGNOSIS — E785 Hyperlipidemia, unspecified: Secondary | ICD-10-CM

## 2019-02-06 DIAGNOSIS — E119 Type 2 diabetes mellitus without complications: Secondary | ICD-10-CM

## 2019-02-06 DIAGNOSIS — Z794 Long term (current) use of insulin: Principal | ICD-10-CM

## 2019-02-06 DIAGNOSIS — I10 Essential (primary) hypertension: Secondary | ICD-10-CM

## 2019-02-06 MED ORDER — ROSUVASTATIN CALCIUM 5 MG PO TABS: 5.0000 mg | ORAL_TABLET | Freq: Every day | ORAL | 2 refills | Status: DC

## 2019-02-06 NOTE — Progress Notes (Signed)
cr

## 2019-02-07 ENCOUNTER — Ambulatory Visit (INDEPENDENT_AMBULATORY_CARE_PROVIDER_SITE_OTHER): Payer: 59 | Admitting: Physician Assistant

## 2019-02-07 ENCOUNTER — Other Ambulatory Visit: Payer: Self-pay

## 2019-02-07 ENCOUNTER — Encounter (INDEPENDENT_AMBULATORY_CARE_PROVIDER_SITE_OTHER): Payer: Self-pay | Admitting: Physician Assistant

## 2019-02-07 DIAGNOSIS — E559 Vitamin D deficiency, unspecified: Secondary | ICD-10-CM | POA: Diagnosis not present

## 2019-02-07 DIAGNOSIS — Z6830 Body mass index (BMI) 30.0-30.9, adult: Secondary | ICD-10-CM | POA: Diagnosis not present

## 2019-02-07 DIAGNOSIS — E669 Obesity, unspecified: Secondary | ICD-10-CM

## 2019-02-07 DIAGNOSIS — E7849 Other hyperlipidemia: Secondary | ICD-10-CM | POA: Diagnosis not present

## 2019-02-07 MED ORDER — VITAMIN D (ERGOCALCIFEROL) 1.25 MG (50000 UNIT) PO CAPS: ORAL_CAPSULE | ORAL | 0 refills | Status: DC

## 2019-02-11 ENCOUNTER — Encounter: Payer: Self-pay | Admitting: Family Medicine

## 2019-02-11 DIAGNOSIS — E119 Type 2 diabetes mellitus without complications: Secondary | ICD-10-CM

## 2019-02-11 MED ORDER — BASAGLAR KWIKPEN 100 UNIT/ML ~~LOC~~ SOPN: 15.0000 [IU] | PEN_INJECTOR | Freq: Every morning | SUBCUTANEOUS | 1 refills | Status: DC

## 2019-02-11 NOTE — Progress Notes (Signed)
Office: [phone]  /  Fax: [phone] TeleHealth Visit:  Kathleen Acevedo has verbally consented to this TeleHealth visit today. The patient is located at home, the provider is located at the [facility] office. The participants in this visit include the listed provider and patient. The visit was conducted today via face time.  HPI:   Chief Complaint: OBESITY Kathleen Acevedo is here to discuss her progress with her obesity treatment plan. She is on the keep a food journal with 1200 calories and 90 grams of protein daily and is following her eating plan approximately 50-60 % of the time. She states she is walking for 60 minutes 7 times per week. Kathleen Acevedo reports that she has been stress and boredom eating. She is struggling to find motivation to workout at home, though she has been walking some.  We were unable to weigh the patient today for this TeleHealth visit. She feels as if she has maintained her weight since her last visit. She has lost 12 lbs since starting treatment with [place].  Vitamin D Deficiency Kathleen Acevedo has a diagnosis of vitamin D deficiency. She is currently taking prescription Vit D and denies nausea, vomiting or muscle weakness.  Hyperlipidemia Kathleen Acevedo has hyperlipidemia and has been trying to improve her cholesterol levels with intensive lifestyle modification including a low saturated fat diet, exercise and weight loss. Her primary care physician recently prescribed Crestor. She denies any chest pain, claudication or myalgias.  ASSESSMENT AND PLAN:  Vitamin D deficiency - Plan: Vitamin D, Ergocalciferol, (DRISDOL) 1.25 MG (50000 UT) CAPS capsule  Other hyperlipidemia  Class 1 obesity with serious comorbidity and body mass index (BMI) of 30.0 to 30.9 in adult, unspecified obesity type  PLAN:  Vitamin D Deficiency Kathleen Acevedo was informed that low vitamin D levels contributes to fatigue and are associated with obesity, breast, and colon cancer. Lynnda agrees to decrease  prescription Vit D to 50,000 IU every 14 days #2 with no refills. She will follow up for routine testing of vitamin D, at least 2-3 times per year. She was informed of the risk of over-replacement of vitamin D and agrees to not increase her dose unless she discusses this with [place] first. Kathleen Acevedo agrees to follow up with our clinic in 3 weeks.  Hyperlipidemia Kathleen Acevedo was informed of the American Heart Association Guidelines emphasizing intensive lifestyle modifications as the first line treatment for hyperlipidemia. We discussed many lifestyle modifications today in depth, and Kathleen Acevedo will continue to work on decreasing saturated fats such as fatty red meat, butter and many fried foods. Kathleen Acevedo agrees to continue her medications, and she will also increase vegetables and lean protein in her diet and continue to work on exercise and weight loss efforts. Kathleen Acevedo agrees to follow up with our clinic in 3 weeks.  Obesity Kathleen Acevedo is currently in the action stage of change. As such, her goal is to continue with weight loss efforts She has agreed to keep a food journal with 1200 calories and 90 grams of protein daily Kathleen Acevedo has been instructed to work up to a goal of 150 minutes of combined cardio and strengthening exercise per week for weight loss and overall health benefits. We discussed the following Behavioral Modification Strategies today: work on meal planning and easy cooking plans and keeping healthy foods in the home.   Kathleen Acevedo has agreed to follow up with our clinic in 3 weeks. She was informed of the importance of frequent follow up visits to maximize her success with intensive lifestyle  modifications for her multiple health conditions.  ALLERGIES: No Known Allergies  MEDICATIONS: Current Outpatient Medications on File Prior to Visit  Medication Sig Dispense Refill  . empagliflozin (JARDIANCE) 25 MG TABS tablet Take 25 mg by mouth daily. 30 tablet 1  . ferrous sulfate 325 (65 FE) MG tablet Take 1  tablet (325 mg total) by mouth 2 (two) times daily.    [name] glucose blood (ONE TOUCH ULTRA TEST) test strip Blood sugars to be checked 2 times daily 200 each 1  . Insulin Glargine (BASAGLAR KWIKPEN) 100 UNIT/ML SOPN Inject 0.12 mLs (12 Units total) into the skin every morning. 15 mL 1  . lisinopril-hydrochlorothiazide (PRINZIDE,ZESTORETIC) 10-12.5 MG tablet Take 1 tablet by mouth daily. 90 tablet 1  . metFORMIN (GLUCOPHAGE) 1000 MG tablet Take 1 tablet (1,000 mg total) by mouth 2 (two) times daily. 180 tablet 1  . ONETOUCH DELICA LANCETS 02H MISC 1 Stick by Does not apply route 4 (four) times daily. 100 each 6  . rosuvastatin (CRESTOR) 5 MG tablet Take 1 tablet (5 mg total) by mouth daily. 30 tablet 2   No current facility-administered medications on file prior to visit.     PAST MEDICAL HISTORY: Past Medical History:  Diagnosis Date  . Diabetes mellitus   . Hyperlipidemia   . Hypertension     PAST SURGICAL HISTORY: Past Surgical History:  Procedure Laterality Date  . ABDOMINAL HYSTERECTOMY    . BREAST BIOPSY Left   . OOPHORECTOMY      SOCIAL HISTORY: Social History   Tobacco Use  . Smoking status: Never Smoker  . Smokeless tobacco: Never Used  Substance Use Topics  . Alcohol use: No    Alcohol/week: 0.0 standard drinks  . Drug use: No    FAMILY HISTORY: Family History  Problem Relation Age of Onset  . Diabetes Father   . High blood pressure Father   . Obesity Father   . Colon cancer Neg Hx   . Colon polyps Neg Hx     ROS: Review of Systems  Constitutional: Negative for weight loss.  Cardiovascular: Negative for chest pain and claudication.  Gastrointestinal: Negative for nausea and vomiting.  Musculoskeletal: Negative for myalgias.       Negative muscle weakness    PHYSICAL EXAM: Pt in no acute distress  RECENT LABS AND TESTS: BMET    Component Value Date/Time   NA 140 01/31/2019 0810   NA 140 08/23/2018 0922   K 4.5 01/31/2019 0810   CL 103  01/31/2019 0810   CO2 29 01/31/2019 0810   GLUCOSE 105 (H) 01/31/2019 0810   BUN 26 (H) 01/31/2019 0810   BUN 26 (H) 08/23/2018 0922   CREATININE 1.08 01/31/2019 0810   CREATININE 0.89 09/13/2013 1546   CALCIUM 9.2 01/31/2019 0810   GFRNONAA 57 (L) 08/23/2018 0922   GFRAA 66 08/23/2018 0922   Lab Results  Component Value Date   HGBA1C 7.7 (H) 01/31/2019   HGBA1C 7.2 (H) 11/05/2018   HGBA1C 6.5 (H) 08/23/2018   HGBA1C 8.5 (H) 03/27/2018   HGBA1C 7.5 09/01/2017   No results found for: INSULIN CBC    Component Value Date/Time   WBC 4.6 01/29/2018 0725   RBC 3.99 01/29/2018 0725   HGB 11.8 (L) 01/29/2018 0725   HCT 36.1 01/29/2018 0725   PLT 222.0 01/29/2018 0725   MCV 90.4 01/29/2018 0725   MCH 29.2 09/13/2013 1546   MCHC 32.7 01/29/2018 0725   RDW 14.3 01/29/2018 0725   LYMPHSABS  1.4 01/29/2018 0725   MONOABS 0.4 01/29/2018 0725   EOSABS 0.2 01/29/2018 0725   BASOSABS 0.0 01/29/2018 0725   Iron/TIBC/Ferritin/ %Sat    Component Value Date/Time   IRON 54 01/29/2018 0725   FERRITIN 15.1 01/29/2018 0725   IRONPCTSAT 15.0 (L) 01/29/2018 0725   Lipid Panel     Component Value Date/Time   CHOL 214 (H) 01/31/2019 0810   CHOL 223 (H) 08/23/2018 0922   TRIG 124.0 01/31/2019 0810   HDL 48.30 01/31/2019 0810   HDL 58 08/23/2018 0922   CHOLHDL 4 01/31/2019 0810   VLDL 24.8 01/31/2019 0810   LDLCALC 141 (H) 01/31/2019 0810   LDLCALC 147 (H) 08/23/2018 0922   LDLDIRECT 103.6 08/23/2012 1553   Hepatic Function Panel     Component Value Date/Time   PROT 6.4 01/31/2019 0810   PROT 7.1 08/23/2018 0922   ALBUMIN 3.8 01/31/2019 0810   ALBUMIN 4.6 08/23/2018 0922   AST 10 01/31/2019 0810   ALT 9 01/31/2019 0810   ALKPHOS 62 01/31/2019 0810   BILITOT 0.5 01/31/2019 0810   BILITOT 0.3 08/23/2018 0922   BILIDIR 0.1 12/15/2017 0912   IBILI 0.2 09/13/2013 1546      Component Value Date/Time   TSH 0.902 03/27/2018 1158   TSH 1.67 12/15/2017 0912   TSH 1.42 02/19/2016  1109      I, Trixie Dredge, am acting as transcriptionist for Abby Potash, PA-C I, Abby Potash, PA-C have reviewed above note and agree with its content

## 2019-02-12 MED ORDER — EMPAGLIFLOZIN 25 MG PO TABS: 25.0000 mg | ORAL_TABLET | Freq: Every day | ORAL | 3 refills | Status: DC

## 2019-02-12 NOTE — Addendum Note (Signed)
Addended byDamita Dunnings D on: 02/12/2019 01:49 PM   Modules accepted: Orders

## 2019-02-26 ENCOUNTER — Encounter (INDEPENDENT_AMBULATORY_CARE_PROVIDER_SITE_OTHER): Payer: Self-pay | Admitting: Physician Assistant

## 2019-02-26 ENCOUNTER — Ambulatory Visit (INDEPENDENT_AMBULATORY_CARE_PROVIDER_SITE_OTHER): Payer: 59 | Admitting: Physician Assistant

## 2019-02-26 ENCOUNTER — Other Ambulatory Visit: Payer: Self-pay

## 2019-02-26 DIAGNOSIS — E669 Obesity, unspecified: Secondary | ICD-10-CM | POA: Diagnosis not present

## 2019-02-26 DIAGNOSIS — E559 Vitamin D deficiency, unspecified: Secondary | ICD-10-CM

## 2019-02-26 DIAGNOSIS — E119 Type 2 diabetes mellitus without complications: Secondary | ICD-10-CM | POA: Diagnosis not present

## 2019-02-26 DIAGNOSIS — Z794 Long term (current) use of insulin: Secondary | ICD-10-CM | POA: Diagnosis not present

## 2019-02-26 DIAGNOSIS — Z6830 Body mass index (BMI) 30.0-30.9, adult: Secondary | ICD-10-CM

## 2019-02-26 MED ORDER — VITAMIN D (ERGOCALCIFEROL) 1.25 MG (50000 UNIT) PO CAPS: ORAL_CAPSULE | ORAL | 0 refills | Status: DC

## 2019-02-26 NOTE — Progress Notes (Signed)
Office: [phone]  /  Fax: [phone] TeleHealth Visit:  Kathleen Acevedo has verbally consented to this TeleHealth visit today. The patient is located at home, the provider is located at the [employer] [facility] office. The participants in this visit include the listed provider and patient. The visit was conducted today via FaceTime.  HPI:   Chief Complaint: OBESITY Kathleen Acevedo is here to discuss her progress with her obesity treatment plan. She is keeping a food journal with 1200 calories and 90 grams of protein daily and is following her eating plan approximately 60% of the time. She states she is walking 60-90 minutes 5-6 times per week. Kathleen Acevedo reports that she has been stress eating. She notes that she has had cravings for bread frequently. She states she is ready to get back on track. We were unable to weigh the patient today for this TeleHealth visit. She feels as if she has gained 2 lbs since her last visit. She has lost 12 lbs since starting treatment with [place].  Vitamin D deficiency [name] has a diagnosis of Vitamin D deficiency. She is currently taking prescription Vit D and denies nausea, vomiting or muscle weakness.  Diabetes II [name] has a diagnosis of diabetes type II and is on Jardiance, metformin, and Lantus insulin. [name] states fasting blood sugars are in the range of 91 and 147. Last A1c was 7.7 on 01/31/2019. She has been working on intensive lifestyle modifications including diet, exercise, and weight loss to help control her blood glucose levels.  ASSESSMENT AND PLAN:  Vitamin D deficiency - Plan: Vitamin D, Ergocalciferol, (DRISDOL) 1.25 MG (50000 UT) CAPS capsule  Type 2 diabetes mellitus without complication, with long-term current use of insulin (HCC)  Class 1 obesity with serious comorbidity and body mass index (BMI) of 30.0 to 30.9 in adult, unspecified obesity type  PLAN:  Vitamin D Deficiency Kathleen Acevedo was informed that low Vitamin D levels contributes  to fatigue and are associated with obesity, breast, and colon cancer. She agrees to continue to take prescription Vit D @ 50,000 IU one every other week #2 with 0 refills and will follow-up for routine testing of Vitamin D, at least 2-3 times per year. She was informed of the risk of over-replacement of Vitamin D and agrees to not increase her dose unless she discusses this with [place] first. [name] agrees to follow-up with our clinic in 2 weeks.  Diabetes II Kathleen Acevedo has been given extensive diabetes education by myself today including ideal fasting and post-prandial blood glucose readings, individual ideal HgA1c goals  and hypoglycemia prevention. We discussed the importance of good blood sugar control to decrease the likelihood of diabetic complications such as nephropathy, neuropathy, limb loss, blindness, coronary artery disease, and death. We discussed the importance of intensive lifestyle modification including diet, exercise and weight loss as the first line treatment for diabetes. [name] agrees to continue her diabetes medications, weight loss, and will follow-up at the agreed upon time.  Obesity [name] is currently in the action stage of change. As such, her goal is to continue with weight loss efforts. She has agreed to keep a food journal with 1200 calories and 90 grams of protein daily. [name] has been instructed to work up to a goal of 150 minutes of combined cardio and strengthening exercise per week for weight loss and overall health benefits. We discussed the following Behavioral Modification Strategies today: work on meal planning, easy cooking plans, and keeping healthy foods in the home.  Kathleen Acevedo has  agreed to follow-up with our clinic in 2 weeks. She was informed of the importance of frequent follow-up visits to maximize her success with intensive lifestyle modifications for her multiple health conditions.  ALLERGIES: No Known Allergies  MEDICATIONS: Current Outpatient Medications on  File Prior to Visit  Medication Sig Dispense Refill  . empagliflozin (JARDIANCE) 25 MG TABS tablet Take 25 mg by mouth daily. 30 tablet 3  . ferrous sulfate 325 (65 FE) MG tablet Take 1 tablet (325 mg total) by mouth 2 (two) times daily.    [name] glucose blood (ONE TOUCH ULTRA TEST) test strip Blood sugars to be checked 2 times daily 200 each 1  . Insulin Glargine (BASAGLAR KWIKPEN) 100 UNIT/ML SOPN Inject 0.15 mLs (15 Units total) into the skin every morning. 15 mL 1  . lisinopril-hydrochlorothiazide (PRINZIDE,ZESTORETIC) 10-12.5 MG tablet Take 1 tablet by mouth daily. 90 tablet 1  . metFORMIN (GLUCOPHAGE) 1000 MG tablet Take 1 tablet (1,000 mg total) by mouth 2 (two) times daily. 180 tablet 1  . ONETOUCH DELICA LANCETS 40C MISC 1 Stick by Does not apply route 4 (four) times daily. 100 each 6  . rosuvastatin (CRESTOR) 5 MG tablet Take 1 tablet (5 mg total) by mouth daily. 30 tablet 2   No current facility-administered medications on file prior to visit.     PAST MEDICAL HISTORY: Past Medical History:  Diagnosis Date  . Diabetes mellitus   . Hyperlipidemia   . Hypertension     PAST SURGICAL HISTORY: Past Surgical History:  Procedure Laterality Date  . ABDOMINAL HYSTERECTOMY    . BREAST BIOPSY Left   . OOPHORECTOMY      SOCIAL HISTORY: Social History   Tobacco Use  . Smoking status: Never Smoker  . Smokeless tobacco: Never Used  Substance Use Topics  . Alcohol use: No    Alcohol/week: 0.0 standard drinks  . Drug use: No    FAMILY HISTORY: Family History  Problem Relation Age of Onset  . Diabetes Father   . High blood pressure Father   . Obesity Father   . Colon cancer Neg Hx   . Colon polyps Neg Hx    ROS: Review of Systems  Gastrointestinal: Negative for nausea and vomiting.  Musculoskeletal:       Negative for muscle weakness.   PHYSICAL EXAM: Pt in no acute distress  RECENT LABS AND TESTS: BMET    Component Value Date/Time   NA 140 01/31/2019 0810   NA  140 08/23/2018 0922   K 4.5 01/31/2019 0810   CL 103 01/31/2019 0810   CO2 29 01/31/2019 0810   GLUCOSE 105 (H) 01/31/2019 0810   BUN 26 (H) 01/31/2019 0810   BUN 26 (H) 08/23/2018 0922   CREATININE 1.08 01/31/2019 0810   CREATININE 0.89 09/13/2013 1546   CALCIUM 9.2 01/31/2019 0810   GFRNONAA 57 (L) 08/23/2018 0922   GFRAA 66 08/23/2018 0922   Lab Results  Component Value Date   HGBA1C 7.7 (H) 01/31/2019   HGBA1C 7.2 (H) 11/05/2018   HGBA1C 6.5 (H) 08/23/2018   HGBA1C 8.5 (H) 03/27/2018   HGBA1C 7.5 09/01/2017   No results found for: INSULIN CBC    Component Value Date/Time   WBC 4.6 01/29/2018 0725   RBC 3.99 01/29/2018 0725   HGB 11.8 (L) 01/29/2018 0725   HCT 36.1 01/29/2018 0725   PLT 222.0 01/29/2018 0725   MCV 90.4 01/29/2018 0725   MCH 29.2 09/13/2013 1546   MCHC 32.7 01/29/2018 0725  RDW 14.3 01/29/2018 0725   LYMPHSABS 1.4 01/29/2018 0725   MONOABS 0.4 01/29/2018 0725   EOSABS 0.2 01/29/2018 0725   BASOSABS 0.0 01/29/2018 0725   Iron/TIBC/Ferritin/ %Sat    Component Value Date/Time   IRON 54 01/29/2018 0725   FERRITIN 15.1 01/29/2018 0725   IRONPCTSAT 15.0 (L) 01/29/2018 0725   Lipid Panel     Component Value Date/Time   CHOL 214 (H) 01/31/2019 0810   CHOL 223 (H) 08/23/2018 0922   TRIG 124.0 01/31/2019 0810   HDL 48.30 01/31/2019 0810   HDL 58 08/23/2018 0922   CHOLHDL 4 01/31/2019 0810   VLDL 24.8 01/31/2019 0810   LDLCALC 141 (H) 01/31/2019 0810   LDLCALC 147 (H) 08/23/2018 0922   LDLDIRECT 103.6 08/23/2012 1553   Hepatic Function Panel     Component Value Date/Time   PROT 6.4 01/31/2019 0810   PROT 7.1 08/23/2018 0922   ALBUMIN 3.8 01/31/2019 0810   ALBUMIN 4.6 08/23/2018 0922   AST 10 01/31/2019 0810   ALT 9 01/31/2019 0810   ALKPHOS 62 01/31/2019 0810   BILITOT 0.5 01/31/2019 0810   BILITOT 0.3 08/23/2018 0922   BILIDIR 0.1 12/15/2017 0912   IBILI 0.2 09/13/2013 1546      Component Value Date/Time   TSH 0.902 03/27/2018  1158   TSH 1.67 12/15/2017 0912   TSH 1.42 02/19/2016 1109   Results for Dorrance, OAKLIE DURRETT (MRN [id number]) as of 02/26/2019 14:53  Ref. Range 08/23/2018 09:22  Vitamin D, 25-Hydroxy Latest Ref Range: 30.0 - 100.0 ng/mL 53.2   I, Michaelene Song, am acting as Location manager for [employer], PA-C I, Abby Potash, PA-C have reviewed above note and agree with its content

## 2019-03-13 ENCOUNTER — Ambulatory Visit (INDEPENDENT_AMBULATORY_CARE_PROVIDER_SITE_OTHER): Payer: 59 | Admitting: Physician Assistant

## 2019-03-13 ENCOUNTER — Other Ambulatory Visit: Payer: Self-pay

## 2019-03-13 DIAGNOSIS — E119 Type 2 diabetes mellitus without complications: Secondary | ICD-10-CM | POA: Diagnosis not present

## 2019-03-13 DIAGNOSIS — E559 Vitamin D deficiency, unspecified: Secondary | ICD-10-CM

## 2019-03-13 DIAGNOSIS — E669 Obesity, unspecified: Secondary | ICD-10-CM

## 2019-03-13 DIAGNOSIS — Z6830 Body mass index (BMI) 30.0-30.9, adult: Secondary | ICD-10-CM

## 2019-03-13 MED ORDER — VITAMIN D (ERGOCALCIFEROL) 1.25 MG (50000 UNIT) PO CAPS: ORAL_CAPSULE | ORAL | 0 refills | Status: DC

## 2019-03-14 NOTE — Progress Notes (Signed)
Office: [phone]  /  Fax: [phone] TeleHealth Visit:  Kathleen Acevedo has verbally consented to this TeleHealth visit today. The patient is located at home, the provider is located at the [facility] office. The participants in this visit include the listed provider and patient. The visit was conducted today via FaceTime.  HPI:   Chief Complaint: OBESITY Kathleen Acevedo is here to discuss her progress with her obesity treatment plan. She is keeping a food journal with 1200 calories and 90 grams of protein daily and is following her eating plan approximately 80% of the time. She states she is walking 86 minutes 5-6 times per week and toning 10 minutes 3 days a week. Cadience reports that her weight today is 198 lbs. She has been journaling more and reaching her protein goal daily.  We were unable to weigh the patient today for this TeleHealth visit. She states her weight today is 198 lbs. She has lost 12 lbs since starting treatment with [place].  Vitamin D deficiency Kathleen Acevedo has a diagnosis of Vitamin D deficiency. She is currently taking prescription Vit D and denies nausea, vomiting or muscle weakness.  Diabetes II Kathleen Acevedo has a diagnosis of diabetes type II and is on metformin and Lantus. Kathleen Acevedo states fasting blood sugars average 108. She denies any hypoglycemic episodes. Last A1c was 7.7 on 01/31/2019. She has been working on intensive lifestyle modifications including diet, exercise, and weight loss to help control her blood glucose levels. No nausea, vomiting, or diarrhea.  ASSESSMENT AND PLAN:  Vitamin D deficiency - Plan: Vitamin D, Ergocalciferol, (DRISDOL) 1.25 MG (50000 UT) CAPS capsule  Type 2 diabetes mellitus without complication, without long-term current use of insulin (HCC)  Class 1 obesity with serious comorbidity and body mass index (BMI) of 30.0 to 30.9 in adult, unspecified obesity type  PLAN:  Vitamin D Deficiency Kathleen Acevedo was informed that low Vitamin D levels  contributes to fatigue and are associated with obesity, breast, and colon cancer. She agrees to continue to take prescription Vit D @ 50,000 IU one every other week #2 with 0 refills and will follow-up for routine testing of Vitamin D, at least 2-3 times per year. She was informed of the risk of over-replacement of Vitamin D and agrees to not increase her dose unless she discusses this with [place] first. Kathleen Acevedo agrees to follow-up with our clinic in 2 weeks.  Diabetes II Andree has been given extensive diabetes education by myself today including ideal fasting and post-prandial blood glucose readings, individual ideal HgA1c goals  and hypoglycemia prevention. We discussed the importance of good blood sugar control to decrease the likelihood of diabetic complications such as nephropathy, neuropathy, limb loss, blindness, coronary artery disease, and death. We discussed the importance of intensive lifestyle modification including diet, exercise and weight loss as the first line treatment for diabetes. Marcena agrees to continue her diabetes medications and will follow-up at the agreed upon time.  Obesity Kathleen Acevedo is currently in the action stage of change. As such, her goal is to continue with weight loss efforts. She has agreed to keep a food journal with 1200 calories and 90 grams of protein daily. Kathleen Acevedo has been instructed to work up to a goal of 150 minutes of combined cardio and strengthening exercise per week for weight loss and overall health benefits. We discussed the following Behavioral Modification Strategies today: work on meal planning, easy cooking plans, and keeping healthy foods in the home.  Kathleen Acevedo has agreed to follow-up with  our clinic in 2 weeks. She was informed of the importance of frequent follow-up visits to maximize her success with intensive lifestyle modifications for her multiple health conditions.  ALLERGIES: No Known Allergies  MEDICATIONS: Current Outpatient Medications on  File Prior to Visit  Medication Sig Dispense Refill  . empagliflozin (JARDIANCE) 25 MG TABS tablet Take 25 mg by mouth daily. 30 tablet 3  . ferrous sulfate 325 (65 FE) MG tablet Take 1 tablet (325 mg total) by mouth 2 (two) times daily.    [name] glucose blood (ONE TOUCH ULTRA TEST) test strip Blood sugars to be checked 2 times daily 200 each 1  . Insulin Glargine (BASAGLAR KWIKPEN) 100 UNIT/ML SOPN Inject 0.15 mLs (15 Units total) into the skin every morning. 15 mL 1  . lisinopril-hydrochlorothiazide (PRINZIDE,ZESTORETIC) 10-12.5 MG tablet Take 1 tablet by mouth daily. 90 tablet 1  . metFORMIN (GLUCOPHAGE) 1000 MG tablet Take 1 tablet (1,000 mg total) by mouth 2 (two) times daily. 180 tablet 1  . ONETOUCH DELICA LANCETS 91Y MISC 1 Stick by Does not apply route 4 (four) times daily. 100 each 6  . rosuvastatin (CRESTOR) 5 MG tablet Take 1 tablet (5 mg total) by mouth daily. 30 tablet 2   No current facility-administered medications on file prior to visit.     PAST MEDICAL HISTORY: Past Medical History:  Diagnosis Date  . Diabetes mellitus   . Hyperlipidemia   . Hypertension     PAST SURGICAL HISTORY: Past Surgical History:  Procedure Laterality Date  . ABDOMINAL HYSTERECTOMY    . BREAST BIOPSY Left   . OOPHORECTOMY      SOCIAL HISTORY: Social History   Tobacco Use  . Smoking status: Never Smoker  . Smokeless tobacco: Never Used  Substance Use Topics  . Alcohol use: No    Alcohol/week: 0.0 standard drinks  . Drug use: No    FAMILY HISTORY: Family History  Problem Relation Age of Onset  . Diabetes Father   . High blood pressure Father   . Obesity Father   . Colon cancer Neg Hx   . Colon polyps Neg Hx    ROS: Review of Systems  Gastrointestinal: Negative for diarrhea, nausea and vomiting.  Musculoskeletal:       Negative for muscle weakness.  Endo/Heme/Allergies:       Negative for hypoglycemia.   PHYSICAL EXAM: Pt in no acute distress  RECENT LABS AND TESTS:  BMET    Component Value Date/Time   NA 140 01/31/2019 0810   NA 140 08/23/2018 0922   K 4.5 01/31/2019 0810   CL 103 01/31/2019 0810   CO2 29 01/31/2019 0810   GLUCOSE 105 (H) 01/31/2019 0810   BUN 26 (H) 01/31/2019 0810   BUN 26 (H) 08/23/2018 0922   CREATININE 1.08 01/31/2019 0810   CREATININE 0.89 09/13/2013 1546   CALCIUM 9.2 01/31/2019 0810   GFRNONAA 57 (L) 08/23/2018 0922   GFRAA 66 08/23/2018 0922   Lab Results  Component Value Date   HGBA1C 7.7 (H) 01/31/2019   HGBA1C 7.2 (H) 11/05/2018   HGBA1C 6.5 (H) 08/23/2018   HGBA1C 8.5 (H) 03/27/2018   HGBA1C 7.5 09/01/2017   No results found for: INSULIN CBC    Component Value Date/Time   WBC 4.6 01/29/2018 0725   RBC 3.99 01/29/2018 0725   HGB 11.8 (L) 01/29/2018 0725   HCT 36.1 01/29/2018 0725   PLT 222.0 01/29/2018 0725   MCV 90.4 01/29/2018 0725   MCH 29.2 09/13/2013  1546   MCHC 32.7 01/29/2018 0725   RDW 14.3 01/29/2018 0725   LYMPHSABS 1.4 01/29/2018 0725   MONOABS 0.4 01/29/2018 0725   EOSABS 0.2 01/29/2018 0725   BASOSABS 0.0 01/29/2018 0725   Iron/TIBC/Ferritin/ %Sat    Component Value Date/Time   IRON 54 01/29/2018 0725   FERRITIN 15.1 01/29/2018 0725   IRONPCTSAT 15.0 (L) 01/29/2018 0725   Lipid Panel     Component Value Date/Time   CHOL 214 (H) 01/31/2019 0810   CHOL 223 (H) 08/23/2018 0922   TRIG 124.0 01/31/2019 0810   HDL 48.30 01/31/2019 0810   HDL 58 08/23/2018 0922   CHOLHDL 4 01/31/2019 0810   VLDL 24.8 01/31/2019 0810   LDLCALC 141 (H) 01/31/2019 0810   LDLCALC 147 (H) 08/23/2018 0922   LDLDIRECT 103.6 08/23/2012 1553   Hepatic Function Panel     Component Value Date/Time   PROT 6.4 01/31/2019 0810   PROT 7.1 08/23/2018 0922   ALBUMIN 3.8 01/31/2019 0810   ALBUMIN 4.6 08/23/2018 0922   AST 10 01/31/2019 0810   ALT 9 01/31/2019 0810   ALKPHOS 62 01/31/2019 0810   BILITOT 0.5 01/31/2019 0810   BILITOT 0.3 08/23/2018 0922   BILIDIR 0.1 12/15/2017 0912   IBILI 0.2  09/13/2013 1546      Component Value Date/Time   TSH 0.902 03/27/2018 1158   TSH 1.67 12/15/2017 0912   TSH 1.42 02/19/2016 1109   Results for Skolnik, BARBARANN KELLY (MRN [id number]) as of 03/14/2019 08:57  Ref. Range 08/23/2018 09:22  Vitamin D, 25-Hydroxy Latest Ref Range: 30.0 - 100.0 ng/mL 53.2    I, Michaelene Song, am acting as Location manager for [employer], PA-C I, Abby Potash, PA-C have reviewed above note and agree with its content

## 2019-03-28 ENCOUNTER — Encounter (INDEPENDENT_AMBULATORY_CARE_PROVIDER_SITE_OTHER): Payer: Self-pay | Admitting: Physician Assistant

## 2019-03-28 ENCOUNTER — Ambulatory Visit (INDEPENDENT_AMBULATORY_CARE_PROVIDER_SITE_OTHER): Payer: 59 | Admitting: Physician Assistant

## 2019-03-28 ENCOUNTER — Other Ambulatory Visit: Payer: Self-pay

## 2019-03-28 DIAGNOSIS — E669 Obesity, unspecified: Secondary | ICD-10-CM | POA: Diagnosis not present

## 2019-03-28 DIAGNOSIS — E119 Type 2 diabetes mellitus without complications: Secondary | ICD-10-CM

## 2019-03-28 DIAGNOSIS — Z794 Long term (current) use of insulin: Secondary | ICD-10-CM

## 2019-03-28 DIAGNOSIS — Z6830 Body mass index (BMI) 30.0-30.9, adult: Secondary | ICD-10-CM | POA: Diagnosis not present

## 2019-04-01 NOTE — Progress Notes (Signed)
Office: [phone]  /  Fax: [phone] TeleHealth Visit:  Kathleen Acevedo has verbally consented to this TeleHealth visit today. The patient is located at work, the provider is located at the [employer] [facility] office. The participants in this visit include the listed provider and patient and any and all parties involved. The visit was conducted today via FaceTime.  HPI:   Chief Complaint: OBESITY Kathleen Acevedo is here to discuss her progress with her obesity treatment plan. She is on the keep a food journal with 1200 calories and 90 grams of protein daily and is following her eating plan approximately 80 % of the time. She states she is walking 60 to 90 minutes 5 times per week. Kathleen Acevedo reports her weight to be 198 pounds. She reports that she is not getting all of her protein in daily. We were unable to weigh the patient today for this TeleHealth visit. She feels as if she has lost weight since her last visit. She has lost 14 lbs since starting treatment with [place].  Diabetes II Kathleen Acevedo has a diagnosis of diabetes type II. She is on Insulin, Jardiance and Metformin. Kathleen Acevedo states fasting BGs average between 88 and 147 and she denies any hypoglycemic episodes. Last A1c was at 7.7 She has been working on intensive lifestyle modifications including diet, exercise, and weight loss to help control her blood glucose levels.  ASSESSMENT AND PLAN:  Type 2 diabetes mellitus without complication, with long-term current use of insulin (HCC)  Class 1 obesity with serious comorbidity and body mass index (BMI) of 30.0 to 30.9 in adult, unspecified obesity type  PLAN:  Diabetes II Kathleen Acevedo has been given extensive diabetes education by myself today including ideal fasting and post-prandial blood glucose readings, individual ideal Hgb A1c goals and hypoglycemia prevention. We discussed the importance of good blood sugar control to decrease the likelihood of diabetic complications such as nephropathy,  neuropathy, limb loss, blindness, coronary artery disease, and death. We discussed the importance of intensive lifestyle modification including diet, exercise and weight loss as the first line treatment for diabetes. Kathleen Acevedo will continue with medications and weight loss and she will follow up at the agreed upon time.  Obesity Kathleen Acevedo is currently in the action stage of change. As such, her goal is to continue with weight loss efforts She has agreed to keep a food journal with 1200 calories and 90 grams of protein daily Kathleen Acevedo has been instructed to work up to a goal of 150 minutes of combined cardio and strengthening exercise per week for weight loss and overall health benefits. We discussed the following Behavioral Modification Strategies today: keeping healthy foods in the home and work on meal planning and easy cooking plans  Kathleen Acevedo has agreed to follow up with our clinic in 3 weeks. She was informed of the importance of frequent follow up visits to maximize her success with intensive lifestyle modifications for her multiple health conditions.  I spent > than 50% of the 25 minute visit on counseling as documented in the note.   ALLERGIES: No Known Allergies  MEDICATIONS: Current Outpatient Medications on File Prior to Visit  Medication Sig Dispense Refill   empagliflozin (JARDIANCE) 25 MG TABS tablet Take 25 mg by mouth daily. 30 tablet 3   ferrous sulfate 325 (65 FE) MG tablet Take 1 tablet (325 mg total) by mouth 2 (two) times daily.     glucose blood (ONE TOUCH ULTRA TEST) test strip Blood sugars to be checked 2 times daily  200 each 1   Insulin Glargine (BASAGLAR KWIKPEN) 100 UNIT/ML SOPN Inject 0.15 mLs (15 Units total) into the skin every morning. 15 mL 1   lisinopril-hydrochlorothiazide (PRINZIDE,ZESTORETIC) 10-12.5 MG tablet Take 1 tablet by mouth daily. 90 tablet 1   metFORMIN (GLUCOPHAGE) 1000 MG tablet Take 1 tablet (1,000 mg total) by mouth 2 (two) times daily. 180 tablet  1   ONETOUCH DELICA LANCETS 41P MISC 1 Stick by Does not apply route 4 (four) times daily. 100 each 6   rosuvastatin (CRESTOR) 5 MG tablet Take 1 tablet (5 mg total) by mouth daily. 30 tablet 2   Vitamin D, Ergocalciferol, (DRISDOL) 1.25 MG (50000 UT) CAPS capsule Take  One every other week 2 capsule 0   No current facility-administered medications on file prior to visit.     PAST MEDICAL HISTORY: Past Medical History:  Diagnosis Date   Diabetes mellitus    Hyperlipidemia    Hypertension     PAST SURGICAL HISTORY: Past Surgical History:  Procedure Laterality Date   ABDOMINAL HYSTERECTOMY     BREAST BIOPSY Left    OOPHORECTOMY      SOCIAL HISTORY: Social History   Tobacco Use   Smoking status: Never Smoker   Smokeless tobacco: Never Used  Substance Use Topics   Alcohol use: No    Alcohol/week: 0.0 standard drinks   Drug use: No    FAMILY HISTORY: Family History  Problem Relation Age of Onset   Diabetes Father    High blood pressure Father    Obesity Father    Colon cancer Neg Hx    Colon polyps Neg Hx     ROS: Review of Systems  Constitutional: Negative for weight loss.  Endo/Heme/Allergies:       Negative for hypoglycemia    PHYSICAL EXAM: Pt in no acute distress  RECENT LABS AND TESTS: BMET    Component Value Date/Time   NA 140 01/31/2019 0810   NA 140 08/23/2018 0922   K 4.5 01/31/2019 0810   CL 103 01/31/2019 0810   CO2 29 01/31/2019 0810   GLUCOSE 105 (H) 01/31/2019 0810   BUN 26 (H) 01/31/2019 0810   BUN 26 (H) 08/23/2018 0922   CREATININE 1.08 01/31/2019 0810   CREATININE 0.89 09/13/2013 1546   CALCIUM 9.2 01/31/2019 0810   GFRNONAA 57 (L) 08/23/2018 0922   GFRAA 66 08/23/2018 0922   Lab Results  Component Value Date   HGBA1C 7.7 (H) 01/31/2019   HGBA1C 7.2 (H) 11/05/2018   HGBA1C 6.5 (H) 08/23/2018   HGBA1C 8.5 (H) 03/27/2018   HGBA1C 7.5 09/01/2017   No results found for: INSULIN CBC    Component Value  Date/Time   WBC 4.6 01/29/2018 0725   RBC 3.99 01/29/2018 0725   HGB 11.8 (L) 01/29/2018 0725   HCT 36.1 01/29/2018 0725   PLT 222.0 01/29/2018 0725   MCV 90.4 01/29/2018 0725   MCH 29.2 09/13/2013 1546   MCHC 32.7 01/29/2018 0725   RDW 14.3 01/29/2018 0725   LYMPHSABS 1.4 01/29/2018 0725   MONOABS 0.4 01/29/2018 0725   EOSABS 0.2 01/29/2018 0725   BASOSABS 0.0 01/29/2018 0725   Iron/TIBC/Ferritin/ %Sat    Component Value Date/Time   IRON 54 01/29/2018 0725   FERRITIN 15.1 01/29/2018 0725   IRONPCTSAT 15.0 (L) 01/29/2018 0725   Lipid Panel     Component Value Date/Time   CHOL 214 (H) 01/31/2019 0810   CHOL 223 (H) 08/23/2018 0922   TRIG 124.0 01/31/2019 0810  HDL 48.30 01/31/2019 0810   HDL 58 08/23/2018 0922   CHOLHDL 4 01/31/2019 0810   VLDL 24.8 01/31/2019 0810   LDLCALC 141 (H) 01/31/2019 0810   LDLCALC 147 (H) 08/23/2018 0922   LDLDIRECT 103.6 08/23/2012 1553   Hepatic Function Panel     Component Value Date/Time   PROT 6.4 01/31/2019 0810   PROT 7.1 08/23/2018 0922   ALBUMIN 3.8 01/31/2019 0810   ALBUMIN 4.6 08/23/2018 0922   AST 10 01/31/2019 0810   ALT 9 01/31/2019 0810   ALKPHOS 62 01/31/2019 0810   BILITOT 0.5 01/31/2019 0810   BILITOT 0.3 08/23/2018 0922   BILIDIR 0.1 12/15/2017 0912   IBILI 0.2 09/13/2013 1546      Component Value Date/Time   TSH 0.902 03/27/2018 1158   TSH 1.67 12/15/2017 0912   TSH 1.42 02/19/2016 1109     Ref. Range 08/23/2018 09:22  Vitamin D, 25-Hydroxy Latest Ref Range: 30.0 - 100.0 ng/mL 53.2   I, Doreene Nest, am acting as Location manager for Abby Potash, PA-C I, Abby Potash, PA-C have reviewed above note and agree with its content

## 2019-04-21 ENCOUNTER — Telehealth: Payer: Self-pay | Admitting: Family Medicine

## 2019-04-21 DIAGNOSIS — IMO0002 Reserved for concepts with insufficient information to code with codable children: Secondary | ICD-10-CM

## 2019-04-21 DIAGNOSIS — E1151 Type 2 diabetes mellitus with diabetic peripheral angiopathy without gangrene: Secondary | ICD-10-CM

## 2019-04-23 ENCOUNTER — Telehealth: Payer: Self-pay | Admitting: Family Medicine

## 2019-04-23 NOTE — Telephone Encounter (Signed)
lantus 15 u sq daily  3 months Recheck labs 3 months with ov here in office

## 2019-04-23 NOTE — Telephone Encounter (Signed)
Pt states she got a letter from her insurance stating that Insulin Glargine (BASAGLAR KWIKPEN) 100 UNIT/ML SOPN would no longer be covered and she needs to be switched to Lantus.  Pt would like to go ahead and make that request. Pt uses  [facility], [place] [phone] (Phone) [phone] (Fax)

## 2019-04-23 NOTE — Telephone Encounter (Signed)
Pt calling and wants to know why this medication wasn't given as a 90 day supply.

## 2019-04-23 NOTE — Telephone Encounter (Signed)
Please advise 

## 2019-04-23 NOTE — Telephone Encounter (Signed)
Pt overdue for visit. 

## 2019-04-29 MED ORDER — LANTUS SOLOSTAR 100 UNIT/ML ~~LOC~~ SOPN: 15.0000 [IU] | PEN_INJECTOR | Freq: Every day | SUBCUTANEOUS | 99 refills | Status: DC

## 2019-04-29 NOTE — Telephone Encounter (Signed)
Medication sent in. 

## 2019-05-02 ENCOUNTER — Telehealth: Payer: Self-pay

## 2019-05-02 NOTE — Telephone Encounter (Signed)
PA cancelled. Already covered.   This medication or product is on your plan's list of covered drugs. Prior authorization is not required at this time. If your pharmacy has questions regarding the processing of your prescription, please have them call the [facility] pharmacy help desk at ([phone]. **Please note: Formulary lowering, tiering exception, cost reduction and prospective Medicare hospice reviews cannot be requested using this method of submission. Please contact us at [phone] instead.

## 2019-05-02 NOTE — Telephone Encounter (Signed)
Copied from [place] [phone]. Topic: Quick Communication - See Telephone Encounter >> May 02, 2019 12:25 PM Erick Blinks wrote: CRM for notification. See Telephone encounter for: 04/23/19.  Pt needs authorization for medication with her insurance [facility] please advise

## 2019-05-02 NOTE — Telephone Encounter (Signed)
PA initiated via Covermymeds; KEY: [id number]. Awaiting determination.

## 2019-05-02 NOTE — Telephone Encounter (Signed)
.  Pt called back to report that pharmacy cannot give her this medication until we send prescription to [facility] for authorization either verbally or via fax. Please advise

## 2019-05-02 NOTE — Telephone Encounter (Signed)
Will attempt PA

## 2019-05-02 NOTE — Telephone Encounter (Signed)
Patient stated that the pharmacy will not let her pick up the Insulin Glargine (LANTUS SOLOSTAR) 100 UNIT/ML Solostar Pen medication until 05/31/2019 but her old medication will run out on 05/14/19.  So she does not understand what she is suppose to do about the 2 week lapse.  Please advise.

## 2019-05-07 NOTE — Telephone Encounter (Signed)
Called pharmacy and patient. Pt was able to pick up Lantus.

## 2019-05-13 ENCOUNTER — Telehealth: Payer: Self-pay | Admitting: Family Medicine

## 2019-05-13 NOTE — Telephone Encounter (Signed)
Medication: empagliflozin (JARDIANCE) 25 MG TABS tablet [[id number]]   Has the patient contacted their pharmacy? Yes  (Agent: If no, request that the patient contact the pharmacy for the refill.) (Agent: If yes, when and what did the pharmacy advise?)  Preferred Pharmacy (with phone number or street name): [facility] [phone] (Phone) [phone] (F  Agent: Please be advised that RX refills may take up to 3 business days. We ask that you follow-up with your pharmacy.

## 2019-05-14 ENCOUNTER — Other Ambulatory Visit: Payer: Self-pay

## 2019-05-14 DIAGNOSIS — E119 Type 2 diabetes mellitus without complications: Secondary | ICD-10-CM

## 2019-05-14 MED ORDER — JARDIANCE 25 MG PO TABS: 25.0000 mg | ORAL_TABLET | Freq: Every day | ORAL | 3 refills | Status: DC

## 2019-05-14 NOTE — Telephone Encounter (Signed)
Rx sent 

## 2019-05-28 ENCOUNTER — Other Ambulatory Visit: Payer: Self-pay

## 2019-05-30 ENCOUNTER — Ambulatory Visit (INDEPENDENT_AMBULATORY_CARE_PROVIDER_SITE_OTHER): Payer: 59 | Admitting: Family Medicine

## 2019-05-30 ENCOUNTER — Other Ambulatory Visit: Payer: Self-pay

## 2019-05-30 ENCOUNTER — Encounter: Payer: Self-pay | Admitting: Family Medicine

## 2019-05-30 VITALS — BP 133/63 | HR 64 | Temp 98.2°F | Resp 18 | Ht 68.0 in | Wt 205.6 lb

## 2019-05-30 DIAGNOSIS — E785 Hyperlipidemia, unspecified: Secondary | ICD-10-CM

## 2019-05-30 DIAGNOSIS — I10 Essential (primary) hypertension: Secondary | ICD-10-CM | POA: Diagnosis not present

## 2019-05-30 DIAGNOSIS — E1151 Type 2 diabetes mellitus with diabetic peripheral angiopathy without gangrene: Secondary | ICD-10-CM

## 2019-05-30 DIAGNOSIS — E559 Vitamin D deficiency, unspecified: Secondary | ICD-10-CM

## 2019-05-30 DIAGNOSIS — E1169 Type 2 diabetes mellitus with other specified complication: Secondary | ICD-10-CM

## 2019-05-30 DIAGNOSIS — E119 Type 2 diabetes mellitus without complications: Secondary | ICD-10-CM

## 2019-05-30 DIAGNOSIS — E1165 Type 2 diabetes mellitus with hyperglycemia: Secondary | ICD-10-CM

## 2019-05-30 DIAGNOSIS — Z23 Encounter for immunization: Secondary | ICD-10-CM | POA: Diagnosis not present

## 2019-05-30 DIAGNOSIS — IMO0002 Reserved for concepts with insufficient information to code with codable children: Secondary | ICD-10-CM

## 2019-05-30 MED ORDER — LANTUS SOLOSTAR 100 UNIT/ML ~~LOC~~ SOPN: 15.0000 [IU] | PEN_INJECTOR | Freq: Every day | SUBCUTANEOUS | 99 refills | Status: DC

## 2019-05-30 MED ORDER — LISINOPRIL-HYDROCHLOROTHIAZIDE 10-12.5 MG PO TABS: 1.0000 | ORAL_TABLET | Freq: Every day | ORAL | 1 refills | Status: DC

## 2019-05-30 MED ORDER — VITAMIN D (ERGOCALCIFEROL) 1.25 MG (50000 UNIT) PO CAPS: ORAL_CAPSULE | ORAL | 2 refills | Status: DC

## 2019-05-30 MED ORDER — JARDIANCE 25 MG PO TABS: 25.0000 mg | ORAL_TABLET | Freq: Every day | ORAL | 3 refills | Status: DC

## 2019-05-30 MED ORDER — METFORMIN HCL 1000 MG PO TABS: 1000.0000 mg | ORAL_TABLET | Freq: Two times a day (BID) | ORAL | 1 refills | Status: DC

## 2019-05-30 NOTE — Assessment & Plan Note (Signed)
hgba1c to be checked, minimize simple carbs. Increase exercise as tolerated. Continue current meds  

## 2019-05-30 NOTE — Progress Notes (Signed)
-Patient ID: Kathleen Acevedo, female    DOB: 03-Jul-1961  Age: 58 y.o. MRN: [id number]    Subjective:  Subjective  HPI Kathleen Acevedo presents for f/u dm, chol and bp  HYPERTENSION   Blood pressure range-not checking   Chest pain- no      Dyspnea- no Lightheadedness- no   Edema- no  Other side effects - no   Medication compliance: good Low salt diet- yes    DIABETES    Blood Sugar ranges-running high but coming down   Polyuria- no New Visual problems- no  Hypoglycemic symptoms- no  Other side effects-no Medication compliance - good Last eye exam- due Foot exam- today   HYPERLIPIDEMIA  Medication compliance- good RUQ pain- no  Muscle aches- no Other side effects-no      Review of Systems  Constitutional: Negative for appetite change, diaphoresis, fatigue and unexpected weight change.  Eyes: Negative for pain, redness and visual disturbance.  Respiratory: Negative for cough, chest tightness, shortness of breath and wheezing.   Cardiovascular: Negative for chest pain, palpitations and leg swelling.  Endocrine: Negative for cold intolerance, heat intolerance, polydipsia, polyphagia and polyuria.  Genitourinary: Negative for difficulty urinating, dysuria and frequency.  Neurological: Negative for dizziness, light-headedness, numbness and headaches.    History Past Medical History:  Diagnosis Date  . Diabetes mellitus   . Hyperlipidemia   . Hypertension     She has a past surgical history that includes Abdominal hysterectomy; Oophorectomy; and Breast biopsy (Left).   Her family history includes Diabetes in her father; High blood pressure in her father; Obesity in her father.She reports that she has never smoked. She has never used smokeless tobacco. She reports that she does not drink alcohol or use drugs.  Current Outpatient Medications on File Prior to Visit  Medication Sig Dispense Refill  . ferrous sulfate 325 (65 FE) MG tablet Take 1 tablet (325 mg total) by mouth  2 (two) times daily.    [name] glucose blood (ONE TOUCH ULTRA TEST) test strip Blood sugars to be checked 2 times daily 200 each 1  . ONETOUCH DELICA LANCETS 98P MISC 1 Stick by Does not apply route 4 (four) times daily. 100 each 6  . rosuvastatin (CRESTOR) 5 MG tablet Take 1 tablet (5 mg total) by mouth daily. 30 tablet 2   No current facility-administered medications on file prior to visit.      Objective:  Objective  Physical Exam Vitals signs and nursing note reviewed.  Constitutional:      Appearance: She is well-developed.  HENT:     Head: Normocephalic and atraumatic.  Eyes:     Conjunctiva/sclera: Conjunctivae normal.  Neck:     Musculoskeletal: Normal range of motion and neck supple.     Thyroid: No thyromegaly.     Vascular: No carotid bruit or JVD.  Cardiovascular:     Rate and Rhythm: Normal rate and regular rhythm.     Heart sounds: Normal heart sounds. No murmur.  Pulmonary:     Effort: Pulmonary effort is normal. No respiratory distress.     Breath sounds: Normal breath sounds. No wheezing or rales.  Chest:     Chest wall: No tenderness.  Neurological:     Mental Status: She is alert and oriented to person, place, and time.    Diabetic Foot Exam - Simple   Simple Foot Form Diabetic Foot exam was performed with the following findings: Yes 05/30/2019 10:37 AM  Visual Inspection No deformities, no  ulcerations, no other skin breakdown bilaterally: Yes Sensation Testing Intact to touch and monofilament testing bilaterally: Yes Pulse Check Posterior Tibialis and Dorsalis pulse intact bilaterally: Yes Comments     BP 133/63 (BP Location: Left Arm, Patient Position: Sitting, Cuff Size: Normal)   Pulse 64   Temp 98.2 F (36.8 C) (Oral)   Resp 18   Ht 5\' 8"  (1.727 m)   Wt 205 lb 9.6 oz (93.3 kg)   SpO2 100%   BMI 31.26 kg/m  Wt Readings from Last 3 Encounters:  05/30/19 205 lb 9.6 oz (93.3 kg)  12/10/18 200 lb (90.7 kg)  11/26/18 201 lb (91.2 kg)     Lab  Results  Component Value Date   WBC 4.6 01/29/2018   HGB 11.8 (L) 01/29/2018   HCT 36.1 01/29/2018   PLT 222.0 01/29/2018   GLUCOSE 105 (H) 01/31/2019   CHOL 214 (H) 01/31/2019   TRIG 124.0 01/31/2019   HDL 48.30 01/31/2019   LDLDIRECT 103.6 08/23/2012   LDLCALC 141 (H) 01/31/2019   ALT 9 01/31/2019   AST 10 01/31/2019   NA 140 01/31/2019   K 4.5 01/31/2019   CL 103 01/31/2019   CREATININE 1.08 01/31/2019   BUN 26 (H) 01/31/2019   CO2 29 01/31/2019   TSH 0.902 03/27/2018   HGBA1C 7.7 (H) 01/31/2019   MICROALBUR 1.2 07/02/2010    Mm 3d Screen Breast Bilateral  Result Date: 08/07/2018 CLINICAL DATA:  Screening. EXAM: DIGITAL SCREENING BILATERAL MAMMOGRAM WITH TOMO AND CAD COMPARISON:  Previous exam(s). ACR Breast Density Category b: There are scattered areas of fibroglandular density. FINDINGS: There are no findings suspicious for malignancy. Images were processed with CAD. IMPRESSION: No mammographic evidence of malignancy. A result letter of this screening mammogram will be mailed directly to the patient. RECOMMENDATION: Screening mammogram in one year. (Code:SM-B-01Y) BI-RADS CATEGORY  1: Negative. Electronically Signed   By: Claudie Revering M.D.   On: 08/07/2018 12:54     Assessment & Plan:  Plan  I have discontinued [place]. I have also changed her lisinopril-hydrochlorothiazide. Additionally, I am having her maintain her ferrous sulfate, glucose blood, OneTouch Delica Lancets 93A, rosuvastatin, metFORMIN, Jardiance, Vitamin D (Ergocalciferol), and Lantus SoloStar.  Meds ordered this encounter  Medications  . metFORMIN (GLUCOPHAGE) 1000 MG tablet    Sig: Take 1 tablet (1,000 mg total) by mouth 2 (two) times daily.    Dispense:  180 tablet    Refill:  1    Requested drug refills are authorized, however, the patient needs further evaluation and/or laboratory testing before further refills are given. Ask her to make an appointment for this.  .  empagliflozin (JARDIANCE) 25 MG TABS tablet    Sig: Take 25 mg by mouth daily.    Dispense:  30 tablet    Refill:  3  . Vitamin D, Ergocalciferol, (DRISDOL) 1.25 MG (50000 UT) CAPS capsule    Sig: Take  One every other week    Dispense:  12 capsule    Refill:  2  . lisinopril-hydrochlorothiazide (ZESTORETIC) 10-12.5 MG tablet    Sig: Take 1 tablet by mouth daily.    Dispense:  90 tablet    Refill:  1  . Insulin Glargine (LANTUS SOLOSTAR) 100 UNIT/ML Solostar Pen    Sig: Inject 15 Units into the skin daily.    Dispense:  5 pen    Refill:  PRN    Problem List Items Addressed This Visit  Unprioritized   DM (diabetes mellitus) type II uncontrolled, periph vascular disorder ([place])    hgba1c to be checked , minimize simple carbs. Increase exercise as tolerated. Continue current meds       Relevant Medications   metFORMIN (GLUCOPHAGE) 1000 MG tablet   empagliflozin (JARDIANCE) 25 MG TABS tablet   lisinopril-hydrochlorothiazide (ZESTORETIC) 10-12.5 MG tablet   Insulin Glargine (LANTUS SOLOSTAR) 100 UNIT/ML Solostar Pen   Other Relevant Orders   Hemoglobin A1c   Essential hypertension    Well controlled, no changes to meds. Encouraged heart healthy diet such as the DASH diet and exercise as tolerated.       Relevant Medications   lisinopril-hydrochlorothiazide (ZESTORETIC) 10-12.5 MG tablet   Hyperlipidemia - Primary   Relevant Medications   lisinopril-hydrochlorothiazide (ZESTORETIC) 10-12.5 MG tablet   Other Relevant Orders   Lipid panel   Comprehensive metabolic panel   Hyperlipidemia associated with type 2 diabetes mellitus ([place])    Encouraged heart healthy diet, increase exercise, avoid trans fats, consider a krill oil cap daily      Relevant Medications   metFORMIN (GLUCOPHAGE) 1000 MG tablet   empagliflozin (JARDIANCE) 25 MG TABS tablet   lisinopril-hydrochlorothiazide (ZESTORETIC) 10-12.5 MG tablet   Insulin Glargine (LANTUS SOLOSTAR) 100 UNIT/ML Solostar Pen     Other Visit Diagnoses    Type 2 diabetes mellitus without complication, without long-term current use of insulin ([facility])       Relevant Medications   metFORMIN (GLUCOPHAGE) 1000 MG tablet   empagliflozin (JARDIANCE) 25 MG TABS tablet   lisinopril-hydrochlorothiazide (ZESTORETIC) 10-12.5 MG tablet   Insulin Glargine (LANTUS SOLOSTAR) 100 UNIT/ML Solostar Pen   Other Relevant Orders   Lipid panel   Hemoglobin A1c   Comprehensive metabolic panel   Vitamin D deficiency       Relevant Medications   Vitamin D, Ergocalciferol, (DRISDOL) 1.25 MG (50000 UT) CAPS capsule   Other Relevant Orders   Vitamin D (25 hydroxy)   Need for pneumococcal vaccination       Relevant Orders   Pneumococcal polysaccharide vaccine 23-valent greater than or equal to 2yo subcutaneous/IM (Completed)      Follow-up: Return in about 6 months (around 11/30/2019), or if symptoms worsen or fail to improve, for hypertension, hyperlipidemia, diabetes II, annual exam, fasting.  Ann Held, DO

## 2019-05-30 NOTE — Patient Instructions (Signed)
Carbohydrate Counting for Diabetes Mellitus, Adult  Carbohydrate counting is a method of keeping track of how many carbohydrates you eat. Eating carbohydrates naturally increases the amount of sugar (glucose) in the blood. Counting how many carbohydrates you eat helps keep your blood glucose within normal limits, which helps you manage your diabetes (diabetes mellitus). It is important to know how many carbohydrates you can safely have in each meal. This is different for every person. A diet and nutrition specialist (registered dietitian) can help you make a meal plan and calculate how many carbohydrates you should have at each meal and snack. Carbohydrates are found in the following foods:  Grains, such as breads and cereals.  Dried beans and soy products.  Starchy vegetables, such as potatoes, peas, and corn.  Fruit and fruit juices.  Milk and yogurt.  Sweets and snack foods, such as cake, cookies, candy, chips, and soft drinks. How do I count carbohydrates? There are two ways to count carbohydrates in food. You can use either of the methods or a combination of both. Reading "Nutrition Facts" on packaged food The "Nutrition Facts" list is included on the labels of almost all packaged foods and beverages in the [place] It includes:  The serving size.  Information about nutrients in each serving, including the grams (g) of carbohydrate per serving. To use the "Nutrition Facts":  Decide how many servings you will have.  Multiply the number of servings by the number of carbohydrates per serving.  The resulting number is the total amount of carbohydrates that you will be having. Learning standard serving sizes of other foods When you eat carbohydrate foods that are not packaged or do not include "Nutrition Facts" on the label, you need to measure the servings in order to count the amount of carbohydrates:  Measure the foods that you will eat with a food scale or measuring cup, if needed.   Decide how many standard-size servings you will eat.  Multiply the number of servings by 15. Most carbohydrate-rich foods have about 15 g of carbohydrates per serving. ? For example, if you eat 8 oz (170 g) of strawberries, you will have eaten 2 servings and 30 g of carbohydrates (2 servings x 15 g = 30 g).  For foods that have more than one food mixed, such as soups and casseroles, you must count the carbohydrates in each food that is included. The following list contains standard serving sizes of common carbohydrate-rich foods. Each of these servings has about 15 g of carbohydrates:   hamburger bun or  English muffin.   oz (15 mL) syrup.   oz (14 g) jelly.  1 slice of bread.  1 six-inch tortilla.  3 oz (85 g) cooked rice or pasta.  4 oz (113 g) cooked dried beans.  4 oz (113 g) starchy vegetable, such as peas, corn, or potatoes.  4 oz (113 g) hot cereal.  4 oz (113 g) mashed potatoes or  of a large baked potato.  4 oz (113 g) canned or frozen fruit.  4 oz (120 mL) fruit juice.  4-6 crackers.  6 chicken nuggets.  6 oz (170 g) unsweetened dry cereal.  6 oz (170 g) plain fat-free yogurt or yogurt sweetened with artificial sweeteners.  8 oz (240 mL) milk.  8 oz (170 g) fresh fruit or one small piece of fruit.  24 oz (680 g) popped popcorn. Example of carbohydrate counting Sample meal  3 oz (85 g) chicken breast.  6 oz (170 g)   brown rice.  4 oz (113 g) corn.  8 oz (240 mL) milk.  8 oz (170 g) strawberries with sugar-free whipped topping. Carbohydrate calculation 1. Identify the foods that contain carbohydrates: ? Rice. ? Corn. ? Milk. ? Strawberries. 2. Calculate how many servings you have of each food: ? 2 servings rice. ? 1 serving corn. ? 1 serving milk. ? 1 serving strawberries. 3. Multiply each number of servings by 15 g: ? 2 servings rice x 15 g = 30 g. ? 1 serving corn x 15 g = 15 g. ? 1 serving milk x 15 g = 15 g. ? 1 serving  strawberries x 15 g = 15 g. 4. Add together all of the amounts to find the total grams of carbohydrates eaten: ? 30 g + 15 g + 15 g + 15 g = 75 g of carbohydrates total. Summary  Carbohydrate counting is a method of keeping track of how many carbohydrates you eat.  Eating carbohydrates naturally increases the amount of sugar (glucose) in the blood.  Counting how many carbohydrates you eat helps keep your blood glucose within normal limits, which helps you manage your diabetes.  A diet and nutrition specialist (registered dietitian) can help you make a meal plan and calculate how many carbohydrates you should have at each meal and snack. This information is not intended to replace advice given to you by your health care provider. Make sure you discuss any questions you have with your health care provider. Document Released: 10/17/2005 Document Revised: 05/11/2017 Document Reviewed: 03/30/2016 Elsevier Patient Education  2020 Elsevier Inc.  

## 2019-05-30 NOTE — Assessment & Plan Note (Signed)
Well controlled, no changes to meds. Encouraged heart healthy diet such as the DASH diet and exercise as tolerated.  °

## 2019-05-30 NOTE — Assessment & Plan Note (Signed)
Encouraged heart healthy diet, increase exercise, avoid trans fats, consider a krill oil cap daily 

## 2019-05-31 ENCOUNTER — Ambulatory Visit: Payer: 59 | Admitting: Family Medicine

## 2019-06-03 ENCOUNTER — Ambulatory Visit: Payer: 59 | Admitting: Family Medicine

## 2019-06-05 ENCOUNTER — Other Ambulatory Visit: Payer: 59

## 2019-06-06 ENCOUNTER — Ambulatory Visit: Payer: 59 | Admitting: Family Medicine

## 2019-06-07 ENCOUNTER — Other Ambulatory Visit: Payer: Self-pay

## 2019-06-07 ENCOUNTER — Other Ambulatory Visit (INDEPENDENT_AMBULATORY_CARE_PROVIDER_SITE_OTHER): Payer: 59

## 2019-06-07 DIAGNOSIS — E1151 Type 2 diabetes mellitus with diabetic peripheral angiopathy without gangrene: Secondary | ICD-10-CM

## 2019-06-07 DIAGNOSIS — E1165 Type 2 diabetes mellitus with hyperglycemia: Secondary | ICD-10-CM

## 2019-06-07 DIAGNOSIS — E785 Hyperlipidemia, unspecified: Secondary | ICD-10-CM | POA: Diagnosis not present

## 2019-06-07 DIAGNOSIS — E559 Vitamin D deficiency, unspecified: Secondary | ICD-10-CM | POA: Diagnosis not present

## 2019-06-07 DIAGNOSIS — E119 Type 2 diabetes mellitus without complications: Secondary | ICD-10-CM

## 2019-06-07 DIAGNOSIS — IMO0002 Reserved for concepts with insufficient information to code with codable children: Secondary | ICD-10-CM

## 2019-06-07 LAB — COMPREHENSIVE METABOLIC PANEL
ALT: 13 U/L (ref 0–35)
AST: 13 U/L (ref 0–37)
Albumin: 4 g/dL (ref 3.5–5.2)
Alkaline Phosphatase: 57 U/L (ref 39–117)
BUN: 30 mg/dL — ABNORMAL HIGH (ref 6–23)
CO2: 28 mEq/L (ref 19–32)
Calcium: 9.5 mg/dL (ref 8.4–10.5)
Chloride: 104 mEq/L (ref 96–112)
Creatinine, Ser: 1.07 mg/dL (ref 0.40–1.20)
GFR: 63.32 mL/min (ref >60.00)
Glucose, Bld: 84 mg/dL (ref 70–99)
Potassium: 4.2 mEq/L (ref 3.5–5.1)
Sodium: 140 mEq/L (ref 135–145)
Total Bilirubin: 0.4 mg/dL (ref 0.2–1.2)
Total Protein: 6.6 g/dL (ref 6.0–8.3)

## 2019-06-07 LAB — LIPID PANEL
Cholesterol: 154 mg/dL (ref 0–200)
HDL: 53.2 mg/dL (ref >39.00)
LDL Cholesterol: 81 mg/dL (ref 0–99)
NonHDL: 100.65
Total CHOL/HDL Ratio: 3
Triglycerides: 99 mg/dL (ref 0.0–149.0)
VLDL: 19.8 mg/dL (ref 0.0–40.0)

## 2019-06-07 LAB — HEMOGLOBIN A1C: Hgb A1c MFr Bld: 7.6 % — ABNORMAL HIGH (ref 4.6–6.5)

## 2019-06-07 LAB — VITAMIN D 25 HYDROXY (VIT D DEFICIENCY, FRACTURES): VITD: 52.56 ng/mL (ref 30.00–100.00)

## 2019-06-10 ENCOUNTER — Other Ambulatory Visit: Payer: Self-pay | Admitting: Family Medicine

## 2019-06-10 DIAGNOSIS — E1165 Type 2 diabetes mellitus with hyperglycemia: Secondary | ICD-10-CM

## 2019-06-10 DIAGNOSIS — I10 Essential (primary) hypertension: Secondary | ICD-10-CM

## 2019-06-10 DIAGNOSIS — E1169 Type 2 diabetes mellitus with other specified complication: Secondary | ICD-10-CM

## 2019-06-10 DIAGNOSIS — E785 Hyperlipidemia, unspecified: Secondary | ICD-10-CM

## 2019-07-05 ENCOUNTER — Ambulatory Visit: Payer: 59

## 2019-07-09 ENCOUNTER — Telehealth: Payer: Self-pay | Admitting: Family Medicine

## 2019-07-09 NOTE — Telephone Encounter (Signed)
Copied from [place] [id number]. Topic: General - Other >> Jul 09, 2019  9:14 AM Kathleen Acevedo wrote: Reason for CRM: pt called and stated that she would like to know if any empagliflozin (JARDIANCE) 25 MG TABS tablet [id number] samples are available. Pt states that she changed her insurance and this medication was $127. Pt states that she is working on getting it at a cheaper price. Pt also states that she can no longer afford Insulin Glargine (LANTUS SOLOSTAR) 100 UNIT/ML Solostar Pen [id number] and would like to know if she can get novolin pens. Please advise

## 2019-07-10 NOTE — Telephone Encounter (Signed)
Please read below. We do not have any samples but we have a discount card for Jardiance. Please advise.

## 2019-07-11 NOTE — Telephone Encounter (Signed)
novulin is different insulin---  Will her ins cover anything like lantus?  Does she have a formulary?   We do not have jardiance samples-- we may have discount coupons or she may be able to get a coupon at [email or web address]

## 2019-07-11 NOTE — Telephone Encounter (Signed)
Patient is checking on the status of the request for Novulin N Pens.  Pharmacist advised the patient with out insurance the Novulin N pen is $40 for 5 pens with out insurance.  The patient is requesting a prescription for the Novulin N pens. And would like to know what her dosage would be?   Please advise CB- [phone]

## 2019-07-12 MED ORDER — NOVOLIN N FLEXPEN 100 UNIT/ML ~~LOC~~ SUPN: 10.0000 [IU] | PEN_INJECTOR | Freq: Two times a day (BID) | SUBCUTANEOUS | 2 refills | Status: DC

## 2019-07-12 NOTE — Telephone Encounter (Signed)
novulin N  Pen #5 pens  10 u bid  To start --- check BS qid  We will adjust if running high

## 2019-07-12 NOTE — Telephone Encounter (Signed)
Pharmacist advised the patient with out insurance the Novulin N pen is $40 for 5 pens with out insurance.

## 2019-07-12 NOTE — Addendum Note (Signed)
Addended by: Sanda Linger on: 07/12/2019 03:44 PM   Modules accepted: Orders

## 2019-08-09 LAB — HM MAMMOGRAPHY

## 2019-08-26 ENCOUNTER — Other Ambulatory Visit: Payer: Self-pay | Admitting: Family Medicine

## 2019-08-26 DIAGNOSIS — E1165 Type 2 diabetes mellitus with hyperglycemia: Secondary | ICD-10-CM

## 2019-08-26 DIAGNOSIS — E1151 Type 2 diabetes mellitus with diabetic peripheral angiopathy without gangrene: Secondary | ICD-10-CM

## 2019-08-26 DIAGNOSIS — IMO0002 Reserved for concepts with insufficient information to code with codable children: Secondary | ICD-10-CM

## 2019-11-27 ENCOUNTER — Other Ambulatory Visit: Payer: Self-pay | Admitting: Family Medicine

## 2019-11-27 DIAGNOSIS — E1151 Type 2 diabetes mellitus with diabetic peripheral angiopathy without gangrene: Secondary | ICD-10-CM

## 2019-11-27 DIAGNOSIS — IMO0002 Reserved for concepts with insufficient information to code with codable children: Secondary | ICD-10-CM

## 2019-12-05 ENCOUNTER — Telehealth: Payer: Self-pay | Admitting: Family Medicine

## 2019-12-05 ENCOUNTER — Other Ambulatory Visit: Payer: Self-pay

## 2019-12-05 DIAGNOSIS — I10 Essential (primary) hypertension: Secondary | ICD-10-CM

## 2019-12-05 DIAGNOSIS — E119 Type 2 diabetes mellitus without complications: Secondary | ICD-10-CM

## 2019-12-05 MED ORDER — JARDIANCE 25 MG PO TABS: 25.0000 mg | ORAL_TABLET | Freq: Every day | ORAL | 3 refills | Status: DC

## 2019-12-05 MED ORDER — ROSUVASTATIN CALCIUM 5 MG PO TABS: 5.0000 mg | ORAL_TABLET | Freq: Every day | ORAL | 2 refills | Status: DC

## 2019-12-05 MED ORDER — LISINOPRIL-HYDROCHLOROTHIAZIDE 10-12.5 MG PO TABS: 1.0000 | ORAL_TABLET | Freq: Every day | ORAL | 1 refills | Status: DC

## 2019-12-05 NOTE — Telephone Encounter (Signed)
Refill sent.

## 2019-12-05 NOTE — Telephone Encounter (Signed)
PT had a cpe for 02/5. She's in quarantine due to Covid expso at work & she's currently having symptoms. We moved her appt to April . But Patient needs refill on meds until her next appt.    Medicationlisinopril-hydrochlorothiazide (ZESTORETIC) 10-12.5 MG tablet   empagliflozin (JARDIANCE) 25 MG TABS tablet   rosuvastatin (CRESTOR) 5 MG tablet   Has the patient contacted their pharmacy? Yes.   (If no, request that the patient contact the pharmacy for the refill.)  (If yes, when and what did the pharmacy advise?)  Preferred Pharmacy (with phone number or street name):   [facility], [place] [address]  Phone:  [phone] Fax:  [phone]   Agent: Please be advised that RX refills may take up to 3 business days. We ask that you follow-up with your pharmacy.

## 2019-12-06 ENCOUNTER — Ambulatory Visit: Payer: Self-pay | Admitting: Family Medicine

## 2019-12-06 ENCOUNTER — Encounter: Payer: 59 | Admitting: Family Medicine

## 2020-01-23 ENCOUNTER — Ambulatory Visit: Payer: Self-pay | Attending: Internal Medicine

## 2020-01-23 ENCOUNTER — Other Ambulatory Visit: Payer: Self-pay | Admitting: Family Medicine

## 2020-01-23 DIAGNOSIS — Z23 Encounter for immunization: Secondary | ICD-10-CM

## 2020-01-23 NOTE — Progress Notes (Signed)
   Covid-19 Vaccination Clinic  Name:  ARSENIA STOLZENBURG    MRN: [id number] DOB: February 01, 1961  01/23/2020  Ms. Carkhuff was observed post Covid-19 immunization for 15 minutes without incident. She was provided with Vaccine Information Sheet and instruction to access the V-Safe system.   Ms. Gavilanes was instructed to call 911 with any severe reactions post vaccine: [name] Difficulty breathing  . Swelling of face and throat  . A fast heartbeat  . A bad rash all over body  . Dizziness and weakness   Immunizations Administered    Name Date Dose VIS Date Route   Pfizer COVID-19 Vaccine 01/23/2020 12:51 PM 0.3 mL 10/11/2019 Intramuscular   Manufacturer: [place]   Lot: [id number]   [place]: [id number]

## 2020-01-23 NOTE — Telephone Encounter (Signed)
Medication:empagliflozin (JARDIANCE) 25 MG TABS tablet [id number]   Has the patient contacted their pharmacy? No. (If no, request that the patient contact the pharmacy for the refill.) (If yes, when and what did the pharmacy advise?)  Preferred Pharmacy (with phone number or street name): [facility], [place] [address]  Phone:  [phone] Fax:  [phone]   Agent: Please be advised that RX refills may take up to 3 business days. We ask that you follow-up with your pharmacy.

## 2020-01-23 NOTE — Telephone Encounter (Signed)
Patient did not need refills.  She stated that she just panic because they told her that it was $118.  She will call pharmacy back and ask them why it is so much.  Advised that she can print coupons from online.

## 2020-02-14 ENCOUNTER — Other Ambulatory Visit: Payer: Self-pay

## 2020-02-14 ENCOUNTER — Ambulatory Visit (INDEPENDENT_AMBULATORY_CARE_PROVIDER_SITE_OTHER): Payer: PRIVATE HEALTH INSURANCE | Admitting: Family Medicine

## 2020-02-14 ENCOUNTER — Encounter: Payer: Self-pay | Admitting: Family Medicine

## 2020-02-14 VITALS — BP 142/88 | HR 59 | Temp 97.0°F | Ht 68.0 in | Wt 213.4 lb

## 2020-02-14 DIAGNOSIS — IMO0002 Reserved for concepts with insufficient information to code with codable children: Secondary | ICD-10-CM

## 2020-02-14 DIAGNOSIS — E1165 Type 2 diabetes mellitus with hyperglycemia: Secondary | ICD-10-CM | POA: Diagnosis not present

## 2020-02-14 DIAGNOSIS — E1151 Type 2 diabetes mellitus with diabetic peripheral angiopathy without gangrene: Secondary | ICD-10-CM

## 2020-02-14 DIAGNOSIS — E119 Type 2 diabetes mellitus without complications: Secondary | ICD-10-CM

## 2020-02-14 DIAGNOSIS — Z0000 Encounter for general adult medical examination without abnormal findings: Secondary | ICD-10-CM | POA: Diagnosis not present

## 2020-02-14 DIAGNOSIS — I10 Essential (primary) hypertension: Secondary | ICD-10-CM

## 2020-02-14 LAB — CBC WITH DIFFERENTIAL/PLATELET
Basophils Absolute: 0 10*3/uL (ref 0.0–0.1)
Basophils Relative: 0.6 % (ref 0.0–3.0)
Eosinophils Absolute: 0.1 10*3/uL (ref 0.0–0.7)
Eosinophils Relative: 3.8 % (ref 0.0–5.0)
HCT: 36.2 % (ref 36.0–46.0)
Hemoglobin: 12.1 g/dL (ref 12.0–15.0)
Lymphocytes Relative: 35.8 % (ref 12.0–46.0)
Lymphs Abs: 1.4 10*3/uL (ref 0.7–4.0)
MCHC: 33.6 g/dL (ref 30.0–36.0)
MCV: 89 fl (ref 78.0–100.0)
Monocytes Absolute: 0.3 10*3/uL (ref 0.1–1.0)
Monocytes Relative: 8.1 % (ref 3.0–12.0)
Neutro Abs: 2 10*3/uL (ref 1.4–7.7)
Neutrophils Relative %: 51.7 % (ref 43.0–77.0)
Platelets: 244 10*3/uL (ref 150.0–400.0)
RBC: 4.07 Mil/uL (ref 3.87–5.11)
RDW: 14.2 % (ref 11.5–15.5)
WBC: 3.8 10*3/uL — ABNORMAL LOW (ref 4.0–10.5)

## 2020-02-14 LAB — COMPREHENSIVE METABOLIC PANEL
ALT: 14 U/L (ref 0–35)
AST: 14 U/L (ref 0–37)
Albumin: 4 g/dL (ref 3.5–5.2)
Alkaline Phosphatase: 64 U/L (ref 39–117)
BUN: 22 mg/dL (ref 6–23)
CO2: 30 mEq/L (ref 19–32)
Calcium: 9.3 mg/dL (ref 8.4–10.5)
Chloride: 103 mEq/L (ref 96–112)
Creatinine, Ser: 0.96 mg/dL (ref 0.40–1.20)
GFR: 71.6 mL/min (ref >60.00)
Glucose, Bld: 72 mg/dL (ref 70–99)
Potassium: 4.2 mEq/L (ref 3.5–5.1)
Sodium: 141 mEq/L (ref 135–145)
Total Bilirubin: 0.4 mg/dL (ref 0.2–1.2)
Total Protein: 6.7 g/dL (ref 6.0–8.3)

## 2020-02-14 LAB — LIPID PANEL
Cholesterol: 168 mg/dL (ref 0–200)
HDL: 49.9 mg/dL (ref >39.00)
LDL Cholesterol: 94 mg/dL (ref 0–99)
NonHDL: 118.11
Total CHOL/HDL Ratio: 3
Triglycerides: 119 mg/dL (ref 0.0–149.0)
VLDL: 23.8 mg/dL (ref 0.0–40.0)

## 2020-02-14 LAB — MICROALBUMIN / CREATININE URINE RATIO
Creatinine,U: 66 mg/dL
Microalb Creat Ratio: 1.1 mg/g (ref 0.0–30.0)
Microalb, Ur: <0.7 mg/dL (ref 0.0–1.9)

## 2020-02-14 LAB — TSH: TSH: 1.78 u[IU]/mL (ref 0.35–4.50)

## 2020-02-14 LAB — HEMOGLOBIN A1C: Hgb A1c MFr Bld: 8.2 % — ABNORMAL HIGH (ref 4.6–6.5)

## 2020-02-14 MED ORDER — JARDIANCE 25 MG PO TABS: 25.0000 mg | ORAL_TABLET | Freq: Every day | ORAL | 3 refills | Status: DC

## 2020-02-14 MED ORDER — METFORMIN HCL 1000 MG PO TABS: ORAL_TABLET | ORAL | 1 refills | Status: DC

## 2020-02-14 MED ORDER — LISINOPRIL-HYDROCHLOROTHIAZIDE 10-12.5 MG PO TABS: 1.0000 | ORAL_TABLET | Freq: Every day | ORAL | 1 refills | Status: DC

## 2020-02-14 NOTE — Progress Notes (Addendum)
Subjective:     Kathleen Acevedo is a 59 y.o. female and is here for a comprehensive physical exam. The patient reports no problems.   HYPERTENSION   Blood pressure range-not checking   Chest pain- no      Dyspnea- no Lightheadedness- no   Edema- no  Other side effects - no   Medication compliance: good Low salt diet- yes    DIABETES    Blood Sugar ranges-100-200  Polyuria- no New Visual problems- no  Hypoglycemic symptoms- no  Other side effects-no Medication compliance - good Last eye exam- due  Foot exam- today   HYPERLIPIDEMIA  Medication compliance- good RUQ pain- no  Muscle aches- no Other side effects-no      Social History   Socioeconomic History  . Marital status: Married    Spouse name: Raygen Hullinger  . Number of children: 1  . Years of education: Not on file  . Highest education level: Not on file  Occupational History  . Occupation: Therapist, music  Tobacco Use  . Smoking status: Never Smoker  . Smokeless tobacco: Never Used  Substance and Sexual Activity  . Alcohol use: No    Alcohol/week: 0.0 standard drinks  . Drug use: No  . Sexual activity: Yes    Partners: Male  Other Topics Concern  . Not on file  Social History Narrative   Exercise --  4-5 days a week-- walking    Social Determinants of Health   Financial Resource Strain:   . Difficulty of Paying Living Expenses:   Food Insecurity:   . Worried About Charity fundraiser in the Last Year:   . Arboriculturist in the Last Year:   Transportation Needs:   . Film/video editor (Medical):   [name] Lack of Transportation (Non-Medical):   Physical Activity:   . Days of Exercise per Week:   . Minutes of Exercise per Session:   Stress:   . Feeling of Stress :   Social Connections:   . Frequency of Communication with Friends and Family:   . Frequency of Social Gatherings with Friends and Family:   . Attends Religious Services:   . Active Member of Clubs or Organizations:   . Attends English as a second language teacher Meetings:   [name] Marital Status:   Intimate Partner Violence:   . Fear of Current or Ex-Partner:   . Emotionally Abused:   [name] Physically Abused:   . Sexually Abused:    Health Maintenance  Topic Date Due  . HIV Screening  Never done  . OPHTHALMOLOGY EXAM  04/28/2018  . TETANUS/TDAP  12/01/2018  . HEMOGLOBIN A1C  12/08/2019  . FOOT EXAM  05/29/2020  . INFLUENZA VACCINE  05/31/2020  . MAMMOGRAM  08/08/2020  . COLONOSCOPY  04/20/2023  . PNEUMOCOCCAL POLYSACCHARIDE VACCINE AGE 26-64 HIGH RISK  Completed  . Hepatitis C Screening  Completed    The following portions of the patient's history were reviewed and updated as appropriate:  She  has a past medical history of Diabetes mellitus, Hyperlipidemia, and Hypertension. She does not have any pertinent problems on file. She  has a past surgical history that includes Abdominal hysterectomy; Oophorectomy; and Breast biopsy (Left). Her family history includes Diabetes in her father; High blood pressure in her father; Obesity in her father. She  reports that she has never smoked. She has never used smokeless tobacco. She reports that she does not drink alcohol or use drugs. She has a current medication list  which includes the following prescription(s): jardiance, ferrous sulfate, lantus solostar, lisinopril-hydrochlorothiazide, metformin, onetouch delica lancets [id number], onetouch ultra, rosuvastatin, and vitamin d (ergocalciferol). Current Outpatient Medications on File Prior to Visit  Medication Sig Dispense Refill  . ferrous sulfate 325 (65 FE) MG tablet Take 1 tablet (325 mg total) by mouth 2 (two) times daily.    . Insulin Glargine (LANTUS SOLOSTAR) 100 UNIT/ML Solostar Pen Inject 15 Units into the skin daily. 5 pen PRN  . ONETOUCH DELICA LANCETS [id number] MISC 1 Stick by Does not apply route 4 (four) times daily. 100 each 6  . ONETOUCH ULTRA test strip USE 1 STRIP TO CHECK GLUCOSE TWICE DAILY 200 each 0  . rosuvastatin (CRESTOR) 5 MG  tablet Take 1 tablet (5 mg total) by mouth daily. 30 tablet 2  . Vitamin D, Ergocalciferol, (DRISDOL) 1.25 MG (50000 UT) CAPS capsule Take  One every other week 12 capsule 2   No current facility-administered medications on file prior to visit.   She has No Known Allergies..  Review of Systems Review of Systems  Constitutional: Negative for activity change, appetite change and fatigue.  HENT: Negative for hearing loss, congestion, tinnitus and ear discharge.  dentist q70m Eyes: Negative for visual disturbance (see optho q1y -- vision corrected to 20/20 with glasses).  Respiratory: Negative for cough, chest tightness and shortness of breath.   Cardiovascular: Negative for chest pain, palpitations and leg swelling.  Gastrointestinal: Negative for abdominal pain, diarrhea, constipation and abdominal distention.  Genitourinary: Negative for urgency, frequency, decreased urine volume and difficulty urinating.  Musculoskeletal: Negative for back pain, arthralgias and gait problem.  Skin: Negative for color change, pallor and rash.  Neurological: Negative for dizziness, light-headedness, numbness and headaches.  Hematological: Negative for adenopathy. Does not bruise/bleed easily.  Psychiatric/Behavioral: Negative for suicidal ideas, confusion, sleep disturbance, self-injury, dysphoric mood, decreased concentration and agitation.       Objective:    BP (!) 142/88 (BP Location: Right Arm, Patient Position: Sitting, Cuff Size: Normal)   Pulse (!) 59   Temp (!) 97 F (36.1 C) (Temporal)   Ht 5\' 8"  (1.727 m)   Wt 213 lb 6 oz (96.8 kg)   SpO2 96%   BMI 32.44 kg/m  General appearance: alert, cooperative, appears stated age and no distress Head: Normocephalic, without obvious abnormality, atraumatic Eyes: conjunctivae/corneas clear. PERRL, EOM's intact. Fundi benign. Ears: normal TM's and external ear canals both ears Neck: no adenopathy, no carotid bruit, no JVD, supple, symmetrical,  trachea midline and thyroid not enlarged, symmetric, no tenderness/mass/nodules Back: symmetric, no curvature. ROM normal. No CVA tenderness. Lungs: clear to auscultation bilaterally Breasts: normal appearance, no masses or tenderness Heart: regular rate and rhythm, S1, S2 normal, no murmur, click, rub or gallop Abdomen: soft, non-tender; bowel sounds normal; no masses,  no organomegaly Pelvic: deferred and not indicated; status post hysterectomy, negative ROS Extremities: extremities normal, atraumatic, no cyanosis or edema Pulses: 2+ and symmetric Skin: Skin color, texture, turgor normal. No rashes or lesions Lymph nodes: Cervical, supraclavicular, and axillary nodes normal. Neurologic: Alert and oriented X 3, normal strength and tone. Normal symmetric reflexes. Normal coordination and gait    Assessment:    Healthy female exam.      Plan:    ghm utd Check labs See After Visit Summary for Counseling Recommendations   1. DM (diabetes mellitus) type II uncontrolled, periph vascular disorder ([place]) hgba1c to be checked , minimize simple carbs. Increase exercise as tolerated. Continue current meds  - metFORMIN (  GLUCOPHAGE) 1000 MG tablet; TAKE 1 TABLET BY MOUTH TWICE DAILY .  Dispense: 180 tablet; Refill: 1 - Lipid panel - Hemoglobin A1c - Comprehensive metabolic panel - Microalbumin / creatinine urine ratio  2. Essential hypertension Well controlled, no changes to meds. Encouraged heart healthy diet such as the DASH diet and exercise as tolerated.   - lisinopril-hydrochlorothiazide (ZESTORETIC) 10-12.5 MG tablet; Take 1 tablet by mouth daily.  Dispense: 90 tablet; Refill: 1 - Lipid panel - Comprehensive metabolic panel - CBC with Differential/Platelet - Microalbumin / creatinine urine ratio  3. Type 2 diabetes mellitus without complication, without long-term current use of insulin (HCC) hgba1c to be checked , minimize simple carbs. Increase exercise as tolerated. Continue  current meds  - empagliflozin (JARDIANCE) 25 MG TABS tablet; Take 25 mg by mouth daily.  Dispense: 90 tablet; Refill: 3  4. Preventative health care ghm utd Check labs  See AVS - Lipid panel - Hemoglobin A1c - Comprehensive metabolic panel - CBC with Differential/Platelet - Microalbumin / creatinine urine ratio - TSH

## 2020-02-14 NOTE — Patient Instructions (Signed)

## 2020-02-18 ENCOUNTER — Ambulatory Visit: Payer: Self-pay

## 2020-02-18 ENCOUNTER — Ambulatory Visit: Payer: PRIVATE HEALTH INSURANCE | Attending: Internal Medicine

## 2020-02-18 DIAGNOSIS — Z23 Encounter for immunization: Secondary | ICD-10-CM

## 2020-02-18 NOTE — Progress Notes (Signed)
   Covid-19 Vaccination Clinic  Name:  [name]    MRN: [id number] DOB: [birth year]  02/18/2020  Kathleen Acevedo was observed post Covid-19 immunization for 15 minutes without incident. She was provided with Vaccine Information Sheet and instruction to access the V-Safe system.   [name] was instructed to call 911 with any severe reactions post vaccine: [name] Difficulty breathing  . Swelling of face and throat  . A fast heartbeat  . A bad rash all over body  . Dizziness and weakness   Immunizations Administered    Name Date Dose VIS Date Route   Pfizer COVID-19 Vaccine 02/18/2020 12:31 PM 0.3 mL 12/25/2018 Intramuscular   Manufacturer: [place]   Lot: [id number]   [place]: [id number]

## 2020-06-02 ENCOUNTER — Encounter: Payer: Self-pay | Admitting: Family Medicine

## 2020-06-02 ENCOUNTER — Ambulatory Visit: Payer: PRIVATE HEALTH INSURANCE | Admitting: Family Medicine

## 2020-06-02 ENCOUNTER — Other Ambulatory Visit: Payer: Self-pay

## 2020-06-02 VITALS — BP 130/80 | HR 63 | Temp 98.3°F | Resp 18 | Ht 68.0 in | Wt 222.8 lb

## 2020-06-02 DIAGNOSIS — E559 Vitamin D deficiency, unspecified: Secondary | ICD-10-CM

## 2020-06-02 DIAGNOSIS — E785 Hyperlipidemia, unspecified: Secondary | ICD-10-CM | POA: Diagnosis not present

## 2020-06-02 DIAGNOSIS — E1165 Type 2 diabetes mellitus with hyperglycemia: Secondary | ICD-10-CM

## 2020-06-02 DIAGNOSIS — IMO0002 Reserved for concepts with insufficient information to code with codable children: Secondary | ICD-10-CM

## 2020-06-02 DIAGNOSIS — E1151 Type 2 diabetes mellitus with diabetic peripheral angiopathy without gangrene: Secondary | ICD-10-CM

## 2020-06-02 DIAGNOSIS — E119 Type 2 diabetes mellitus without complications: Secondary | ICD-10-CM | POA: Diagnosis not present

## 2020-06-02 DIAGNOSIS — E1169 Type 2 diabetes mellitus with other specified complication: Secondary | ICD-10-CM

## 2020-06-02 DIAGNOSIS — I10 Essential (primary) hypertension: Secondary | ICD-10-CM

## 2020-06-02 LAB — COMPREHENSIVE METABOLIC PANEL
ALT: 19 U/L (ref 0–35)
AST: 19 U/L (ref 0–37)
Albumin: 3.9 g/dL (ref 3.5–5.2)
Alkaline Phosphatase: 53 U/L (ref 39–117)
BUN: 26 mg/dL — ABNORMAL HIGH (ref 6–23)
CO2: 29 mEq/L (ref 19–32)
Calcium: 9.5 mg/dL (ref 8.4–10.5)
Chloride: 104 mEq/L (ref 96–112)
Creatinine, Ser: 1.09 mg/dL (ref 0.40–1.20)
GFR: 61.78 mL/min (ref >60.00)
Glucose, Bld: 98 mg/dL (ref 70–99)
Potassium: 4.3 mEq/L (ref 3.5–5.1)
Sodium: 138 mEq/L (ref 135–145)
Total Bilirubin: 0.3 mg/dL (ref 0.2–1.2)
Total Protein: 6.4 g/dL (ref 6.0–8.3)

## 2020-06-02 LAB — LIPID PANEL
Cholesterol: 138 mg/dL (ref 0–200)
HDL: 45.3 mg/dL (ref >39.00)
LDL Cholesterol: 68 mg/dL (ref 0–99)
NonHDL: 93.15
Total CHOL/HDL Ratio: 3
Triglycerides: 127 mg/dL (ref 0.0–149.0)
VLDL: 25.4 mg/dL (ref 0.0–40.0)

## 2020-06-02 LAB — HEMOGLOBIN A1C: Hgb A1c MFr Bld: 7.8 % — ABNORMAL HIGH (ref 4.6–6.5)

## 2020-06-02 MED ORDER — VITAMIN D (ERGOCALCIFEROL) 1.25 MG (50000 UNIT) PO CAPS: ORAL_CAPSULE | ORAL | 2 refills | Status: DC

## 2020-06-02 MED ORDER — LISINOPRIL-HYDROCHLOROTHIAZIDE 10-12.5 MG PO TABS: 1.0000 | ORAL_TABLET | Freq: Every day | ORAL | 1 refills | Status: DC

## 2020-06-02 MED ORDER — LANTUS SOLOSTAR 100 UNIT/ML ~~LOC~~ SOPN: 15.0000 [IU] | PEN_INJECTOR | Freq: Every day | SUBCUTANEOUS | 99 refills | Status: DC

## 2020-06-02 MED ORDER — EMPAGLIFLOZIN 25 MG PO TABS: 25.0000 mg | ORAL_TABLET | Freq: Every day | ORAL | 3 refills | Status: DC

## 2020-06-02 MED ORDER — METFORMIN HCL 1000 MG PO TABS: ORAL_TABLET | ORAL | 1 refills | Status: DC

## 2020-06-02 MED ORDER — ROSUVASTATIN CALCIUM 5 MG PO TABS: 5.0000 mg | ORAL_TABLET | Freq: Every day | ORAL | 1 refills | Status: DC

## 2020-06-02 NOTE — Assessment & Plan Note (Signed)
Encouraged heart healthy diet, increase exercise, avoid trans fats, consider a krill oil cap daily 

## 2020-06-02 NOTE — Progress Notes (Signed)
Patient ID: Kathleen Acevedo, female    DOB: 17-Mar-1961  Age: 59 y.o. MRN: [id number]    Subjective:  Subjective  HPI Kathleen Acevedo presents for f/u dm.   bs running 90-234   She is starting healthy weight and wellness next month-     HYPERTENSION   Blood pressure range-not checking   Chest pain- no      Dyspnea- no Lightheadedness- no   Edema- no  Other side effects - no   Medication compliance: good Low salt diet- yes    DIABETES    Blood Sugar ranges-90-234  Polyuria- no New Visual problems- no  Hypoglycemic symptoms- no  Other side effects-no Medication compliance - good Last eye exam- due Foot exam- last visit    HYPERLIPIDEMIA  Medication compliance- no RUQ pain- no  Muscle aches- no Other side effects-no      Review of Systems  Constitutional: Negative for appetite change, diaphoresis, fatigue and unexpected weight change.  Eyes: Negative for pain, redness and visual disturbance.  Respiratory: Negative for cough, chest tightness, shortness of breath and wheezing.   Cardiovascular: Negative for chest pain, palpitations and leg swelling.  Endocrine: Negative for cold intolerance, heat intolerance, polydipsia, polyphagia and polyuria.  Genitourinary: Negative for difficulty urinating, dysuria and frequency.  Neurological: Negative for dizziness, light-headedness, numbness and headaches.    History Past Medical History:  Diagnosis Date  . Diabetes mellitus   . Hyperlipidemia   . Hypertension     She has a past surgical history that includes Abdominal hysterectomy; Oophorectomy; and Breast biopsy (Left).   Her family history includes Diabetes in her father; High blood pressure in her father; Obesity in her father.She reports that she has never smoked. She has never used smokeless tobacco. She reports that she does not drink alcohol and does not use drugs.  Current Outpatient Medications on File Prior to Visit  Medication Sig Dispense Refill  . ferrous  sulfate 325 (65 FE) MG tablet Take 1 tablet (325 mg total) by mouth 2 (two) times daily.     No current facility-administered medications on file prior to visit.     Objective:  Objective  Physical Exam Vitals and nursing note reviewed.  Constitutional:      Appearance: She is well-developed.  HENT:     Head: Normocephalic and atraumatic.  Eyes:     Conjunctiva/sclera: Conjunctivae normal.  Neck:     Thyroid: No thyromegaly.     Vascular: No carotid bruit or JVD.  Cardiovascular:     Rate and Rhythm: Normal rate and regular rhythm.     Heart sounds: Normal heart sounds. No murmur heard.   Pulmonary:     Effort: Pulmonary effort is normal. No respiratory distress.     Breath sounds: Normal breath sounds. No wheezing or rales.  Chest:     Chest wall: No tenderness.  Musculoskeletal:     Cervical back: Normal range of motion and neck supple.  Neurological:     Mental Status: She is alert and oriented to person, place, and time.    Diabetic Foot Exam - Simple   Simple Foot Form Diabetic Foot exam was performed with the following findings: Yes 06/02/2020  8:47 AM  Visual Inspection No deformities, no ulcerations, no other skin breakdown bilaterally: Yes Sensation Testing Intact to touch and monofilament testing bilaterally: Yes Pulse Check Posterior Tibialis and Dorsalis pulse intact bilaterally: Yes Comments     BP 130/80 (BP Location: Left Arm, Patient Position: Sitting, Cuff  Size: Large)   Pulse 63   Temp 98.3 F (36.8 C) (Oral)   Resp 18   Ht 5\' 8"  (1.727 m)   Wt 222 lb 12.8 oz (101.1 kg)   SpO2 99%   BMI 33.88 kg/m  Wt Readings from Last 3 Encounters:  06/02/20 222 lb 12.8 oz (101.1 kg)  02/14/20 213 lb 6 oz (96.8 kg)  05/30/19 205 lb 9.6 oz (93.3 kg)     Lab Results  Component Value Date   WBC 3.8 (L) 02/14/2020   HGB 12.1 02/14/2020   HCT 36.2 02/14/2020   PLT 244.0 02/14/2020   GLUCOSE 72 02/14/2020   CHOL 168 02/14/2020   TRIG 119.0 02/14/2020     HDL 49.90 02/14/2020   LDLDIRECT 103.6 08/23/2012   LDLCALC 94 02/14/2020   ALT 14 02/14/2020   AST 14 02/14/2020   NA 141 02/14/2020   K 4.2 02/14/2020   CL 103 02/14/2020   CREATININE 0.96 02/14/2020   BUN 22 02/14/2020   CO2 30 02/14/2020   TSH 1.78 02/14/2020   HGBA1C 8.2 (H) 02/14/2020   MICROALBUR <0.7 02/14/2020    MM 3D SCREEN BREAST BILATERAL  Result Date: 08/07/2018 CLINICAL DATA:  Screening. EXAM: DIGITAL SCREENING BILATERAL MAMMOGRAM WITH TOMO AND CAD COMPARISON:  Previous exam(s). ACR Breast Density Category b: There are scattered areas of fibroglandular density. FINDINGS: There are no findings suspicious for malignancy. Images were processed with CAD. IMPRESSION: No mammographic evidence of malignancy. A result letter of this screening mammogram will be mailed directly to the patient. RECOMMENDATION: Screening mammogram in one year. (Code:SM-B-01Y) BI-RADS CATEGORY  1: Negative. Electronically Signed   By: Claudie Revering M.D.   On: 08/07/2018 12:54     Assessment & Plan:  Plan  I have discontinued Kathleen Acevedo's OneTouch Delica Lancets 18E and OneTouch Ultra. I have changed her Jardiance to empagliflozin. I have also changed her Vitamin D (Ergocalciferol). Additionally, I am having her maintain her ferrous sulfate, lisinopril-hydrochlorothiazide, metFORMIN, rosuvastatin, and Lantus SoloStar.  Meds ordered this encounter  Medications  . lisinopril-hydrochlorothiazide (ZESTORETIC) 10-12.5 MG tablet    Sig: Take 1 tablet by mouth daily.    Dispense:  90 tablet    Refill:  1  . metFORMIN (GLUCOPHAGE) 1000 MG tablet    Sig: TAKE 1 TABLET BY MOUTH TWICE DAILY .    Dispense:  180 tablet    Refill:  1  . rosuvastatin (CRESTOR) 5 MG tablet    Sig: Take 1 tablet (5 mg total) by mouth daily.    Dispense:  90 tablet    Refill:  1  . Vitamin D, Ergocalciferol, (DRISDOL) 1.25 MG (50000 UNIT) CAPS capsule    Sig: Take  One every other week    Dispense:  12 capsule     Refill:  2  . insulin glargine (LANTUS SOLOSTAR) 100 UNIT/ML Solostar Pen    Sig: Inject 15 Units into the skin daily.    Dispense:  5 pen    Refill:  PRN  . empagliflozin (JARDIANCE) 25 MG TABS tablet    Sig: Take 1 tablet (25 mg total) by mouth daily.    Dispense:  90 tablet    Refill:  3    Problem List Items Addressed This Visit      Unprioritized   DM (diabetes mellitus) type II uncontrolled, periph vascular disorder (HCC)   Relevant Medications   lisinopril-hydrochlorothiazide (ZESTORETIC) 10-12.5 MG tablet   metFORMIN (GLUCOPHAGE) 1000 MG tablet   rosuvastatin (  CRESTOR) 5 MG tablet   insulin glargine (LANTUS SOLOSTAR) 100 UNIT/ML Solostar Pen   empagliflozin (JARDIANCE) 25 MG TABS tablet   Essential hypertension    Well controlled, no changes to meds. Encouraged heart healthy diet such as the DASH diet and exercise as tolerated.       Relevant Medications   lisinopril-hydrochlorothiazide (ZESTORETIC) 10-12.5 MG tablet   rosuvastatin (CRESTOR) 5 MG tablet   Other Relevant Orders   Hemoglobin A1c   Lipid panel   Comprehensive metabolic panel   Hyperlipidemia associated with type 2 diabetes mellitus ([place]) - Primary    Encouraged heart healthy diet, increase exercise, avoid trans fats, consider a krill oil cap daily      Relevant Medications   lisinopril-hydrochlorothiazide (ZESTORETIC) 10-12.5 MG tablet   metFORMIN (GLUCOPHAGE) 1000 MG tablet   rosuvastatin (CRESTOR) 5 MG tablet   insulin glargine (LANTUS SOLOSTAR) 100 UNIT/ML Solostar Pen   empagliflozin (JARDIANCE) 25 MG TABS tablet   Other Relevant Orders   Hemoglobin A1c   Lipid panel   Comprehensive metabolic panel    Other Visit Diagnoses    Vitamin D deficiency       Relevant Medications   Vitamin D, Ergocalciferol, (DRISDOL) 1.25 MG (50000 UNIT) CAPS capsule   Type 2 diabetes mellitus without complication, without long-term current use of insulin (HCC)       Relevant Medications    lisinopril-hydrochlorothiazide (ZESTORETIC) 10-12.5 MG tablet   metFORMIN (GLUCOPHAGE) 1000 MG tablet   rosuvastatin (CRESTOR) 5 MG tablet   insulin glargine (LANTUS SOLOSTAR) 100 UNIT/ML Solostar Pen   empagliflozin (JARDIANCE) 25 MG TABS tablet   Other Relevant Orders   Hemoglobin A1c   Lipid panel   Comprehensive metabolic panel      Follow-up: Return in about 3 months (around 09/02/2020), or if symptoms worsen or fail to improve, for hypertension, hyperlipidemia, diabetes II.  Ann Held, DO

## 2020-06-02 NOTE — Patient Instructions (Signed)
Carbohydrate Counting for Diabetes Mellitus, Adult  Carbohydrate counting is a method of keeping track of how many carbohydrates you eat. Eating carbohydrates naturally increases the amount of sugar (glucose) in the blood. Counting how many carbohydrates you eat helps keep your blood glucose within normal limits, which helps you manage your diabetes (diabetes mellitus). It is important to know how many carbohydrates you can safely have in each meal. This is different for every person. A diet and nutrition specialist (registered dietitian) can help you make a meal plan and calculate how many carbohydrates you should have at each meal and snack. Carbohydrates are found in the following foods:  Grains, such as breads and cereals.  Dried beans and soy products.  Starchy vegetables, such as potatoes, peas, and corn.  Fruit and fruit juices.  Milk and yogurt.  Sweets and snack foods, such as cake, cookies, candy, chips, and soft drinks. How do I count carbohydrates? There are two ways to count carbohydrates in food. You can use either of the methods or a combination of both. Reading "Nutrition Facts" on packaged food The "Nutrition Facts" list is included on the labels of almost all packaged foods and beverages in the [place] It includes:  The serving size.  Information about nutrients in each serving, including the grams (g) of carbohydrate per serving. To use the "Nutrition Facts":  Decide how many servings you will have.  Multiply the number of servings by the number of carbohydrates per serving.  The resulting number is the total amount of carbohydrates that you will be having. Learning standard serving sizes of other foods When you eat carbohydrate foods that are not packaged or do not include "Nutrition Facts" on the label, you need to measure the servings in order to count the amount of carbohydrates:  Measure the foods that you will eat with a food scale or measuring cup, if  needed.  Decide how many standard-size servings you will eat.  Multiply the number of servings by 15. Most carbohydrate-rich foods have about 15 g of carbohydrates per serving. ? For example, if you eat 8 oz (170 g) of strawberries, you will have eaten 2 servings and 30 g of carbohydrates (2 servings x 15 g = 30 g).  For foods that have more than one food mixed, such as soups and casseroles, you must count the carbohydrates in each food that is included. The following list contains standard serving sizes of common carbohydrate-rich foods. Each of these servings has about 15 g of carbohydrates:   hamburger bun or  English muffin.   oz (15 mL) syrup.   oz (14 g) jelly.  1 slice of bread.  1 six-inch tortilla.  3 oz (85 g) cooked rice or pasta.  4 oz (113 g) cooked dried beans.  4 oz (113 g) starchy vegetable, such as peas, corn, or potatoes.  4 oz (113 g) hot cereal.  4 oz (113 g) mashed potatoes or  of a large baked potato.  4 oz (113 g) canned or frozen fruit.  4 oz (120 mL) fruit juice.  4-6 crackers.  6 chicken nuggets.  6 oz (170 g) unsweetened dry cereal.  6 oz (170 g) plain fat-free yogurt or yogurt sweetened with artificial sweeteners.  8 oz (240 mL) milk.  8 oz (170 g) fresh fruit or one small piece of fruit.  24 oz (680 g) popped popcorn. Example of carbohydrate counting Sample meal  3 oz (85 g) chicken breast.  6 oz (170 g)   brown rice.  4 oz (113 g) corn.  8 oz (240 mL) milk.  8 oz (170 g) strawberries with sugar-free whipped topping. Carbohydrate calculation 1. Identify the foods that contain carbohydrates: ? Rice. ? Corn. ? Milk. ? Strawberries. 2. Calculate how many servings you have of each food: ? 2 servings rice. ? 1 serving corn. ? 1 serving milk. ? 1 serving strawberries. 3. Multiply each number of servings by 15 g: ? 2 servings rice x 15 g = 30 g. ? 1 serving corn x 15 g = 15 g. ? 1 serving milk x 15 g = 15 g. ? 1  serving strawberries x 15 g = 15 g. 4. Add together all of the amounts to find the total grams of carbohydrates eaten: ? 30 g + 15 g + 15 g + 15 g = 75 g of carbohydrates total. Summary  Carbohydrate counting is a method of keeping track of how many carbohydrates you eat.  Eating carbohydrates naturally increases the amount of sugar (glucose) in the blood.  Counting how many carbohydrates you eat helps keep your blood glucose within normal limits, which helps you manage your diabetes.  A diet and nutrition specialist (registered dietitian) can help you make a meal plan and calculate how many carbohydrates you should have at each meal and snack. This information is not intended to replace advice given to you by your health care provider. Make sure you discuss any questions you have with your health care provider. Document Revised: 05/11/2017 Document Reviewed: 03/30/2016 Elsevier Patient Education  2020 Elsevier Inc.  

## 2020-06-02 NOTE — Assessment & Plan Note (Signed)
Well controlled, no changes to meds. Encouraged heart healthy diet such as the DASH diet and exercise as tolerated.  °

## 2020-06-04 ENCOUNTER — Other Ambulatory Visit: Payer: Self-pay | Admitting: Family Medicine

## 2020-06-04 DIAGNOSIS — E1169 Type 2 diabetes mellitus with other specified complication: Secondary | ICD-10-CM

## 2020-06-04 DIAGNOSIS — E785 Hyperlipidemia, unspecified: Secondary | ICD-10-CM

## 2020-06-04 DIAGNOSIS — E1165 Type 2 diabetes mellitus with hyperglycemia: Secondary | ICD-10-CM

## 2020-08-07 ENCOUNTER — Other Ambulatory Visit: Payer: Self-pay | Admitting: Family Medicine

## 2020-08-07 DIAGNOSIS — Z1231 Encounter for screening mammogram for malignant neoplasm of breast: Secondary | ICD-10-CM

## 2020-09-01 ENCOUNTER — Ambulatory Visit
Admission: RE | Admit: 2020-09-01 | Discharge: 2020-09-01 | Disposition: A | Discharge status: Home / Self Care | Payer: PRIVATE HEALTH INSURANCE | Source: Ambulatory Visit | Attending: Family Medicine | Admitting: Family Medicine

## 2020-09-01 ENCOUNTER — Other Ambulatory Visit: Payer: Self-pay

## 2020-09-01 DIAGNOSIS — Z1231 Encounter for screening mammogram for malignant neoplasm of breast: Secondary | ICD-10-CM

## 2020-09-03 ENCOUNTER — Other Ambulatory Visit: Payer: Self-pay | Admitting: Family Medicine

## 2020-09-03 DIAGNOSIS — R928 Other abnormal and inconclusive findings on diagnostic imaging of breast: Secondary | ICD-10-CM

## 2020-09-04 ENCOUNTER — Other Ambulatory Visit: Payer: Self-pay

## 2020-09-04 ENCOUNTER — Ambulatory Visit
Admission: RE | Admit: 2020-09-04 | Discharge: 2020-09-04 | Disposition: A | Discharge status: Home / Self Care | Payer: PRIVATE HEALTH INSURANCE | Source: Ambulatory Visit | Attending: Family Medicine | Admitting: Family Medicine

## 2020-09-04 ENCOUNTER — Other Ambulatory Visit: Payer: Self-pay | Admitting: Family Medicine

## 2020-09-04 DIAGNOSIS — R921 Mammographic calcification found on diagnostic imaging of breast: Secondary | ICD-10-CM

## 2020-09-04 DIAGNOSIS — R928 Other abnormal and inconclusive findings on diagnostic imaging of breast: Secondary | ICD-10-CM

## 2020-09-07 ENCOUNTER — Ambulatory Visit: Payer: PRIVATE HEALTH INSURANCE | Admitting: Family Medicine

## 2020-09-11 ENCOUNTER — Ambulatory Visit
Admission: RE | Admit: 2020-09-11 | Discharge: 2020-09-11 | Disposition: A | Discharge status: Home / Self Care | Payer: PRIVATE HEALTH INSURANCE | Source: Ambulatory Visit | Attending: Family Medicine | Admitting: Family Medicine

## 2020-09-11 ENCOUNTER — Other Ambulatory Visit: Payer: Self-pay

## 2020-09-11 DIAGNOSIS — R921 Mammographic calcification found on diagnostic imaging of breast: Secondary | ICD-10-CM

## 2020-09-11 HISTORY — PX: BREAST BIOPSY: SHX20

## 2020-09-18 ENCOUNTER — Other Ambulatory Visit: Payer: Self-pay

## 2020-09-18 ENCOUNTER — Other Ambulatory Visit (INDEPENDENT_AMBULATORY_CARE_PROVIDER_SITE_OTHER): Payer: PRIVATE HEALTH INSURANCE

## 2020-09-18 ENCOUNTER — Ambulatory Visit: Payer: PRIVATE HEALTH INSURANCE | Admitting: Family Medicine

## 2020-09-18 DIAGNOSIS — E1165 Type 2 diabetes mellitus with hyperglycemia: Secondary | ICD-10-CM | POA: Diagnosis not present

## 2020-09-18 DIAGNOSIS — E1169 Type 2 diabetes mellitus with other specified complication: Secondary | ICD-10-CM | POA: Diagnosis not present

## 2020-09-18 DIAGNOSIS — E785 Hyperlipidemia, unspecified: Secondary | ICD-10-CM | POA: Diagnosis not present

## 2020-09-18 LAB — LIPID PANEL
Cholesterol: 127 mg/dL (ref 0–200)
HDL: 44 mg/dL (ref >39.00)
LDL Cholesterol: 61 mg/dL (ref 0–99)
NonHDL: 83.16
Total CHOL/HDL Ratio: 3
Triglycerides: 109 mg/dL (ref 0.0–149.0)
VLDL: 21.8 mg/dL (ref 0.0–40.0)

## 2020-09-18 LAB — COMPREHENSIVE METABOLIC PANEL
ALT: 15 U/L (ref 0–35)
AST: 15 U/L (ref 0–37)
Albumin: 4 g/dL (ref 3.5–5.2)
Alkaline Phosphatase: 48 U/L (ref 39–117)
BUN: 29 mg/dL — ABNORMAL HIGH (ref 6–23)
CO2: 28 mEq/L (ref 19–32)
Calcium: 9.6 mg/dL (ref 8.4–10.5)
Chloride: 102 mEq/L (ref 96–112)
Creatinine, Ser: 1.26 mg/dL — ABNORMAL HIGH (ref 0.40–1.20)
GFR: 46.19 mL/min — ABNORMAL LOW (ref >60.00)
Glucose, Bld: 153 mg/dL — ABNORMAL HIGH (ref 70–99)
Potassium: 4.5 mEq/L (ref 3.5–5.1)
Sodium: 138 mEq/L (ref 135–145)
Total Bilirubin: 0.5 mg/dL (ref 0.2–1.2)
Total Protein: 6.6 g/dL (ref 6.0–8.3)

## 2020-09-18 LAB — HEMOGLOBIN A1C: Hgb A1c MFr Bld: 7.3 % — ABNORMAL HIGH (ref 4.6–6.5)

## 2020-09-23 ENCOUNTER — Other Ambulatory Visit: Payer: Self-pay | Admitting: Family Medicine

## 2020-09-23 DIAGNOSIS — E1165 Type 2 diabetes mellitus with hyperglycemia: Secondary | ICD-10-CM

## 2020-09-23 DIAGNOSIS — E1169 Type 2 diabetes mellitus with other specified complication: Secondary | ICD-10-CM

## 2020-09-23 DIAGNOSIS — E785 Hyperlipidemia, unspecified: Secondary | ICD-10-CM

## 2020-11-18 ENCOUNTER — Encounter: Payer: Self-pay | Admitting: Family Medicine

## 2020-11-18 NOTE — Telephone Encounter (Signed)
Give her number for cone testing  Can use flonase , and otc antihistamine (claritin, zyrtec etc) Can do virtual if needed

## 2020-12-07 ENCOUNTER — Telehealth: Payer: Self-pay | Admitting: Family Medicine

## 2020-12-07 DIAGNOSIS — I10 Essential (primary) hypertension: Secondary | ICD-10-CM

## 2020-12-07 MED ORDER — LISINOPRIL-HYDROCHLOROTHIAZIDE 10-12.5 MG PO TABS: 1.0000 | ORAL_TABLET | Freq: Every day | ORAL | 0 refills | Status: DC

## 2020-12-07 NOTE — Telephone Encounter (Signed)
Refill sent.

## 2020-12-07 NOTE — Telephone Encounter (Signed)
Medication: lisinopril-hydrochlorothiazide (ZESTORETIC) 10-12.5 MG tablet [[id number]]    Has the patient contacted their pharmacy? No. (If no, request that the patient contact the pharmacy for the refill.) (If yes, when and what did the pharmacy advise?)  Preferred Pharmacy (with phone number or street name):  [facility], [place] [address]  Phone:  [phone] Fax:  [phone]  Agent: Please be advised that RX refills may take up to 3 business days. We ask that you follow-up with your pharmacy.

## 2020-12-08 ENCOUNTER — Other Ambulatory Visit: Payer: Self-pay | Admitting: Family Medicine

## 2020-12-08 ENCOUNTER — Encounter: Payer: Self-pay | Admitting: Family Medicine

## 2020-12-08 DIAGNOSIS — E1165 Type 2 diabetes mellitus with hyperglycemia: Secondary | ICD-10-CM

## 2020-12-08 NOTE — Telephone Encounter (Signed)
Referral placed.

## 2020-12-08 NOTE — Telephone Encounter (Signed)
Please advise 

## 2020-12-15 ENCOUNTER — Telehealth: Payer: Self-pay | Admitting: Family Medicine

## 2020-12-15 DIAGNOSIS — I10 Essential (primary) hypertension: Secondary | ICD-10-CM

## 2020-12-15 MED ORDER — LISINOPRIL-HYDROCHLOROTHIAZIDE 10-12.5 MG PO TABS: 1.0000 | ORAL_TABLET | Freq: Every day | ORAL | 0 refills | Status: DC

## 2020-12-15 NOTE — Telephone Encounter (Signed)
Rx resent.

## 2020-12-15 NOTE — Telephone Encounter (Signed)
Patient states [facility] has not received her script for lisinopril-hydrochlorothiazide (ZESTORETIC) 10-12.5 MG tablet   Please resend to pharmacy :  [facility] [address]  [place], [place] [address]  Phone:  [phone] Fax:  [phone]  DEA #:  --  DAW Reason: --

## 2021-01-14 ENCOUNTER — Other Ambulatory Visit: Payer: Self-pay | Admitting: Family Medicine

## 2021-01-14 DIAGNOSIS — I10 Essential (primary) hypertension: Secondary | ICD-10-CM

## 2021-01-26 ENCOUNTER — Other Ambulatory Visit: Payer: Self-pay

## 2021-01-26 ENCOUNTER — Ambulatory Visit: Payer: PRIVATE HEALTH INSURANCE | Admitting: Internal Medicine

## 2021-01-26 ENCOUNTER — Other Ambulatory Visit: Payer: Self-pay | Admitting: Family Medicine

## 2021-01-26 ENCOUNTER — Encounter: Payer: Self-pay | Admitting: Internal Medicine

## 2021-01-26 VITALS — BP 148/92 | HR 64 | Ht 68.0 in | Wt 218.4 lb

## 2021-01-26 DIAGNOSIS — I10 Essential (primary) hypertension: Secondary | ICD-10-CM

## 2021-01-26 DIAGNOSIS — E7849 Other hyperlipidemia: Secondary | ICD-10-CM | POA: Diagnosis not present

## 2021-01-26 DIAGNOSIS — E1165 Type 2 diabetes mellitus with hyperglycemia: Secondary | ICD-10-CM

## 2021-01-26 LAB — POCT GLYCOSYLATED HEMOGLOBIN (HGB A1C): Hemoglobin A1C: 7.4 % — AB (ref 4.0–5.6)

## 2021-01-26 LAB — POCT GLUCOSE (DEVICE FOR HOME USE): POC Glucose: 80 mg/dl (ref 70–99)

## 2021-01-26 MED ORDER — TRULICITY 0.75 MG/0.5ML ~~LOC~~ SOAJ: 0.7500 mg | SUBCUTANEOUS | 3 refills | Status: DC

## 2021-01-26 NOTE — Progress Notes (Signed)
Name: Kathleen Acevedo  MRN/ DOB: [id number], Apr 16, 1961   Age/ Sex: 60 y.o., female    PCP: Carollee Herter, Alferd Apa, DO   Reason for Endocrinology Evaluation: Type 2 Diabetes Mellitus     Date of Initial Endocrinology Visit: 01/26/2021     PATIENT IDENTIFIER: Kathleen Acevedo is a 60 y.o. female with a past medical history of T2DM, HTN . The patient presented for initial endocrinology clinic visit on 01/26/2021 for consultative assistance with her diabetes management.    HPI: Ms. Stallings was    Diagnosed with DM in 2008 Prior Medications tried/Intolerance: Victoza - no intolerance  Currently checking blood sugars 2 x / day Hypoglycemia episodes : no          Hemoglobin A1c has ranged from 6.5% in 2019, peaking at 12.4% in 2012. Patient required assistance for hypoglycemia: no Patient has required hospitalization within the last 1 year from hyper or hypoglycemia: no  In terms of diet, the patient eats 2 meals a day, and 2 snacks . Occasional sweet tea   HOME DIABETES REGIMEN: Jardiance 25 mg daily  Lantus 20 units daily  Metformin 1000 mg BID     Statin: yes ACE-I/ARB: yes Prior Diabetic Education: yes   METER DOWNLOAD SUMMARY: Did not bring     DIABETIC COMPLICATIONS: Microvascular complications:    Denies: CKD, retinopathy, neuropathy   Last eye exam: Completed 2022  Macrovascular complications:    Denies: CAD, PVD, CVA   PAST HISTORY: Past Medical History:  Past Medical History:  Diagnosis Date  . Diabetes mellitus   . Hyperlipidemia   . Hypertension    Past Surgical History:  Past Surgical History:  Procedure Laterality Date  . ABDOMINAL HYSTERECTOMY    . BREAST BIOPSY Left 09/19/2013    Fibrocystic changes and fibroadenomatoid changes   . OOPHORECTOMY        Social History:  reports that she has never smoked. She has never used smokeless tobacco. She reports that she does not drink alcohol and does not use drugs. Family History:  Family  History  Problem Relation Age of Onset  . Diabetes Father   . High blood pressure Father   . Obesity Father   . Colon cancer Neg Hx   . Colon polyps Neg Hx      HOME MEDICATIONS: Allergies as of 01/26/2021   No Known Allergies     Medication List       Accurate as of January 26, 2021  8:41 AM. If you have any questions, ask your nurse or doctor.        empagliflozin 25 MG Tabs tablet Commonly known as: Jardiance Take 1 tablet (25 mg total) by mouth daily.   ferrous sulfate 325 (65 FE) MG tablet Take 1 tablet (325 mg total) by mouth 2 (two) times daily.   Lantus SoloStar 100 UNIT/ML Solostar Pen Generic drug: insulin glargine Inject 15 Units into the skin daily.   lisinopril-hydrochlorothiazide 10-12.5 MG tablet Commonly known as: ZESTORETIC Take 1 tablet by mouth daily. Pt needs OV for further refills   metFORMIN 1000 MG tablet Commonly known as: GLUCOPHAGE TAKE 1 TABLET BY MOUTH TWICE DAILY .   rosuvastatin 5 MG tablet Commonly known as: Crestor Take 1 tablet (5 mg total) by mouth daily.   Vitamin D (Ergocalciferol) 1.25 MG (50000 UNIT) Caps capsule Commonly known as: DRISDOL Take  One every other week        ALLERGIES: No Known Allergies   REVIEW  OF SYSTEMS: A comprehensive ROS was conducted with the patient and is negative except as per HPI and below:  Review of Systems  Gastrointestinal: Negative for abdominal pain, diarrhea and vomiting.  Neurological: Negative for tingling.      OBJECTIVE:   VITAL SIGNS: BP (!) 148/92   Pulse 64   Ht 5\' 8"  (1.727 m)   Wt 218 lb 6 oz (99.1 kg)   SpO2 98%   BMI 33.20 kg/m    PHYSICAL EXAM:  General: Pt appears well and is in NAD  Neck: General: Supple without adenopathy or carotid bruits. Thyroid: Thyroid size normal.  No goiter or nodules appreciated  Lungs: Clear with good BS bilat with no rales, rhonchi, or wheezes  Heart: RRR with normal S1 and S2 and no gallops; no murmurs; no rub  Abdomen:  Normoactive bowel sounds, soft, nontender, without masses or organomegaly palpable  Extremities:  Lower extremities - No pretibial edema. No lesions.  Skin: Normal texture and temperature to palpation. No rash noted.   Neuro: MS is good with appropriate affect, pt is alert and Ox3    DM foot exam: 01/26/2021  The skin of the feet is intact without sores or ulcerations. The pedal pulses are 2+ on right and 2+ on left. The sensation is intact to a screening 5.07, 10 gram monofilament bilaterally   DATA REVIEWED:  Lab Results  Component Value Date   HGBA1C 7.4 (A) 01/26/2021   HGBA1C 7.3 (H) 09/18/2020   HGBA1C 7.8 (H) 06/02/2020   Lab Results  Component Value Date   MICROALBUR <0.7 02/14/2020   LDLCALC 61 09/18/2020   CREATININE 1.26 (H) 09/18/2020   Lab Results  Component Value Date   MICRALBCREAT 1.1 02/14/2020    Lab Results  Component Value Date   CHOL 127 09/18/2020   HDL 44.00 09/18/2020   LDLCALC 61 09/18/2020   LDLDIRECT 103.6 08/23/2012   TRIG 109.0 09/18/2020   CHOLHDL 3 09/18/2020        ASSESSMENT / PLAN / RECOMMENDATIONS:   1) Type 2 Diabetes Mellitus,Sub-Optimally controlled, Without complications - Most recent A1c of 7.4 %. Goal A1c < 7.0 %.    Plan: GENERAL: I have discussed with the patient the pathophysiology of diabetes. We went over the natural progression of the disease. We talked about both insulin resistance and insulin deficiency. We stressed the importance of lifestyle changes including diet and exercise. I explained the complications associated with diabetes including retinopathy, nephropathy, neuropathy as well as increased risk of cardiovascular disease. We went over the benefit seen with glycemic control.    I explained to the patient that diabetic patients are at higher than normal risk for amputations.   We discussed the importance low low carb diet, avoiding sugar sweetened beverages and avoiding   She used to be on Victoza and is  interesting in trying something similar   MEDICATIONS:  - Continue Metformin 1000 mg, 1 tablet with Breakfast and 1 tablet with supper - Continue Jardiance 25 mg, 1 tablet before Breakfast  - Start Trulicity 3.66 mg once weekly  - Decrease lantus to 17 units daily    EDUCATION / INSTRUCTIONS:  BG monitoring instructions: Patient is instructed to check her blood sugars 2 times a day, fasting and bedtime.  Call [facility] [place] [facility] if: BG persistently < 70 . [name] I reviewed the Rule of 15 for the treatment of hypoglycemia in detail with the patient. Literature supplied.   2) Diabetic complications:   Eye: Does  not have known diabetic retinopathy.   Neuro/ Feet: Does not have known diabetic peripheral neuropathy.  Renal: Patient does not have known baseline CKD. She is  on an ACEI/ARB at present.  3) Dyslipidemia :  - LDL at goal. Pt on Crestor     F/U in 3 months        Signed electronically by: Mack Guise, MD  [facility] End[facility]  [facility] [address] [place], [place] [address] Phone: [phone] FAX: [phone]   CC: Ann Held, DO [address] [place] [address] Phone: [phone]  Fax: [phone]    Return to Endocrinology clinic as below: Future Appointments  Date Time Provider [place]  01/26/2021  8:50 AM Josuha Fontanez, Melanie Crazier, MD [facility] [place]

## 2021-01-26 NOTE — Patient Instructions (Signed)
-   Continue Metformin 1000 mg, 1 tablet with Breakfast and 1 tablet with supper - Continue Jardiance 25 mg, 1 tablet before Breakfast  - Start Trulicity 1.98 mg once weekly  - Decrease lantus to 17 units daily    Choose healthy, lower carb lower calorie snacks: toss salad, vegetables, cottage cheese, peanut butter, low fat cheese / string cheese, lower sodium deli meat, tuna salad or chicken salad     HOW TO TREAT LOW BLOOD SUGARS (Blood sugar LESS THAN 70 MG/DL)  Please follow the RULE OF 15 for the treatment of hypoglycemia treatment (when your (blood sugars are less than 70 mg/dL)    STEP 1: Take 15 grams of carbohydrates when your blood sugar is low, which includes:   3-4 GLUCOSE TABS  OR  3-4 OZ OF JUICE OR REGULAR SODA OR  ONE TUBE OF GLUCOSE GEL     STEP 2: RECHECK blood sugar in 15 MINUTES STEP 3: If your blood sugar is still low at the 15 minute recheck --> then, go back to STEP 1 and treat AGAIN with another 15 grams of carbohydrates.

## 2021-01-27 DIAGNOSIS — E7849 Other hyperlipidemia: Secondary | ICD-10-CM | POA: Insufficient documentation

## 2021-01-27 MED ORDER — LISINOPRIL-HYDROCHLOROTHIAZIDE 10-12.5 MG PO TABS: 1.0000 | ORAL_TABLET | Freq: Every day | ORAL | 0 refills | Status: DC

## 2021-02-01 ENCOUNTER — Other Ambulatory Visit: Payer: Self-pay | Admitting: Internal Medicine

## 2021-02-01 ENCOUNTER — Encounter: Payer: Self-pay | Admitting: Internal Medicine

## 2021-02-01 MED ORDER — VICTOZA 18 MG/3ML ~~LOC~~ SOPN: 1.2000 mg | PEN_INJECTOR | Freq: Every day | SUBCUTANEOUS | 1 refills | Status: DC

## 2021-02-04 ENCOUNTER — Other Ambulatory Visit: Payer: Self-pay | Admitting: Family Medicine

## 2021-02-04 DIAGNOSIS — E1151 Type 2 diabetes mellitus with diabetic peripheral angiopathy without gangrene: Secondary | ICD-10-CM

## 2021-02-04 DIAGNOSIS — IMO0002 Reserved for concepts with insufficient information to code with codable children: Secondary | ICD-10-CM

## 2021-02-04 DIAGNOSIS — E1165 Type 2 diabetes mellitus with hyperglycemia: Secondary | ICD-10-CM

## 2021-02-28 ENCOUNTER — Other Ambulatory Visit: Payer: Self-pay | Admitting: Family Medicine

## 2021-02-28 DIAGNOSIS — I10 Essential (primary) hypertension: Secondary | ICD-10-CM

## 2021-04-27 ENCOUNTER — Ambulatory Visit: Payer: PRIVATE HEALTH INSURANCE | Admitting: Internal Medicine

## 2021-05-04 ENCOUNTER — Other Ambulatory Visit: Payer: Self-pay

## 2021-05-04 ENCOUNTER — Ambulatory Visit: Payer: PRIVATE HEALTH INSURANCE | Admitting: Internal Medicine

## 2021-05-04 ENCOUNTER — Encounter: Payer: Self-pay | Admitting: Internal Medicine

## 2021-05-04 VITALS — BP 130/82 | HR 88 | Ht 68.0 in | Wt 216.0 lb

## 2021-05-04 DIAGNOSIS — E1165 Type 2 diabetes mellitus with hyperglycemia: Secondary | ICD-10-CM | POA: Diagnosis not present

## 2021-05-04 LAB — POCT GLYCOSYLATED HEMOGLOBIN (HGB A1C): Hemoglobin A1C: 7.2 % — AB (ref 4.0–5.6)

## 2021-05-04 LAB — POCT GLUCOSE (DEVICE FOR HOME USE): POC Glucose: 143 mg/dl — AB (ref 70–99)

## 2021-05-04 MED ORDER — RYBELSUS 3 MG PO TABS: 3.0000 mg | ORAL_TABLET | Freq: Every day | ORAL | 4 refills | Status: DC

## 2021-05-04 NOTE — Patient Instructions (Signed)
-   Continue Metformin 1000 mg, 1 tablet with Breakfast and 1 tablet with supper - Continue Jardiance 25 mg, 1 tablet before Breakfast  - Continue Trulicity 1.19 mg once weekly , when finished Start Rybelsus 3 mg, 1 tablet daily in the morning  - Continue  lantus  15  units daily      HOW TO TREAT LOW BLOOD SUGARS (Blood sugar LESS THAN 70 MG/DL) Please follow the RULE OF 15 for the treatment of hypoglycemia treatment (when your (blood sugars are less than 70 mg/dL)   STEP 1: Take 15 grams of carbohydrates when your blood sugar is low, which includes:  3-4 GLUCOSE TABS  OR 3-4 OZ OF JUICE OR REGULAR SODA OR ONE TUBE OF GLUCOSE GEL    STEP 2: RECHECK blood sugar in 15 MINUTES STEP 3: If your blood sugar is still low at the 15 minute recheck --> then, go back to STEP 1 and treat AGAIN with another 15 grams of carbohydrates.

## 2021-05-04 NOTE — Progress Notes (Signed)
Name: Kathleen Acevedo  Age/ Sex: 60 y.o., female   MRN/ DOB: [id number], 03-23-1961     PCP: Ann Held, DO   Reason for Endocrinology Evaluation: Type 2 Diabetes Mellitus  Initial Endocrine Consultative Visit: 01/26/2021    PATIENT IDENTIFIER: Kathleen Acevedo is a 60 y.o. female with a past medical history of HTN and T2DM. The patient has followed with Endocrinology clinic since 01/26/2021 for consultative assistance with management of her diabetes.  DIABETIC HISTORY:  Kathleen Acevedo was diagnosed with DM in 2008, has been on Victoza in the past without intolerance issues. Her hemoglobin A1c has ranged from 6.5% in 2019, peaking at 12.4% in 2012.   On her initial visit to our clinic her A1c was 7.4 % We started trulicity and continued Metformin, Lantus and jardiance .  Trulicity Kathleen Acevedo were cost prohibitive    SUBJECTIVE:   During the last visit (01/26/2021): A1c 7.4% We continued Metformin, basal insulin , Jardiance and started Trulicity   Today (01/05/9023): Kathleen Acevedo  is here for a follow up on diabetes management. She checks her blood sugars 2 times daily, preprandial to breakfast and bedtime. The patient has not had hypoglycemic episodes.   Denies nausea or vomiting   HOME DIABETES REGIMEN:  Metformin 1000 mg, 1 tablet with Breakfast and 1 tablet with supper Jardiance 25 mg, 1 tablet before Breakfast Lantus  17 units daily - takes 15 units Trulicity 0.97 mg weekly     Statin: yes ACE-I/ARB: yes Prior Diabetic Education: yes   METER DOWNLOAD SUMMARY: Did not bring    DIABETIC COMPLICATIONS: Microvascular complications:   Denies: CKD, neuropathy, retinopathy Last Eye Exam: Completed 2022  Macrovascular complications:   Denies: CAD, CVA, PVD   HISTORY:  Past Medical History:  Past Medical History:  Diagnosis Date   Diabetes mellitus    Hyperlipidemia    Hypertension    Past Surgical History:  Past Surgical History:  Procedure Laterality Date    ABDOMINAL HYSTERECTOMY     BREAST BIOPSY Left 09/19/2013    Fibrocystic changes and fibroadenomatoid changes    OOPHORECTOMY     Social History:  reports that she has never smoked. She has never used smokeless tobacco. She reports that she does not drink alcohol and does not use drugs. Family History:  Family History  Problem Relation Age of Onset   Diabetes Father    High blood pressure Father    Obesity Father    Colon cancer Neg Hx    Colon polyps Neg Hx      HOME MEDICATIONS: Allergies as of 05/04/2021   No Known Allergies      Medication List        Accurate as of May 04, 2021  8:22 AM. If you have any questions, ask your nurse or doctor.          STOP taking these medications    Trulicity 3.53 GD/9.2EQ Sopn Generic drug: Dulaglutide Stopped by: Dorita Sciara, MD   Victoza 18 MG/3ML Sopn Generic drug: liraglutide Stopped by: Dorita Sciara, MD       TAKE these medications    empagliflozin 25 MG Tabs tablet Commonly known as: Jardiance Take 1 tablet (25 mg total) by mouth daily.   ferrous sulfate 325 (65 FE) MG tablet Take 1 tablet (325 mg total) by mouth 2 (two) times daily.   Lantus SoloStar 100 UNIT/ML Solostar Pen Generic drug: insulin glargine Inject 15 Units into the skin daily.  lisinopril-hydrochlorothiazide 10-12.5 MG tablet Commonly known as: ZESTORETIC Take 1 tablet by mouth once daily   metFORMIN 1000 MG tablet Commonly known as: GLUCOPHAGE Take 1 tablet (1,000 mg total) by mouth 2 (two) times daily with a meal.   rosuvastatin 5 MG tablet Commonly known as: Crestor Take 1 tablet (5 mg total) by mouth daily.   Rybelsus 3 MG Tabs Generic drug: Semaglutide Take 3 mg by mouth daily in the afternoon. Started by: Dorita Sciara, MD   Vitamin D (Ergocalciferol) 1.25 MG (50000 UNIT) Caps capsule Commonly known as: DRISDOL Take  One every other week         OBJECTIVE:   Vital Signs: BP 130/82   Pulse 88    Ht 5\' 8"  (1.727 m)   Wt 216 lb (98 kg)   SpO2 96%   BMI 32.84 kg/m   Wt Readings from Last 3 Encounters:  05/04/21 216 lb (98 kg)  01/26/21 218 lb 6 oz (99.1 kg)  06/02/20 222 lb 12.8 oz (101.1 kg)     Exam: General: Pt appears well and is in NAD  Lungs: Clear with good BS bilat with no rales, rhonchi, or wheezes  Heart: RRR with normal S1 and S2 and no gallops; no murmurs; no rub  Abdomen: Normoactive bowel sounds, soft, nontender, without masses or organomegaly palpable  Extremities: No pretibial edema.   Neuro: MS is good with appropriate affect, pt is alert and Ox3      DM foot exam: 01/26/2021   The skin of the feet is intact without sores or ulcerations. The pedal pulses are 2+ on right and 2+ on left. The sensation is intact to a screening 5.07, 10 gram monofilament bilaterally     DATA REVIEWED:  Lab Results  Component Value Date   HGBA1C 7.2 (A) 05/04/2021   HGBA1C 7.4 (A) 01/26/2021   HGBA1C 7.3 (H) 09/18/2020   Lab Results  Component Value Date   MICROALBUR <0.7 02/14/2020   LDLCALC 61 09/18/2020   CREATININE 1.26 (H) 09/18/2020   Lab Results  Component Value Date   MICRALBCREAT 1.1 02/14/2020     Lab Results  Component Value Date   CHOL 127 09/18/2020   HDL 44.00 09/18/2020   LDLCALC 61 09/18/2020   LDLDIRECT 103.6 08/23/2012   TRIG 109.0 09/18/2020   CHOLHDL 3 09/18/2020         ASSESSMENT / PLAN / RECOMMENDATIONS:   1) Type 2 Diabetes Mellitus, Sub-Optimally controlled, Without complications - Most recent A1c of 7.2 %. Goal A1c < 7.0 %.    - Praised pt on weight loss  - No meter today but admits to hyperglycemia after supper up to 200. She is working on low carb diet  - Victoza and Trulicity have been costly, we discussed trying Rybelsus as we have coupoin for $10 if she qualifies.   MEDICATIONS: Continue Metformin 1000 mg, 1 tablet with Breakfast and 1 tablet with supper - Continue Jardiance 25 mg, 1 tablet before Breakfast  -  Continue Trulicity 5.80 mg once weekly , when finished Start Rybelsus 3 mg, 1 tablet daily in the morning  - Continue  lantus  15  units daily   EDUCATION / INSTRUCTIONS: BG monitoring instructions: Patient is instructed to check her blood sugars 2 times a day, fasting and bedtime . Call [place] [facility] clinic if: BG persistently < 70 or > 300. I reviewed the Rule of 15 for the treatment of hypoglycemia in detail with the patient. Literature supplied.  2) Diabetic complications:  Eye: Does not have known diabetic retinopathy.  Neuro/ Feet: Does not have known diabetic peripheral neuropathy .  Renal: Patient does not have known baseline CKD. She   is not on an ACEI/ARB at present.    F/U in 3 months     Signed electronically by: Mack Guise, MD  [facility]  [facility] [address] [place], [place] [address] Phone: [phone] FAX: [phone]   CC: Claudette Laws [address] [place] [address] Phone: [phone]  Fax: [phone]  Return to Endocrinology clinic as below: No future appointments.

## 2021-06-06 ENCOUNTER — Other Ambulatory Visit: Payer: Self-pay | Admitting: Family Medicine

## 2021-06-06 DIAGNOSIS — I10 Essential (primary) hypertension: Secondary | ICD-10-CM

## 2021-06-14 ENCOUNTER — Other Ambulatory Visit: Payer: Self-pay | Admitting: Family Medicine

## 2021-06-14 ENCOUNTER — Telehealth: Payer: Self-pay | Admitting: Family Medicine

## 2021-06-14 DIAGNOSIS — E119 Type 2 diabetes mellitus without complications: Secondary | ICD-10-CM

## 2021-07-11 ENCOUNTER — Other Ambulatory Visit: Payer: Self-pay | Admitting: Internal Medicine

## 2021-07-11 ENCOUNTER — Other Ambulatory Visit: Payer: Self-pay | Admitting: Family Medicine

## 2021-07-11 DIAGNOSIS — E1151 Type 2 diabetes mellitus with diabetic peripheral angiopathy without gangrene: Secondary | ICD-10-CM

## 2021-07-11 DIAGNOSIS — E119 Type 2 diabetes mellitus without complications: Secondary | ICD-10-CM

## 2021-07-11 DIAGNOSIS — E1165 Type 2 diabetes mellitus with hyperglycemia: Secondary | ICD-10-CM

## 2021-07-11 DIAGNOSIS — IMO0002 Reserved for concepts with insufficient information to code with codable children: Secondary | ICD-10-CM

## 2021-07-13 ENCOUNTER — Other Ambulatory Visit: Payer: Self-pay

## 2021-07-13 ENCOUNTER — Encounter: Payer: Self-pay | Admitting: Family Medicine

## 2021-07-13 DIAGNOSIS — E119 Type 2 diabetes mellitus without complications: Secondary | ICD-10-CM

## 2021-07-13 MED ORDER — LANTUS SOLOSTAR 100 UNIT/ML ~~LOC~~ SOPN: 15.0000 [IU] | PEN_INJECTOR | Freq: Every day | SUBCUTANEOUS | 2 refills | Status: DC

## 2021-07-13 NOTE — Telephone Encounter (Signed)
Patient called about her Lantus prescription. She stated that the generic prescription is costing her more then the usual one she gets. She would like to get her old prescription. Please advice.

## 2021-07-14 ENCOUNTER — Other Ambulatory Visit: Payer: Self-pay | Admitting: Family Medicine

## 2021-07-14 DIAGNOSIS — I10 Essential (primary) hypertension: Secondary | ICD-10-CM

## 2021-07-14 MED ORDER — LISINOPRIL-HYDROCHLOROTHIAZIDE 10-12.5 MG PO TABS: 1.0000 | ORAL_TABLET | Freq: Every day | ORAL | 0 refills | Status: DC

## 2021-07-19 ENCOUNTER — Ambulatory Visit: Payer: PRIVATE HEALTH INSURANCE | Admitting: Family Medicine

## 2021-07-26 ENCOUNTER — Encounter: Payer: Self-pay | Admitting: Family Medicine

## 2021-07-26 ENCOUNTER — Ambulatory Visit: Payer: PRIVATE HEALTH INSURANCE | Admitting: Family Medicine

## 2021-07-26 ENCOUNTER — Other Ambulatory Visit: Payer: Self-pay

## 2021-07-26 VITALS — BP 140/84 | HR 65 | Temp 98.1°F | Resp 18 | Ht 68.0 in | Wt 215.0 lb

## 2021-07-26 DIAGNOSIS — E785 Hyperlipidemia, unspecified: Secondary | ICD-10-CM

## 2021-07-26 DIAGNOSIS — I10 Essential (primary) hypertension: Secondary | ICD-10-CM

## 2021-07-26 DIAGNOSIS — E1165 Type 2 diabetes mellitus with hyperglycemia: Secondary | ICD-10-CM | POA: Diagnosis not present

## 2021-07-26 DIAGNOSIS — E1169 Type 2 diabetes mellitus with other specified complication: Secondary | ICD-10-CM

## 2021-07-26 DIAGNOSIS — Z23 Encounter for immunization: Secondary | ICD-10-CM

## 2021-07-26 LAB — COMPREHENSIVE METABOLIC PANEL
ALT: 12 U/L (ref 0–35)
AST: 12 U/L (ref 0–37)
Albumin: 3.9 g/dL (ref 3.5–5.2)
Alkaline Phosphatase: 52 U/L (ref 39–117)
BUN: 21 mg/dL (ref 6–23)
CO2: 29 mEq/L (ref 19–32)
Calcium: 9.4 mg/dL (ref 8.4–10.5)
Chloride: 105 mEq/L (ref 96–112)
Creatinine, Ser: 1.08 mg/dL (ref 0.40–1.20)
GFR: 55.25 mL/min — ABNORMAL LOW (ref >60.00)
Glucose, Bld: 66 mg/dL — ABNORMAL LOW (ref 70–99)
Potassium: 4.2 mEq/L (ref 3.5–5.1)
Sodium: 142 mEq/L (ref 135–145)
Total Bilirubin: 0.4 mg/dL (ref 0.2–1.2)
Total Protein: 6.3 g/dL (ref 6.0–8.3)

## 2021-07-26 LAB — LIPID PANEL
Cholesterol: 147 mg/dL (ref 0–200)
HDL: 44 mg/dL (ref >39.00)
LDL Cholesterol: 77 mg/dL (ref 0–99)
NonHDL: 102.89
Total CHOL/HDL Ratio: 3
Triglycerides: 128 mg/dL (ref 0.0–149.0)
VLDL: 25.6 mg/dL (ref 0.0–40.0)

## 2021-07-26 NOTE — Progress Notes (Signed)
Established Patient Office Visit  Subjective:  Patient ID: Kathleen Acevedo, female    DOB: Mar 06, 1961  Age: 60 y.o. MRN: [id number]  CC:  Chief Complaint  Patient presents with   Hyperlipidemia   Hypertension   Follow-up    HPI Kathleen Acevedo presents for f/u cholesterol and htn  she would also like her flu shot.    Past Medical History:  Diagnosis Date   Diabetes mellitus    Hyperlipidemia    Hypertension     Past Surgical History:  Procedure Laterality Date   ABDOMINAL HYSTERECTOMY     BREAST BIOPSY Left 09/19/2013    Fibrocystic changes and fibroadenomatoid changes    OOPHORECTOMY      Family History  Problem Relation Age of Onset   Diabetes Father    High blood pressure Father    Obesity Father    Colon cancer Neg Hx    Colon polyps Neg Hx     Social History   Socioeconomic History   Marital status: Married    Spouse name: Endora Teresi   Number of children: 1   Years of education: Not on file   Highest education level: Not on file  Occupational History   Occupation: Therapist, music  Tobacco Use   Smoking status: Never   Smokeless tobacco: Never  Vaping Use   Vaping Use: Never used  Substance and Sexual Activity   Alcohol use: No    Alcohol/week: 0.0 standard drinks   Drug use: No   Sexual activity: Yes    Partners: Male  Other Topics Concern   Not on file  Social History Narrative   Exercise --  4-5 days a week-- walking    Social Determinants of Health   Financial Resource Strain: Not on file  Food Insecurity: Not on file  Transportation Needs: Not on file  Physical Activity: Not on file  Stress: Not on file  Social Connections: Not on file  Intimate Partner Violence: Not on file    Outpatient Medications Prior to Visit  Medication Sig Dispense Refill   ferrous sulfate 325 (65 FE) MG tablet Take 1 tablet (325 mg total) by mouth 2 (two) times daily.     insulin glargine (LANTUS SOLOSTAR) 100 UNIT/ML Solostar Pen Inject 15 Units into  the skin daily. 15 mL 2   JARDIANCE 25 MG TABS tablet Take 1 tablet by mouth once daily 30 tablet 2   lisinopril-hydrochlorothiazide (ZESTORETIC) 10-12.5 MG tablet Take 1 tablet by mouth daily. 90 tablet 0   metFORMIN (GLUCOPHAGE) 1000 MG tablet TAKE 1 TABLET BY MOUTH TWICE DAILY WITH MEALS 180 tablet 0   rosuvastatin (CRESTOR) 5 MG tablet Take 1 tablet (5 mg total) by mouth daily. 90 tablet 1   Semaglutide (RYBELSUS) 3 MG TABS Take 3 mg by mouth daily in the afternoon. 30 tablet 4   Vitamin D, Ergocalciferol, (DRISDOL) 1.25 MG (50000 UNIT) CAPS capsule Take  One every other week 12 capsule 2   No facility-administered medications prior to visit.    No Known Allergies  ROS Review of Systems  Constitutional:  Negative for activity change, appetite change, diaphoresis, fatigue and unexpected weight change.  Eyes:  Negative for pain, redness and visual disturbance.  Respiratory:  Negative for cough, chest tightness, shortness of breath and wheezing.   Cardiovascular:  Negative for chest pain, palpitations and leg swelling.  Endocrine: Negative for cold intolerance, heat intolerance, polydipsia, polyphagia and polyuria.  Genitourinary:  Negative for difficulty urinating, dysuria  and frequency.  Neurological:  Negative for dizziness, light-headedness, numbness and headaches.  Psychiatric/Behavioral:  Negative for behavioral problems and dysphoric mood. The patient is not nervous/anxious.      Objective:    Physical Exam Vitals and nursing note reviewed.  Constitutional:      Appearance: She is well-developed.  HENT:     Head: Normocephalic and atraumatic.  Eyes:     Conjunctiva/sclera: Conjunctivae normal.  Neck:     Thyroid: No thyromegaly.     Vascular: No carotid bruit or JVD.  Cardiovascular:     Rate and Rhythm: Normal rate and regular rhythm.     Heart sounds: Normal heart sounds. No murmur heard. Pulmonary:     Effort: Pulmonary effort is normal. No respiratory distress.      Breath sounds: Normal breath sounds. No wheezing or rales.  Chest:     Chest wall: No tenderness.  Musculoskeletal:     Cervical back: Normal range of motion and neck supple.  Neurological:     Mental Status: She is alert and oriented to person, place, and time.    BP 140/84 (BP Location: Left Arm, Patient Position: Sitting, Cuff Size: Normal)   Pulse 65   Temp 98.1 F (36.7 C) (Oral)   Resp 18   Ht 5\' 8"  (1.727 m)   Wt 215 lb (97.5 kg)   SpO2 96%   BMI 32.69 kg/m  Wt Readings from Last 3 Encounters:  07/26/21 215 lb (97.5 kg)  05/04/21 216 lb (98 kg)  01/26/21 218 lb 6 oz (99.1 kg)     Health Maintenance Due  Topic Date Due   HIV Screening  Never done   Zoster Vaccines- Shingrix (1 of 2) Never done   OPHTHALMOLOGY EXAM  04/28/2018   TETANUS/TDAP  12/01/2018   COVID-19 Vaccine (3 - Booster for Pfizer series) 07/20/2020   FOOT EXAM  06/02/2021    There are no preventive care reminders to display for this patient.  Lab Results  Component Value Date   TSH 1.78 02/14/2020   Lab Results  Component Value Date   WBC 3.8 (L) 02/14/2020   HGB 12.1 02/14/2020   HCT 36.2 02/14/2020   MCV 89.0 02/14/2020   PLT 244.0 02/14/2020   Lab Results  Component Value Date   NA 138 09/18/2020   K 4.5 09/18/2020   CO2 28 09/18/2020   GLUCOSE 153 (H) 09/18/2020   BUN 29 (H) 09/18/2020   CREATININE 1.26 (H) 09/18/2020   BILITOT 0.5 09/18/2020   ALKPHOS 48 09/18/2020   AST 15 09/18/2020   ALT 15 09/18/2020   PROT 6.6 09/18/2020   ALBUMIN 4.0 09/18/2020   CALCIUM 9.6 09/18/2020   GFR 46.19 (L) 09/18/2020   Lab Results  Component Value Date   CHOL 127 09/18/2020   Lab Results  Component Value Date   HDL 44.00 09/18/2020   Lab Results  Component Value Date   LDLCALC 61 09/18/2020   Lab Results  Component Value Date   TRIG 109.0 09/18/2020   Lab Results  Component Value Date   CHOLHDL 3 09/18/2020   Lab Results  Component Value Date   HGBA1C 7.2 (A)  05/04/2021      Assessment & Plan:   Problem List Items Addressed This Visit       Unprioritized   Hyperlipidemia associated with type 2 diabetes mellitus ([place])   Relevant Orders   Comprehensive metabolic panel   Lipid panel   Diabetes type 2, uncontrolled ([place])  Per endo       Hyperlipidemia    Encourage heart healthy diet such as MIND or DASH diet, increase exercise, avoid trans fats, simple carbohydrates and processed foods, consider a krill or fish or flaxseed oil cap daily.       Morbid obesity ([place])    Pt would like to go back to Healthy weight and wellness       Relevant Orders   Amb Ref to Medical Weight Management   Primary hypertension - Primary    Well controlled, no changes to meds. Encouraged heart healthy diet such as the DASH diet and exercise as tolerated.       Relevant Orders   Comprehensive metabolic panel   Lipid panel   Other Visit Diagnoses     Need for influenza vaccination       Relevant Orders   Flu Vaccine QUAD 76mo+IM (Fluarix, Fluzone & Alfiuria Quad PF) (Completed)       No orders of the defined types were placed in this encounter.   Follow-up: Return in about 6 months (around 01/23/2022), or if symptoms worsen or fail to improve, for fasting, annual exam.    Ann Held, DO

## 2021-07-26 NOTE — Assessment & Plan Note (Signed)
Per endo °

## 2021-07-26 NOTE — Assessment & Plan Note (Signed)
Well controlled, no changes to meds. Encouraged heart healthy diet such as the DASH diet and exercise as tolerated.  °

## 2021-07-26 NOTE — Patient Instructions (Signed)

## 2021-07-26 NOTE — Assessment & Plan Note (Signed)
Encourage heart healthy diet such as MIND or DASH diet, increase exercise, avoid trans fats, simple carbohydrates and processed foods, consider a krill or fish or flaxseed oil cap daily.  °

## 2021-07-26 NOTE — Assessment & Plan Note (Signed)
Pt would like to go back to Healthy weight and wellness

## 2021-08-10 ENCOUNTER — Telehealth: Payer: Self-pay | Admitting: Family Medicine

## 2021-08-10 ENCOUNTER — Ambulatory Visit: Payer: PRIVATE HEALTH INSURANCE | Admitting: Internal Medicine

## 2021-08-10 DIAGNOSIS — E785 Hyperlipidemia, unspecified: Secondary | ICD-10-CM

## 2021-08-10 DIAGNOSIS — E559 Vitamin D deficiency, unspecified: Secondary | ICD-10-CM

## 2021-08-10 DIAGNOSIS — E1169 Type 2 diabetes mellitus with other specified complication: Secondary | ICD-10-CM

## 2021-08-10 MED ORDER — ROSUVASTATIN CALCIUM 5 MG PO TABS: 5.0000 mg | ORAL_TABLET | Freq: Every day | ORAL | 1 refills | Status: DC

## 2021-08-10 MED ORDER — VITAMIN D (ERGOCALCIFEROL) 1.25 MG (50000 UNIT) PO CAPS: ORAL_CAPSULE | ORAL | 2 refills | Status: DC

## 2021-08-10 NOTE — Telephone Encounter (Signed)
Medication: Vitamin D, Ergocalciferol, (DRISDOL) 1.25 MG (50000 UNIT) CAPS capsule    rosuvastatin (CRESTOR) 5 MG tablet    Has the patient contacted their pharmacy? No. (If no, request that the patient contact the pharmacy for the refill.) (If yes, when and what did the pharmacy advise?)  Preferred Pharmacy (with phone number or street name): [place], [place] [address]  Phone:  [phone]    Agent: Please be advised that RX refills may take up to 3 business days. We ask that you follow-up with your pharmacy.

## 2021-08-10 NOTE — Telephone Encounter (Signed)
Refill sent.

## 2021-08-10 NOTE — Addendum Note (Signed)
Addended by: Sanda Linger on: 08/10/2021 02:21 PM   Modules accepted: Orders

## 2021-08-17 ENCOUNTER — Other Ambulatory Visit: Payer: Self-pay | Admitting: Family Medicine

## 2021-08-17 DIAGNOSIS — Z1231 Encounter for screening mammogram for malignant neoplasm of breast: Secondary | ICD-10-CM

## 2021-08-25 NOTE — Telephone Encounter (Signed)
error 

## 2021-08-31 ENCOUNTER — Other Ambulatory Visit: Payer: Self-pay

## 2021-08-31 ENCOUNTER — Ambulatory Visit: Payer: PRIVATE HEALTH INSURANCE | Admitting: Internal Medicine

## 2021-08-31 ENCOUNTER — Encounter: Payer: Self-pay | Admitting: Internal Medicine

## 2021-08-31 VITALS — BP 134/80 | HR 68 | Ht 68.0 in | Wt 213.0 lb

## 2021-08-31 DIAGNOSIS — E119 Type 2 diabetes mellitus without complications: Secondary | ICD-10-CM | POA: Diagnosis not present

## 2021-08-31 LAB — POCT GLYCOSYLATED HEMOGLOBIN (HGB A1C): Hemoglobin A1C: 7.9 % — AB (ref 4.0–5.6)

## 2021-08-31 LAB — GLUCOSE, POCT (MANUAL RESULT ENTRY): POC Glucose: 124 mg/dl — AB (ref 70–99)

## 2021-08-31 MED ORDER — RYBELSUS 7 MG PO TABS: 7.0000 mg | ORAL_TABLET | Freq: Every day | ORAL | 2 refills | Status: DC

## 2021-08-31 NOTE — Patient Instructions (Addendum)
-   Continue Metformin 1000 mg, 1 tablet with Breakfast and 1 tablet with supper - Continue Jardiance 25 mg, 1 tablet before Breakfast  - Continue  lantus  15  units daily  - Increase Rybelsus 7 mg , 1 tablet before Breakfast      HOW TO TREAT LOW BLOOD SUGARS (Blood sugar LESS THAN 70 MG/DL) Please follow the RULE OF 15 for the treatment of hypoglycemia treatment (when your (blood sugars are less than 70 mg/dL)   STEP 1: Take 15 grams of carbohydrates when your blood sugar is low, which includes:  3-4 GLUCOSE TABS  OR 3-4 OZ OF JUICE OR REGULAR SODA OR ONE TUBE OF GLUCOSE GEL    STEP 2: RECHECK blood sugar in 15 MINUTES STEP 3: If your blood sugar is still low at the 15 minute recheck --> then, go back to STEP 1 and treat AGAIN with another 15 grams of carbohydrates.

## 2021-08-31 NOTE — Progress Notes (Signed)
Name: Kathleen Acevedo  Age/ Sex: 60 y.o., female   MRN/ DOB: [id number], 1961/01/16     PCP: Ann Held, DO   Reason for Endocrinology Evaluation: Type 2 Diabetes Mellitus  Initial Endocrine Consultative Visit: 01/26/2021    PATIENT IDENTIFIER: [name] is a 60 y.o. female with a past medical history of HTN and T2DM. The patient has followed with Endocrinology clinic since 01/26/2021 for consultative assistance with management of her diabetes.  DIABETIC HISTORY:  [name] was diagnosed with DM in 2008, has been on Victoza in the past without intolerance issues. Her hemoglobin A1c has ranged from 6.5% in 2019, peaking at 12.4% in 2012.   On her initial visit to our clinic her A1c was 7.4 % We started trulicity and continued Metformin, Lantus and jardiance .  Trulicity Donna Bernard were cost prohibitive    SUBJECTIVE:   During the last visit (05/04/2021): A1c 7.2% We continued Metformin, basal insulin , Jardiance and Trulicity   Today (56/12/1495): Kathleen Acevedo  is here for a follow up on diabetes management. She checks her blood sugars 2 times daily. The patient has not had hypoglycemic episodes.  She continues with weight loss  Snacks on fruits and rarely candy , she stopped walking  Denies nausea, vomiting or diarrhea   HOME DIABETES REGIMEN:  Metformin 1000 mg, 1 tablet with Breakfast and 1 tablet with supper Jardiance 25 mg, 1 tablet before Breakfast Lantus  15 units daily  Rybelsus 3 mg daily     Statin: yes ACE-I/ARB: yes Prior Diabetic Education: yes   METER DOWNLOAD SUMMARY: Did not bring    DIABETIC COMPLICATIONS: Microvascular complications:   Denies: CKD, neuropathy, retinopathy Last Eye Exam: Completed 2022  Macrovascular complications:   Denies: CAD, CVA, PVD   HISTORY:  Past Medical History:  Past Medical History:  Diagnosis Date   Diabetes mellitus    Hyperlipidemia    Hypertension    Past Surgical History:  Past Surgical History:   Procedure Laterality Date   ABDOMINAL HYSTERECTOMY     BREAST BIOPSY Left 09/19/2013    Fibrocystic changes and fibroadenomatoid changes    OOPHORECTOMY     Social History:  reports that she has never smoked. She has never used smokeless tobacco. She reports that she does not drink alcohol and does not use drugs. Family History:  Family History  Problem Relation Age of Onset   Diabetes Father    High blood pressure Father    Obesity Father    Colon cancer Neg Hx    Colon polyps Neg Hx      HOME MEDICATIONS: Allergies as of 08/31/2021   No Known Allergies      Medication List        Accurate as of August 31, 2021  8:12 AM. If you have any questions, ask your nurse or doctor.          ferrous sulfate 325 (65 FE) MG tablet Take 1 tablet (325 mg total) by mouth 2 (two) times daily.   Jardiance 25 MG Tabs tablet Generic drug: empagliflozin Take 1 tablet by mouth once daily   Lantus SoloStar 100 UNIT/ML Solostar Pen Generic drug: insulin glargine Inject 15 Units into the skin daily.   lisinopril-hydrochlorothiazide 10-12.5 MG tablet Commonly known as: ZESTORETIC Take 1 tablet by mouth daily.   metFORMIN 1000 MG tablet Commonly known as: GLUCOPHAGE TAKE 1 TABLET BY MOUTH TWICE DAILY WITH MEALS   rosuvastatin 5 MG tablet Commonly known  as: Crestor Take 1 tablet (5 mg total) by mouth daily.   Rybelsus 3 MG Tabs Generic drug: Semaglutide Take 3 mg by mouth daily in the afternoon.   Vitamin D (Ergocalciferol) 1.25 MG (50000 UNIT) Caps capsule Commonly known as: DRISDOL Take  One every other week         OBJECTIVE:   Vital Signs: BP 134/80 (BP Location: Left Arm, Patient Position: Sitting, Cuff Size: Small)   Pulse 68   Ht 5\' 8"  (1.727 m)   Wt 213 lb (96.6 kg)   SpO2 95%   BMI 32.39 kg/m   Wt Readings from Last 3 Encounters:  08/31/21 213 lb (96.6 kg)  07/26/21 215 lb (97.5 kg)  05/04/21 216 lb (98 kg)     Exam: General: Pt appears well and  is in NAD  Lungs: Clear with good BS bilat with no rales, rhonchi, or wheezes  Heart: RRR   Abdomen: Normoactive bowel sounds, soft, nontender, without masses or organomegaly palpable  Extremities: No pretibial edema.   Neuro: MS is good with appropriate affect, pt is alert and Ox3      DM foot exam: 01/26/2021   The skin of the feet is intact without sores or ulcerations. The pedal pulses are 2+ on right and 2+ on left. The sensation is intact to a screening 5.07, 10 gram monofilament bilaterally     DATA REVIEWED:  Lab Results  Component Value Date   HGBA1C 7.9 (A) 08/31/2021   HGBA1C 7.2 (A) 05/04/2021   HGBA1C 7.4 (A) 01/26/2021   Lab Results  Component Value Date   MICROALBUR <0.7 02/14/2020   LDLCALC 77 07/26/2021   CREATININE 1.08 07/26/2021   Lab Results  Component Value Date   MICRALBCREAT 1.1 02/14/2020     Lab Results  Component Value Date   CHOL 147 07/26/2021   HDL 44.00 07/26/2021   LDLCALC 77 07/26/2021   LDLDIRECT 103.6 08/23/2012   TRIG 128.0 07/26/2021   CHOLHDL 3 07/26/2021         ASSESSMENT / PLAN / RECOMMENDATIONS:   1) Type 2 Diabetes Mellitus, Sub-Optimally controlled, Without complications - Most recent A1c of 7.9 %. Goal A1c < 7.0 %.    - Praised pt on weight loss , A1c continues to trend up , pt attributes this to lack of exercise  - We also discussed low carb snack options  - Pt is motivated to go back to lifestyle changes   MEDICATIONS: Continue Metformin 1000 mg, 1 tablet with Breakfast and 1 tablet with supper - Continue Jardiance 25 mg, 1 tablet before Breakfast  - Increase Rybelsus 7 mg daily  - Continue  lantus  15  units daily   EDUCATION / INSTRUCTIONS: BG monitoring instructions: Patient is instructed to check her blood sugars 2 times a day, fasting and bedtime . Call [facility] if: BG persistently < 70  I reviewed the Rule of 15 for the treatment of hypoglycemia in detail with the patient.  Literature supplied.   2) Diabetic complications:  Eye: Does not have known diabetic retinopathy.  Neuro/ Feet: Does not have known diabetic peripheral neuropathy .  Renal: Patient does not have known baseline CKD. She   is not on an ACEI/ARB at present.    F/U in  4 months     Signed electronically by: Mack Guise, MD  [facility]  [facility] [address] [place], [place] [address] Phone: [phone] FAX: [phone]  CC: Ann Held, DO [address] [place] [address] Phone: [phone]  Fax: [phone]  Return to Endocrinology clinic as below: Future Appointments  Date Time Provider [place]  09/22/2021  9:20 AM [facility] 2 [facility]

## 2021-09-22 ENCOUNTER — Ambulatory Visit
Admission: RE | Admit: 2021-09-22 | Discharge: 2021-09-22 | Disposition: A | Discharge status: Home / Self Care | Payer: PRIVATE HEALTH INSURANCE | Source: Ambulatory Visit | Attending: Family Medicine | Admitting: Family Medicine

## 2021-09-22 DIAGNOSIS — Z1231 Encounter for screening mammogram for malignant neoplasm of breast: Secondary | ICD-10-CM

## 2021-09-28 ENCOUNTER — Other Ambulatory Visit: Payer: Self-pay

## 2021-09-28 ENCOUNTER — Encounter: Payer: Self-pay | Admitting: Family Medicine

## 2021-09-28 ENCOUNTER — Ambulatory Visit: Payer: PRIVATE HEALTH INSURANCE | Admitting: Family Medicine

## 2021-09-28 VITALS — BP 160/80 | HR 70 | Temp 98.3°F | Resp 18 | Ht 68.0 in | Wt 216.0 lb

## 2021-09-28 DIAGNOSIS — L0211 Cutaneous abscess of neck: Secondary | ICD-10-CM | POA: Diagnosis not present

## 2021-09-28 DIAGNOSIS — E559 Vitamin D deficiency, unspecified: Secondary | ICD-10-CM | POA: Diagnosis not present

## 2021-09-28 MED ORDER — DOXYCYCLINE HYCLATE 100 MG PO TABS: 100.0000 mg | ORAL_TABLET | Freq: Two times a day (BID) | ORAL | 0 refills | Status: DC

## 2021-09-28 MED ORDER — CEFTRIAXONE SODIUM 1 G IJ SOLR
1.0000 g | Freq: Once | INTRAMUSCULAR | Status: AC
  Administered 2021-09-28: 1 g via INTRAMUSCULAR

## 2021-09-28 MED ORDER — VITAMIN D (ERGOCALCIFEROL) 1.25 MG (50000 UNIT) PO CAPS: ORAL_CAPSULE | ORAL | 2 refills | Status: DC

## 2021-09-28 NOTE — Progress Notes (Signed)
Established Patient Office Visit  Subjective:  Patient ID: Kathleen Acevedo, female    DOB: 1961/10/17  Age: 60 y.o. MRN: [id number]  CC:  Chief Complaint  Patient presents with  . Mass    Left side, x1 week, Pt states it abscess is draining and has some pain.  Pt states it may have started out as a in grown hair.     HPI Kathleen Acevedo presents for abscess on L side of her face.  She first noticed it just prior to Thanksgiving and yesterday it started draining.   It is painful.   No fevers.  No other compliants   Past Medical History:  Diagnosis Date  . Diabetes mellitus   . Hyperlipidemia   . Hypertension     Past Surgical History:  Procedure Laterality Date  . ABDOMINAL HYSTERECTOMY    . BREAST BIOPSY Left 09/19/2013    Fibrocystic changes and fibroadenomatoid changes   . BREAST BIOPSY Right 09/11/2020   fibroadenoma with calcs  . OOPHORECTOMY      Family History  Problem Relation Age of Onset  . Diabetes Father   . High blood pressure Father   . Obesity Father   . Colon cancer Neg Hx   . Colon polyps Neg Hx     Social History   Socioeconomic History  . Marital status: Married    Spouse name: Malaka Ruffner  . Number of children: 1  . Years of education: Not on file  . Highest education level: Not on file  Occupational History  . Occupation: Therapist, music  Tobacco Use  . Smoking status: Never  . Smokeless tobacco: Never  Vaping Use  . Vaping Use: Never used  Substance and Sexual Activity  . Alcohol use: No    Alcohol/week: 0.0 standard drinks  . Drug use: No  . Sexual activity: Yes    Partners: Male  Other Topics Concern  . Not on file  Social History Narrative   Exercise --  4-5 days a week-- walking    Social Determinants of Health   Financial Resource Strain: Not on file  Food Insecurity: Not on file  Transportation Needs: Not on file  Physical Activity: Not on file  Stress: Not on file  Social Connections: Not on file  Intimate Partner  Violence: Not on file    Outpatient Medications Prior to Visit  Medication Sig Dispense Refill  . ferrous sulfate 325 (65 FE) MG tablet Take 1 tablet (325 mg total) by mouth 2 (two) times daily.    . insulin glargine (LANTUS SOLOSTAR) 100 UNIT/ML Solostar Pen Inject 15 Units into the skin daily. 15 mL 2  . JARDIANCE 25 MG TABS tablet Take 1 tablet by mouth once daily 30 tablet 2  . lisinopril-hydrochlorothiazide (ZESTORETIC) 10-12.5 MG tablet Take 1 tablet by mouth daily. 90 tablet 0  . metFORMIN (GLUCOPHAGE) 1000 MG tablet TAKE 1 TABLET BY MOUTH TWICE DAILY WITH MEALS 180 tablet 0  . rosuvastatin (CRESTOR) 5 MG tablet Take 1 tablet (5 mg total) by mouth daily. 90 tablet 1  . Semaglutide (RYBELSUS) 7 MG TABS Take 7 mg by mouth daily. 90 tablet 2  . Vitamin D, Ergocalciferol, (DRISDOL) 1.25 MG (50000 UNIT) CAPS capsule Take  One every other week 12 capsule 2   No facility-administered medications prior to visit.    No Known Allergies  ROS Review of Systems  Constitutional:  Negative for appetite change, diaphoresis, fatigue and unexpected weight change.  Eyes:  Negative for pain, redness and visual disturbance.  Respiratory:  Negative for cough, chest tightness, shortness of breath and wheezing.   Cardiovascular:  Negative for chest pain, palpitations and leg swelling.  Endocrine: Negative for cold intolerance, heat intolerance, polydipsia, polyphagia and polyuria.  Genitourinary:  Negative for difficulty urinating, dysuria and frequency.  Skin:        Abscess L side of face   Neurological:  Negative for dizziness, light-headedness, numbness and headaches.     Objective:    Physical Exam Vitals and nursing note reviewed.  Constitutional:      Appearance: She is well-developed.  HENT:     Head: Normocephalic and atraumatic.  Eyes:     Conjunctiva/sclera: Conjunctivae normal.  Neck:     Thyroid: No thyromegaly.     Vascular: No carotid bruit or JVD.      Comments: Abscess L  side neck About 2 inch diameter  Draining pus Culture done  Min pressure applied  and bandage applied Cardiovascular:     Rate and Rhythm: Normal rate and regular rhythm.     Heart sounds: Normal heart sounds. No murmur heard. Pulmonary:     Effort: Pulmonary effort is normal. No respiratory distress.     Breath sounds: Normal breath sounds. No wheezing or rales.  Chest:     Chest wall: No tenderness.  Musculoskeletal:     Cervical back: Normal range of motion and neck supple.  Skin:    Findings: Erythema and lesion present.  Neurological:     Mental Status: She is alert and oriented to person, place, and time.   BP (!) 160/80 (BP Location: Right Arm, Patient Position: Sitting, Cuff Size: Normal)   Pulse 70   Temp 98.3 F (36.8 C) (Oral)   Resp 18   Ht 5\' 8"  (1.727 m)   Wt 216 lb (98 kg)   SpO2 98%   BMI 32.84 kg/m  Wt Readings from Last 3 Encounters:  09/28/21 216 lb (98 kg)  08/31/21 213 lb (96.6 kg)  07/26/21 215 lb (97.5 kg)     Health Maintenance Due  Topic Date Due  . HIV Screening  Never done  . Zoster Vaccines- Shingrix (1 of 2) Never done  . OPHTHALMOLOGY EXAM  04/28/2018  . TETANUS/TDAP  12/01/2018  . COVID-19 Vaccine (3 - Booster for Pfizer series) 04/14/2020  . Pneumococcal Vaccine 24-96 Years old (2 - PCV) 05/29/2020  . FOOT EXAM  06/02/2021    There are no preventive care reminders to display for this patient.  Lab Results  Component Value Date   TSH 1.78 02/14/2020   Lab Results  Component Value Date   WBC 3.8 (L) 02/14/2020   HGB 12.1 02/14/2020   HCT 36.2 02/14/2020   MCV 89.0 02/14/2020   PLT 244.0 02/14/2020   Lab Results  Component Value Date   NA 142 07/26/2021   K 4.2 07/26/2021   CO2 29 07/26/2021   GLUCOSE 66 (L) 07/26/2021   BUN 21 07/26/2021   CREATININE 1.08 07/26/2021   BILITOT 0.4 07/26/2021   ALKPHOS 52 07/26/2021   AST 12 07/26/2021   ALT 12 07/26/2021   PROT 6.3 07/26/2021   ALBUMIN 3.9 07/26/2021   CALCIUM  9.4 07/26/2021   GFR 55.25 (L) 07/26/2021   Lab Results  Component Value Date   CHOL 147 07/26/2021   Lab Results  Component Value Date   HDL 44.00 07/26/2021   Lab Results  Component Value Date   LDLCALC 77  07/26/2021   Lab Results  Component Value Date   TRIG 128.0 07/26/2021   Lab Results  Component Value Date   CHOLHDL 3 07/26/2021   Lab Results  Component Value Date   HGBA1C 7.9 (A) 08/31/2021      Assessment & Plan:   Problem List Items Addressed This Visit   None Visit Diagnoses     Abscess, neck    -  Primary   Relevant Medications   doxycycline (VIBRA-TABS) 100 MG tablet   cefTRIAXone (ROCEPHIN) injection 1 g (Completed)   Other Relevant Orders   Ambulatory referral to General Surgery   Wound culture   Vitamin D deficiency       Relevant Medications   Vitamin D, Ergocalciferol, (DRISDOL) 1.25 MG (50000 UNIT) CAPS capsule       Meds ordered this encounter  Medications  . Vitamin D, Ergocalciferol, (DRISDOL) 1.25 MG (50000 UNIT) CAPS capsule    Sig: Take  One every other week    Dispense:  12 capsule    Refill:  2  . doxycycline (VIBRA-TABS) 100 MG tablet    Sig: Take 1 tablet (100 mg total) by mouth 2 (two) times daily.    Dispense:  20 tablet    Refill:  0  . cefTRIAXone (ROCEPHIN) injection 1 g    Follow-up: No follow-ups on file.    Ann Held, DO

## 2021-09-29 ENCOUNTER — Other Ambulatory Visit: Payer: Self-pay

## 2021-09-29 DIAGNOSIS — L0211 Cutaneous abscess of neck: Secondary | ICD-10-CM | POA: Insufficient documentation

## 2021-09-29 NOTE — Patient Instructions (Signed)
Skin Abscess  A skin abscess is an infected area on or under your skin that contains a collection of pus and other material. An abscess may also be called a furuncle,carbuncle, or boil. An abscess can occur in or on almost any part of your body. Some abscesses break open (rupture) on their own. Most continue to get worse unless they are treated. The infection can spread deeper into the body and eventually into your blood, whichcan make you feel ill. Treatment usually involves draining the abscess. What are the causes? An abscess occurs when germs, like bacteria, pass through your skin and cause an infection. This may be caused by: A scrape or cut on your skin. A puncture wound through your skin, including a needle injection or insect bite. Blocked oil or sweat glands. Blocked and infected hair follicles. A cyst that forms beneath your skin (sebaceous cyst) and becomes infected. What increases the risk? This condition is more likely to develop in people who: Have a weak body defense system (immune system). Have diabetes. Have dry and irritated skin. Get frequent injections or use illegal IV drugs. Have a foreign body in a wound, such as a splinter. Have problems with their lymph system or veins. What are the signs or symptoms? Symptoms of this condition include: A painful, firm bump under the skin. A bump with pus at the top. This may break through the skin and drain. Other symptoms include: Redness surrounding the abscess site. Warmth. Swelling of the lymph nodes (glands) near the abscess. Tenderness. A sore on the skin. How is this diagnosed? This condition may be diagnosed based on: A physical exam. Your medical history. A sample of pus. This may be used to find out what is causing the infection. Blood tests. Imaging tests, such as an ultrasound, CT scan, or MRI. How is this treated? A small abscess that drains on its own may not need treatment. Treatment for larger abscesses  may include: Moist heat or heat pack applied to the area several times a day. A procedure to drain the abscess (incision and drainage). Antibiotic medicines. For a severe abscess, you may first get antibiotics through an IV and then change to antibiotics by mouth. Follow these instructions at home: Medicines  Take over-the-counter and prescription medicines only as told by your health care provider. If you were prescribed an antibiotic medicine, take it as told by your health care provider. Do not stop taking the antibiotic even if you start to feel better.  Abscess care  If you have an abscess that has not drained, apply heat to the affected area. Use the heat source that your health care provider recommends, such as a moist heat pack or a heating pad. Place a towel between your skin and the heat source. Leave the heat on for 20-30 minutes. Remove the heat if your skin turns bright red. This is especially important if you are unable to feel pain, heat, or cold. You may have a greater risk of getting burned. Follow instructions from your health care provider about how to take care of your abscess. Make sure you: Cover the abscess with a bandage (dressing). Change your dressing or gauze as told by your health care provider. Wash your hands with soap and water before you change the dressing or gauze. If soap and water are not available, use hand sanitizer. Check your abscess every day for signs of a worsening infection. Check for: More redness, swelling, or pain. More fluid or blood. Warmth. More   pus or a bad smell.  General instructions To avoid spreading the infection: Do not share personal care items, towels, or hot tubs with others. Avoid making skin contact with other people. Keep all follow-up visits as told by your health care provider. This is important. Contact a health care provider if you have: More redness, swelling, or pain around your abscess. More fluid or blood coming  from your abscess. Warm skin around your abscess. More pus or a bad smell coming from your abscess. A fever. Muscle aches. Chills or a general ill feeling. Get help right away if you: Have severe pain. See red streaks on your skin spreading away from the abscess. Summary A skin abscess is an infected area on or under your skin that contains a collection of pus and other material. A small abscess that drains on its own may not need treatment. Treatment for larger abscesses may include having a procedure to drain the abscess and taking an antibiotic. This information is not intended to replace advice given to you by your health care provider. Make sure you discuss any questions you have with your healthcare provider. Document Revised: 02/07/2019 Document Reviewed: 11/30/2017 Elsevier Patient Education  2022 [employer]  

## 2021-09-29 NOTE — Assessment & Plan Note (Signed)
Rocephin 1 gm IM abx per orders Refer to surgery for further eval if not healing

## 2021-10-01 LAB — WOUND CULTURE
MICRO NUMBER:: 12690018
SPECIMEN QUALITY:: ADEQUATE

## 2021-10-03 ENCOUNTER — Other Ambulatory Visit: Payer: Self-pay | Admitting: Family Medicine

## 2021-10-03 DIAGNOSIS — E119 Type 2 diabetes mellitus without complications: Secondary | ICD-10-CM

## 2021-10-04 ENCOUNTER — Encounter: Payer: Self-pay | Admitting: Family Medicine

## 2021-10-20 ENCOUNTER — Other Ambulatory Visit: Payer: Self-pay | Admitting: Internal Medicine

## 2021-11-02 LAB — HM DIABETES EYE EXAM

## 2021-11-03 ENCOUNTER — Encounter: Payer: Self-pay | Admitting: Family Medicine

## 2021-11-25 ENCOUNTER — Other Ambulatory Visit: Payer: Self-pay | Admitting: Family Medicine

## 2021-11-25 DIAGNOSIS — E119 Type 2 diabetes mellitus without complications: Secondary | ICD-10-CM

## 2021-11-25 DIAGNOSIS — I10 Essential (primary) hypertension: Secondary | ICD-10-CM

## 2021-12-31 ENCOUNTER — Ambulatory Visit: Payer: PRIVATE HEALTH INSURANCE | Admitting: Family Medicine

## 2022-01-04 ENCOUNTER — Ambulatory Visit: Payer: PRIVATE HEALTH INSURANCE | Admitting: Internal Medicine

## 2022-01-14 ENCOUNTER — Encounter: Payer: Self-pay | Admitting: Family Medicine

## 2022-01-14 ENCOUNTER — Ambulatory Visit: Payer: PRIVATE HEALTH INSURANCE | Admitting: Family Medicine

## 2022-01-14 VITALS — BP 118/70 | HR 66 | Temp 98.4°F | Resp 18 | Ht 68.0 in | Wt 215.0 lb

## 2022-01-14 DIAGNOSIS — E559 Vitamin D deficiency, unspecified: Secondary | ICD-10-CM | POA: Diagnosis not present

## 2022-01-14 DIAGNOSIS — I10 Essential (primary) hypertension: Secondary | ICD-10-CM | POA: Diagnosis not present

## 2022-01-14 DIAGNOSIS — E119 Type 2 diabetes mellitus without complications: Secondary | ICD-10-CM

## 2022-01-14 DIAGNOSIS — E785 Hyperlipidemia, unspecified: Secondary | ICD-10-CM | POA: Diagnosis not present

## 2022-01-14 DIAGNOSIS — L720 Epidermal cyst: Secondary | ICD-10-CM

## 2022-01-14 DIAGNOSIS — E1169 Type 2 diabetes mellitus with other specified complication: Secondary | ICD-10-CM

## 2022-01-14 DIAGNOSIS — Z23 Encounter for immunization: Secondary | ICD-10-CM | POA: Diagnosis not present

## 2022-01-14 LAB — CBC WITH DIFFERENTIAL/PLATELET
Basophils Absolute: 0 10*3/uL (ref 0.0–0.1)
Basophils Relative: 0.5 % (ref 0.0–3.0)
Eosinophils Absolute: 0.2 10*3/uL (ref 0.0–0.7)
Eosinophils Relative: 3.9 % (ref 0.0–5.0)
HCT: 37.3 % (ref 36.0–46.0)
Hemoglobin: 12 g/dL (ref 12.0–15.0)
Lymphocytes Relative: 27 % (ref 12.0–46.0)
Lymphs Abs: 1.3 10*3/uL (ref 0.7–4.0)
MCHC: 32.3 g/dL (ref 30.0–36.0)
MCV: 90 fl (ref 78.0–100.0)
Monocytes Absolute: 0.4 10*3/uL (ref 0.1–1.0)
Monocytes Relative: 7.6 % (ref 3.0–12.0)
Neutro Abs: 3 10*3/uL (ref 1.4–7.7)
Neutrophils Relative %: 61 % (ref 43.0–77.0)
Platelets: 238 10*3/uL (ref 150.0–400.0)
RBC: 4.14 Mil/uL (ref 3.87–5.11)
RDW: 14.3 % (ref 11.5–15.5)
WBC: 4.9 10*3/uL (ref 4.0–10.5)

## 2022-01-14 LAB — COMPREHENSIVE METABOLIC PANEL
ALT: 14 U/L (ref 0–35)
AST: 13 U/L (ref 0–37)
Albumin: 4 g/dL (ref 3.5–5.2)
Alkaline Phosphatase: 59 U/L (ref 39–117)
BUN: 23 mg/dL (ref 6–23)
CO2: 31 mEq/L (ref 19–32)
Calcium: 9.4 mg/dL (ref 8.4–10.5)
Chloride: 103 mEq/L (ref 96–112)
Creatinine, Ser: 1.05 mg/dL (ref 0.40–1.20)
GFR: 56.96 mL/min — ABNORMAL LOW (ref >60.00)
Glucose, Bld: 132 mg/dL — ABNORMAL HIGH (ref 70–99)
Potassium: 4.3 mEq/L (ref 3.5–5.1)
Sodium: 140 mEq/L (ref 135–145)
Total Bilirubin: 0.5 mg/dL (ref 0.2–1.2)
Total Protein: 6.4 g/dL (ref 6.0–8.3)

## 2022-01-14 LAB — MICROALBUMIN / CREATININE URINE RATIO
Creatinine,U: 92.5 mg/dL
Microalb Creat Ratio: 0.8 mg/g (ref 0.0–30.0)
Microalb, Ur: <0.7 mg/dL (ref 0.0–1.9)

## 2022-01-14 LAB — LIPID PANEL
Cholesterol: 128 mg/dL (ref 0–200)
HDL: 49.5 mg/dL (ref >39.00)
LDL Cholesterol: 52 mg/dL (ref 0–99)
NonHDL: 78.06
Total CHOL/HDL Ratio: 3
Triglycerides: 128 mg/dL (ref 0.0–149.0)
VLDL: 25.6 mg/dL (ref 0.0–40.0)

## 2022-01-14 LAB — HEMOGLOBIN A1C: Hgb A1c MFr Bld: 8 % — ABNORMAL HIGH (ref 4.6–6.5)

## 2022-01-14 LAB — VITAMIN D 25 HYDROXY (VIT D DEFICIENCY, FRACTURES): VITD: 58.43 ng/mL (ref 30.00–100.00)

## 2022-01-14 MED ORDER — VITAMIN D (ERGOCALCIFEROL) 1.25 MG (50000 UNIT) PO CAPS: ORAL_CAPSULE | ORAL | 2 refills | Status: DC

## 2022-01-14 MED ORDER — DOXYCYCLINE HYCLATE 100 MG PO TABS: 100.0000 mg | ORAL_TABLET | Freq: Two times a day (BID) | ORAL | 0 refills | Status: DC

## 2022-01-14 MED ORDER — METFORMIN HCL 1000 MG PO TABS: 1000.0000 mg | ORAL_TABLET | Freq: Two times a day (BID) | ORAL | 1 refills | Status: DC

## 2022-01-14 MED ORDER — RYBELSUS 7 MG PO TABS: 7.0000 mg | ORAL_TABLET | Freq: Every day | ORAL | 1 refills | Status: DC

## 2022-01-14 MED ORDER — LISINOPRIL-HYDROCHLOROTHIAZIDE 10-12.5 MG PO TABS: 1.0000 | ORAL_TABLET | Freq: Every day | ORAL | 1 refills | Status: DC

## 2022-01-14 MED ORDER — ROSUVASTATIN CALCIUM 5 MG PO TABS: 5.0000 mg | ORAL_TABLET | Freq: Every day | ORAL | 1 refills | Status: DC

## 2022-01-14 MED ORDER — EMPAGLIFLOZIN 25 MG PO TABS: 25.0000 mg | ORAL_TABLET | Freq: Every day | ORAL | 1 refills | Status: DC

## 2022-01-14 NOTE — Assessment & Plan Note (Signed)
Encourage heart healthy diet such as MIND or DASH diet, increase exercise, avoid trans fats, simple carbohydrates and processed foods, consider a krill or fish or flaxseed oil cap daily.  °

## 2022-01-14 NOTE — Progress Notes (Signed)
? ?Subjective:  ? ?By signing my name below, I, Zite Okoli, attest that this documentation has been prepared under the direction and in the presence of Ann Held, DO. 01/14/2022  ? ? Patient ID: Kathleen Acevedo, female    DOB: May 17, 1961, 61 y.o.   MRN: [id number] ? ?Chief Complaint  ?Patient presents with  ? Hypertension  ? Hyperlipidemia  ? Follow-up  ? ? ?HPI ?Patient is in today for an office visit and follow-up. ? ?She cannot see her endocrinologist anymore because she moved. ? ?She checks her blood sugar levels at home and notes they are always high at night. She is careful about snacking. She used to see a nutritionist years ago. ? ?At her last visit, she was given antibiotics to treat a cyst on her neck. The cyst was drained but came back because she did not complete her course of antibiotics. ? ?She is UTD on vision care. ? ?She has 2 Covid-19 vaccines. She is not interested in the shingles vaccine. She will receive the tetanus vaccine today. ? ?Past Medical History:  ?Diagnosis Date  ? Diabetes mellitus   ? Hyperlipidemia   ? Hypertension   ? ? ?Past Surgical History:  ?Procedure Laterality Date  ? ABDOMINAL HYSTERECTOMY    ? BREAST BIOPSY Left 09/19/2013  ?  Fibrocystic changes and fibroadenomatoid changes   ? BREAST BIOPSY Right 09/11/2020  ? fibroadenoma with calcs  ? OOPHORECTOMY    ? ? ?Family History  ?Problem Relation Age of Onset  ? Diabetes Father   ? High blood pressure Father   ? Obesity Father   ? Colon cancer Neg Hx   ? Colon polyps Neg Hx   ? ? ?Social History  ? ?Socioeconomic History  ? Marital status: Married  ?  Spouse name: Ezra Marquess  ? Number of children: 1  ? Years of education: Not on file  ? Highest education level: Not on file  ?Occupational History  ? Occupation: Therapist, music  ?Tobacco Use  ? Smoking status: Never  ? Smokeless tobacco: Never  ?Vaping Use  ? Vaping Use: Never used  ?Substance and Sexual Activity  ? Alcohol use: No  ?  Alcohol/week: 0.0 standard drinks   ? Drug use: No  ? Sexual activity: Yes  ?  Partners: Male  ?Other Topics Concern  ? Not on file  ?Social History Narrative  ? Exercise --  4-5 days a week-- walking   ? ?Social Determinants of Health  ? ?Financial Resource Strain: Not on file  ?Food Insecurity: Not on file  ?Transportation Needs: Not on file  ?Physical Activity: Not on file  ?Stress: Not on file  ?Social Connections: Not on file  ?Intimate Partner Violence: Not on file  ? ? ?Outpatient Medications Prior to Visit  ?Medication Sig Dispense Refill  ? doxycycline (VIBRA-TABS) 100 MG tablet Take 1 tablet (100 mg total) by mouth 2 (two) times daily. 20 tablet 0  ? ferrous sulfate 325 (65 FE) MG tablet Take 1 tablet (325 mg total) by mouth 2 (two) times daily.    ? LANTUS SOLOSTAR 100 UNIT/ML Solostar Pen INJECT 15 UNITS SUBCUTANEOUSLY ONCE DAILY 15 mL 0  ? JARDIANCE 25 MG TABS tablet Take 1 tablet by mouth once daily 30 tablet 5  ? lisinopril-hydrochlorothiazide (ZESTORETIC) 10-12.5 MG tablet Take 1 tablet by mouth once daily 90 tablet 0  ? metFORMIN (GLUCOPHAGE) 1000 MG tablet TAKE 1 TABLET BY MOUTH TWICE DAILY WITH MEALS 180 tablet 0  ?  rosuvastatin (CRESTOR) 5 MG tablet Take 1 tablet (5 mg total) by mouth daily. 90 tablet 1  ? Semaglutide (RYBELSUS) 7 MG TABS Take 7 mg by mouth daily. 90 tablet 2  ? Vitamin D, Ergocalciferol, (DRISDOL) 1.25 MG (50000 UNIT) CAPS capsule Take  One every other week 12 capsule 2  ? ?No facility-administered medications prior to visit.  ? ? ?No Known Allergies ? ?Review of Systems  ?Constitutional:  Negative for fever.  ?HENT:  Negative for congestion, ear pain, hearing loss, sinus pain and sore throat.   ?Eyes:  Negative for blurred vision and pain.  ?Respiratory:  Negative for cough, sputum production, shortness of breath and wheezing.   ?Cardiovascular:  Negative for chest pain and palpitations.  ?Gastrointestinal:  Negative for blood in stool, constipation, diarrhea, nausea and vomiting.  ?Genitourinary:  Negative for  dysuria, frequency, hematuria and urgency.  ?Musculoskeletal:  Negative for back pain, falls and myalgias.  ?Skin:   ?     (+) cyst on side of left neck  ?Neurological:  Negative for dizziness, sensory change, loss of consciousness, weakness and headaches.  ?Endo/Heme/Allergies:  Negative for environmental allergies. Does not bruise/bleed easily.  ?Psychiatric/Behavioral:  Negative for depression and suicidal ideas. The patient is not nervous/anxious and does not have insomnia.   ? ?   ?Objective:  ?  ?Physical Exam ?Constitutional:   ?   General: She is not in acute distress. ?   Appearance: Normal appearance. She is not ill-appearing.  ?HENT:  ?   Head: Normocephalic and atraumatic.  ?   Right Ear: External ear normal.  ?   Left Ear: External ear normal.  ?Eyes:  ?   Extraocular Movements: Extraocular movements intact.  ?   Pupils: Pupils are equal, round, and reactive to light.  ?Neck:  ?   Comments: Sebaceous cyst on left side of neck ?Cardiovascular:  ?   Rate and Rhythm: Normal rate and regular rhythm.  ?   Pulses: Normal pulses.  ?   Heart sounds: Normal heart sounds. No murmur heard. ?  No gallop.  ?Pulmonary:  ?   Effort: Pulmonary effort is normal. No respiratory distress.  ?   Breath sounds: Normal breath sounds. No wheezing, rhonchi or rales.  ?Abdominal:  ?   General: Bowel sounds are normal. There is no distension.  ?   Palpations: Abdomen is soft. There is no mass.  ?   Tenderness: There is no abdominal tenderness. There is no guarding or rebound.  ?   Hernia: No hernia is present.  ?Musculoskeletal:  ?   Cervical back: Normal range of motion and neck supple.  ?Feet:  ?   Comments: Diabetic Foot Exam - Simple   ?No data filed ?   ?Lymphadenopathy:  ?   Cervical: No cervical adenopathy.  ?Skin: ?   General: Skin is warm and dry.  ?Neurological:  ?   Mental Status: She is alert and oriented to person, place, and time.  ?Psychiatric:     ?   Behavior: Behavior normal.  ? ? ?BP 118/70 (BP Location: Right  Arm, Patient Position: Sitting, Cuff Size: Normal)   Pulse 66   Temp 98.4 ?F (36.9 ?C) (Oral)   Resp 18   Ht '5\' 8"'$  (1.727 m)   Wt 215 lb (97.5 kg)   SpO2 98%   BMI 32.69 kg/m?  ?Wt Readings from Last 3 Encounters:  ?01/14/22 215 lb (97.5 kg)  ?09/28/21 216 lb (98 kg)  ?08/31/21 213 lb (  96.6 kg)  ? ? ?Diabetic Foot Exam - Simple   ?Simple Foot Form ?Diabetic Foot exam was performed with the following findings: Yes 01/14/2022  9:11 AM  ?Visual Inspection ?No deformities, no ulcerations, no other skin breakdown bilaterally: Yes ?Sensation Testing ?Intact to touch and monofilament testing bilaterally: Yes ?Pulse Check ?Posterior Tibialis and Dorsalis pulse intact bilaterally: Yes ?Comments ?  ? ?Lab Results  ?Component Value Date  ? WBC 3.8 (L) 02/14/2020  ? HGB 12.1 02/14/2020  ? HCT 36.2 02/14/2020  ? PLT 244.0 02/14/2020  ? GLUCOSE 66 (L) 07/26/2021  ? CHOL 147 07/26/2021  ? TRIG 128.0 07/26/2021  ? HDL 44.00 07/26/2021  ? LDLDIRECT 103.6 08/23/2012  ? [place] 77 07/26/2021  ? ALT 12 07/26/2021  ? AST 12 07/26/2021  ? NA 142 07/26/2021  ? K 4.2 07/26/2021  ? CL 105 07/26/2021  ? CREATININE 1.08 07/26/2021  ? BUN 21 07/26/2021  ? CO2 29 07/26/2021  ? TSH 1.78 02/14/2020  ? HGBA1C 7.9 (A) 08/31/2021  ? MICROALBUR <0.7 02/14/2020  ? ? ?Lab Results  ?Component Value Date  ? TSH 1.78 02/14/2020  ? ?Lab Results  ?Component Value Date  ? WBC 3.8 (L) 02/14/2020  ? HGB 12.1 02/14/2020  ? HCT 36.2 02/14/2020  ? MCV 89.0 02/14/2020  ? PLT 244.0 02/14/2020  ? ?Lab Results  ?Component Value Date  ? NA 142 07/26/2021  ? K 4.2 07/26/2021  ? CO2 29 07/26/2021  ? GLUCOSE 66 (L) 07/26/2021  ? BUN 21 07/26/2021  ? CREATININE 1.08 07/26/2021  ? BILITOT 0.4 07/26/2021  ? ALKPHOS 52 07/26/2021  ? AST 12 07/26/2021  ? ALT 12 07/26/2021  ? PROT 6.3 07/26/2021  ? ALBUMIN 3.9 07/26/2021  ? CALCIUM 9.4 07/26/2021  ? GFR 55.25 (L) 07/26/2021  ? ?Lab Results  ?Component Value Date  ? CHOL 147 07/26/2021  ? ?Lab Results  ?Component Value  Date  ? HDL 44.00 07/26/2021  ? ?Lab Results  ?Component Value Date  ? [place] 77 07/26/2021  ? ?Lab Results  ?Component Value Date  ? TRIG 128.0 07/26/2021  ? ?Lab Results  ?Component Value Date  ? CHOLHDL 3

## 2022-01-14 NOTE — Assessment & Plan Note (Signed)
hgba1c to be checked, minimize simple carbs. Increase exercise as tolerated. Continue current meds  

## 2022-01-14 NOTE — Patient Instructions (Signed)

## 2022-01-21 ENCOUNTER — Other Ambulatory Visit: Payer: Self-pay | Admitting: Family Medicine

## 2022-01-21 DIAGNOSIS — E1169 Type 2 diabetes mellitus with other specified complication: Secondary | ICD-10-CM

## 2022-01-21 DIAGNOSIS — E1165 Type 2 diabetes mellitus with hyperglycemia: Secondary | ICD-10-CM

## 2022-01-21 MED ORDER — RYBELSUS 14 MG PO TABS
14.0000 mg | ORAL_TABLET | Freq: Every day | ORAL | 2 refills | Status: DC
Start: 2022-01-21 — End: 2022-04-11

## 2022-01-21 NOTE — Addendum Note (Signed)
Addended byDamita Dunnings D on: 01/21/2022 02:07 PM ? ? Modules accepted: Orders ? ?

## 2022-01-25 IMAGING — MG MM DIGITAL DIAGNOSTIC UNILAT*R* W/ TOMO W/ CAD
3 series · 3 of 3 positions shown · non-contrast
Comparison: Previous exam(s).

CLINICAL DATA: Patient was recalled from screening mammogram for
right breast calcifications.

EXAM:
DIGITAL DIAGNOSTIC UNILATERAL RIGHT MAMMOGRAM WITH TOMO AND CAD

[R ML (1 of 2)]
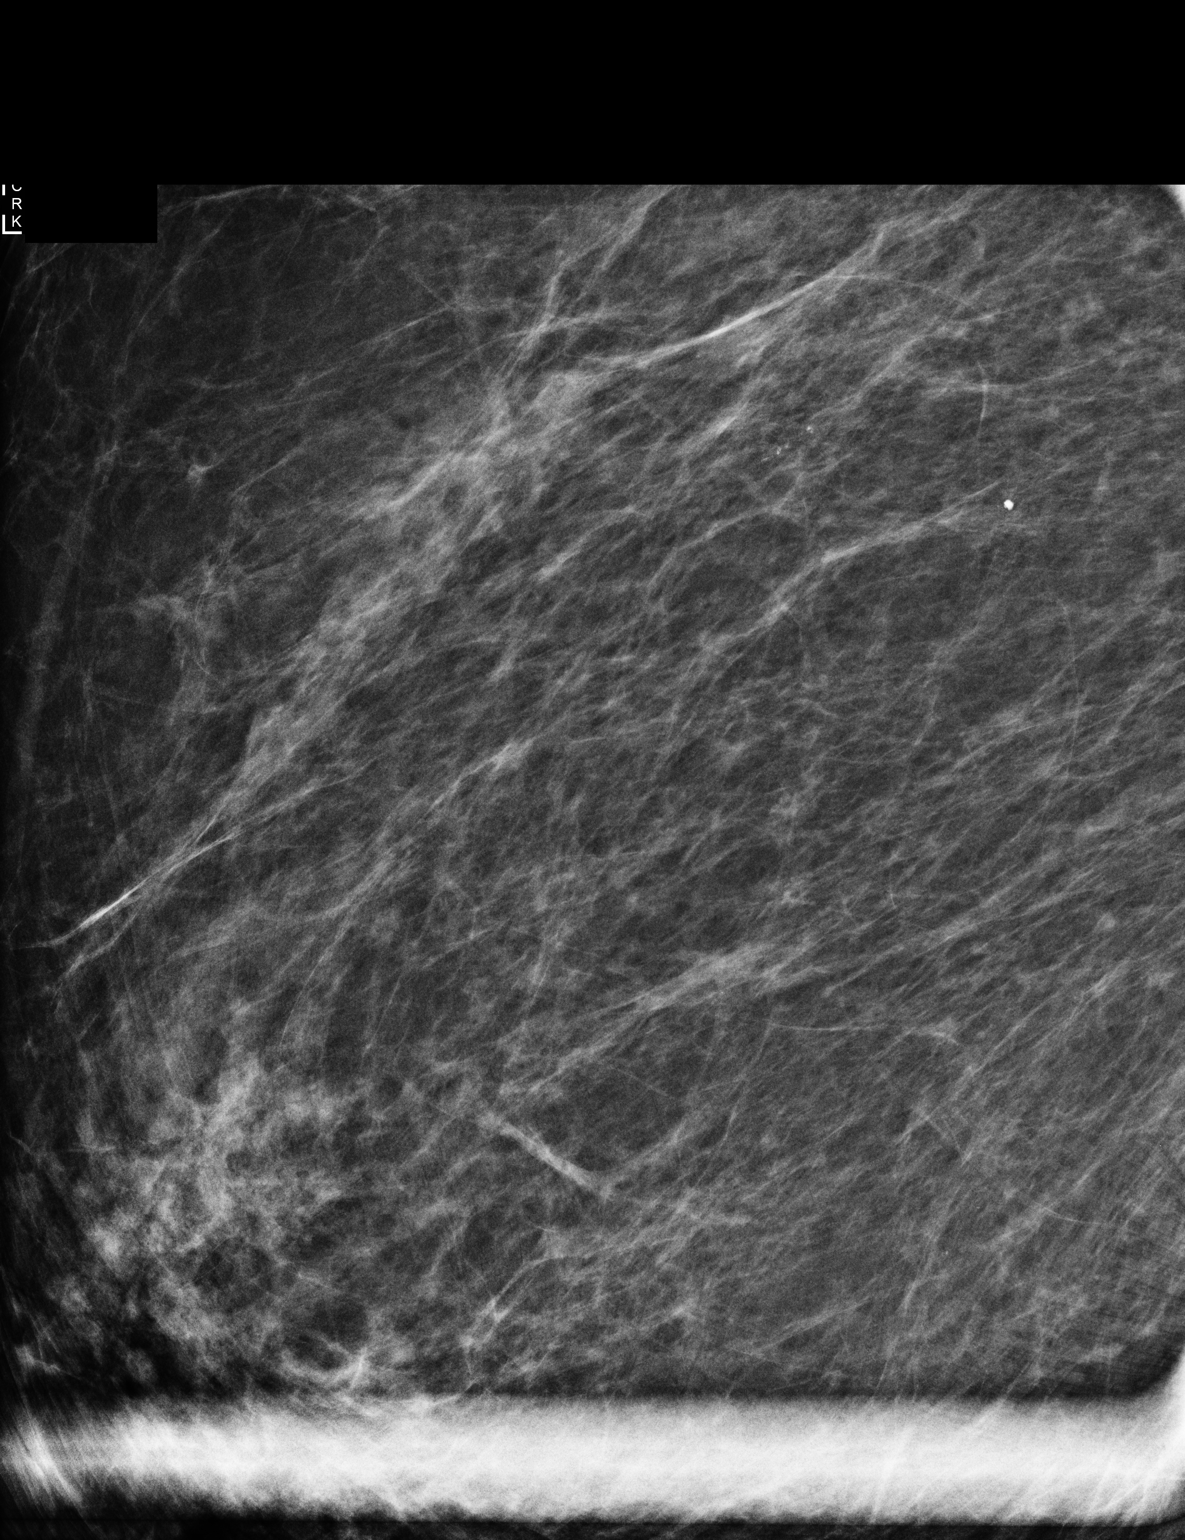

[R ML (2 of 2)]
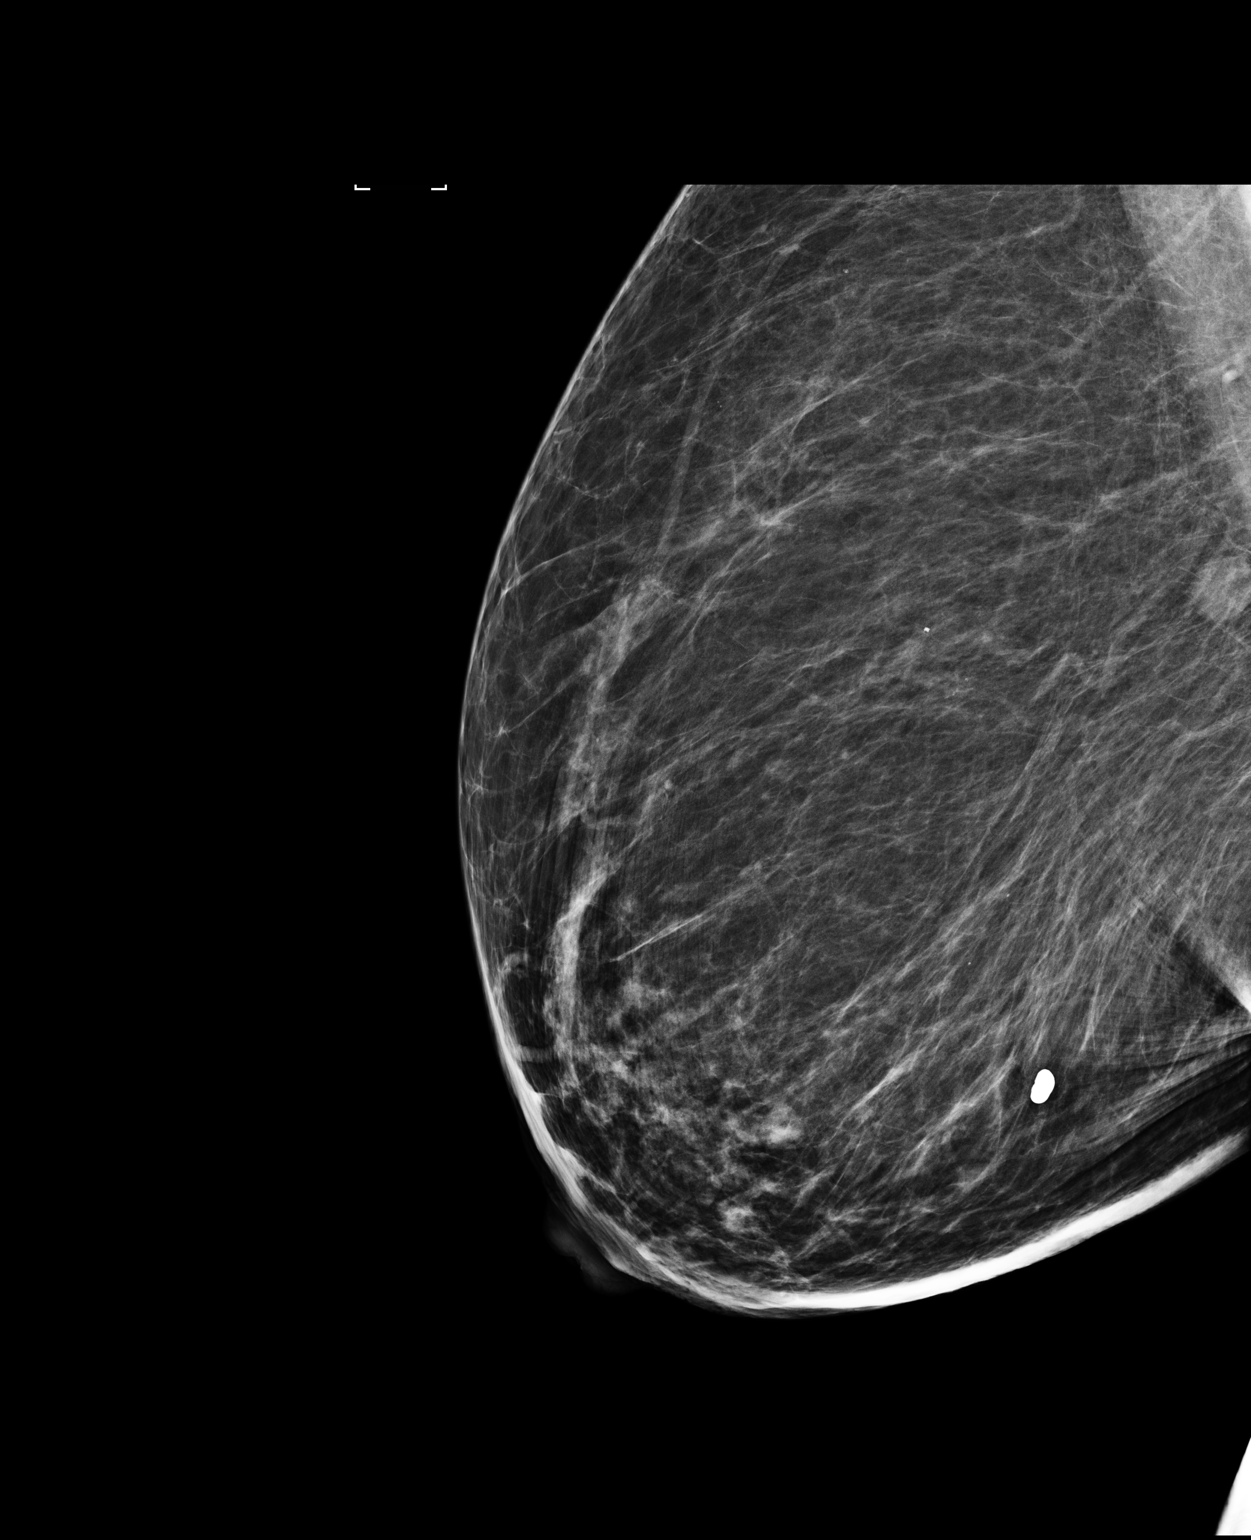

[R CC]
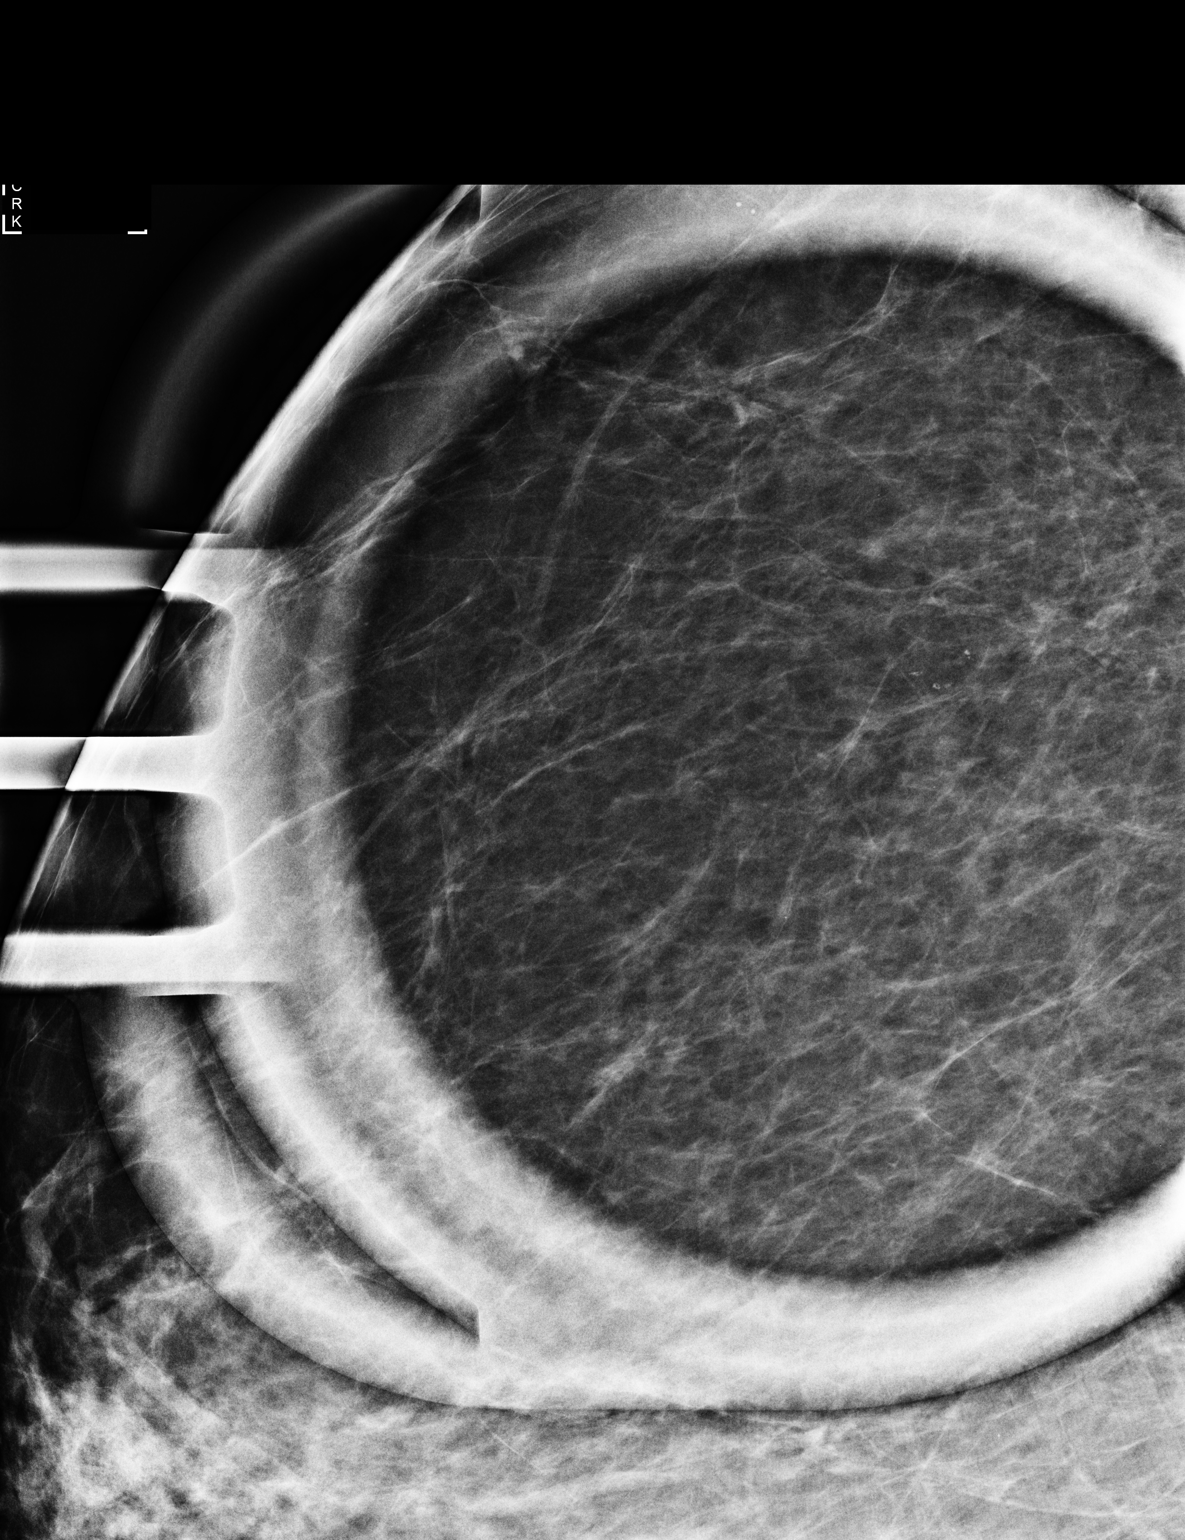

[3 of 3 positions shown; findings below may reference images not displayed]

ACR Breast Density Category b: There are scattered areas of
fibroglandular density.
FINDINGS: Additional imaging of the right breast was obtained. There are
developing punctate grouped calcifications spanning an area of 5 mm
in the upper-outer quadrant . They are indeterminate. There is no
associated mass.

Mammographic images were processed with CAD.
IMPRESSION: Indeterminate calcifications in the upper-outer quadrant of the
right breast.

RECOMMENDATION:
Stereotactic biopsy the indeterminate calcifications in the
upper-outer quadrant of the right breast is recommended.

I have discussed the findings and recommendations with the patient.
If applicable, a reminder letter will be sent to the patient
regarding the next appointment.

BI-RADS CATEGORY  4: Suspicious.

## 2022-03-16 ENCOUNTER — Encounter: Payer: Self-pay | Admitting: Family Medicine

## 2022-03-16 DIAGNOSIS — L0211 Cutaneous abscess of neck: Secondary | ICD-10-CM

## 2022-03-17 NOTE — Telephone Encounter (Signed)
This message was routed back to me w/o a message? Did you want her to come back in?

## 2022-03-21 ENCOUNTER — Other Ambulatory Visit: Payer: Self-pay | Admitting: Family Medicine

## 2022-03-21 DIAGNOSIS — L0211 Cutaneous abscess of neck: Secondary | ICD-10-CM

## 2022-03-21 MED ORDER — DOXYCYCLINE HYCLATE 100 MG PO TABS: 100.0000 mg | ORAL_TABLET | Freq: Two times a day (BID) | ORAL | 0 refills | Status: DC

## 2022-04-04 ENCOUNTER — Encounter: Payer: Self-pay | Admitting: Family Medicine

## 2022-04-10 ENCOUNTER — Other Ambulatory Visit: Payer: Self-pay | Admitting: Family Medicine

## 2022-06-23 ENCOUNTER — Encounter: Payer: Self-pay | Admitting: Family Medicine

## 2022-06-23 ENCOUNTER — Ambulatory Visit: Payer: PRIVATE HEALTH INSURANCE | Admitting: Family Medicine

## 2022-06-23 VITALS — BP 140/84 | HR 62 | Temp 97.8°F | Resp 18 | Ht 68.0 in | Wt 202.2 lb

## 2022-06-23 DIAGNOSIS — L720 Epidermal cyst: Secondary | ICD-10-CM | POA: Diagnosis not present

## 2022-06-23 DIAGNOSIS — E1169 Type 2 diabetes mellitus with other specified complication: Secondary | ICD-10-CM | POA: Diagnosis not present

## 2022-06-23 DIAGNOSIS — E1165 Type 2 diabetes mellitus with hyperglycemia: Secondary | ICD-10-CM | POA: Diagnosis not present

## 2022-06-23 DIAGNOSIS — E119 Type 2 diabetes mellitus without complications: Secondary | ICD-10-CM

## 2022-06-23 DIAGNOSIS — I10 Essential (primary) hypertension: Secondary | ICD-10-CM

## 2022-06-23 DIAGNOSIS — D229 Melanocytic nevi, unspecified: Secondary | ICD-10-CM | POA: Diagnosis not present

## 2022-06-23 DIAGNOSIS — E785 Hyperlipidemia, unspecified: Secondary | ICD-10-CM

## 2022-06-23 DIAGNOSIS — E559 Vitamin D deficiency, unspecified: Secondary | ICD-10-CM | POA: Diagnosis not present

## 2022-06-23 DIAGNOSIS — E7849 Other hyperlipidemia: Secondary | ICD-10-CM

## 2022-06-23 LAB — COMPREHENSIVE METABOLIC PANEL
ALT: 10 U/L (ref 0–35)
AST: 11 U/L (ref 0–37)
Albumin: 3.8 g/dL (ref 3.5–5.2)
Alkaline Phosphatase: 48 U/L (ref 39–117)
BUN: 25 mg/dL — ABNORMAL HIGH (ref 6–23)
CO2: 29 mEq/L (ref 19–32)
Calcium: 9.2 mg/dL (ref 8.4–10.5)
Chloride: 105 mEq/L (ref 96–112)
Creatinine, Ser: 1.07 mg/dL (ref 0.40–1.20)
GFR: 55.51 mL/min — ABNORMAL LOW (ref >60.00)
Glucose, Bld: 96 mg/dL (ref 70–99)
Potassium: 4.4 mEq/L (ref 3.5–5.1)
Sodium: 141 mEq/L (ref 135–145)
Total Bilirubin: 0.4 mg/dL (ref 0.2–1.2)
Total Protein: 6.3 g/dL (ref 6.0–8.3)

## 2022-06-23 LAB — LIPID PANEL
Cholesterol: 131 mg/dL (ref 0–200)
HDL: 46.5 mg/dL (ref >39.00)
LDL Cholesterol: 61 mg/dL (ref 0–99)
NonHDL: 84.22
Total CHOL/HDL Ratio: 3
Triglycerides: 117 mg/dL (ref 0.0–149.0)
VLDL: 23.4 mg/dL (ref 0.0–40.0)

## 2022-06-23 LAB — HEMOGLOBIN A1C: Hgb A1c MFr Bld: 7.4 % — ABNORMAL HIGH (ref 4.6–6.5)

## 2022-06-23 MED ORDER — VITAMIN D (ERGOCALCIFEROL) 1.25 MG (50000 UNIT) PO CAPS: ORAL_CAPSULE | ORAL | 2 refills | Status: DC

## 2022-06-23 MED ORDER — METFORMIN HCL 1000 MG PO TABS
1000.0000 mg | ORAL_TABLET | Freq: Two times a day (BID) | ORAL | 1 refills | Status: DC
Start: 2022-06-23 — End: 2022-08-17

## 2022-06-23 MED ORDER — LANTUS SOLOSTAR 100 UNIT/ML ~~LOC~~ SOPN: PEN_INJECTOR | SUBCUTANEOUS | 6 refills | Status: DC

## 2022-06-23 NOTE — Progress Notes (Signed)
++++++   Established Patient Office Visit  Subjective   Patient ID: Kathleen Acevedo, female    DOB: June 15, 1961  Age: 61 y.o. MRN: [id number]  Chief Complaint  Patient presents with   Hypertension   Hyperlipidemia   Diabetes   Follow-up    HPI  HYPERTENSION  Blood pressure range-not checking   Chest pain- no      Dyspnea- no Lightheadedness- no   Edema- no Other side effects - no   Medication compliance: good Low salt diet- yes   DIABETES  Blood Sugar ranges- 150-200  Polyuria- no New Visual problems- no Hypoglycemic symptoms- no Other side effects-no Medication compliance - good  Last eye exam- recent  Foot exam- today  HYPERLIPIDEMIA  Medication compliance- good RUQ pain- no  Muscle aches- no Other side effects-no  ROS  Review of Systems  Constitutional: Negative for activity change, appetite change and fatigue.  HENT: Negative for hearing loss, congestion, tinnitus and ear discharge.  dentist [id number]: Negative for visual disturbance (see optho q1y -- vision corrected to 20/20 with glasses).  Respiratory: Negative for cough, chest tightness and shortness of breath.   Cardiovascular: Negative for chest pain, palpitations and leg swelling.  Gastrointestinal: Negative for abdominal pain, diarrhea, constipation and abdominal distention.  Genitourinary: Negative for urgency, frequency, decreased urine volume and difficulty urinating.  Musculoskeletal: Negative for back pain, arthralgias and gait problem.  Skin: Negative for color change, pallor and rash.  Neurological: Negative for dizziness, light-headedness, numbness and headaches.  Hematological: Negative for adenopathy. Does not bruise/bleed easily.  Psychiatric/Behavioral: Negative for suicidal ideas, confusion, sleep disturbance, self-injury, dysphoric mood, decreased concentration and agitation.       Patient Active Problem List   Diagnosis Date Noted   Abscess, neck 09/29/2021   Other hyperlipidemia  01/27/2021   Hyperlipidemia associated with type 2 diabetes mellitus ([facility] 11/05/2018   Diabetes mellitus, type II ([facility] 11/05/2018   Morbid obesity ([facility] 11/05/2018   Contracture of tendon sheath 12/26/2014   Sinusitis 11/24/2014   Routine general medical examination at a health care facility 08/23/2012   Diabetes type 2, uncontrolled 02/27/2008   Hyperlipidemia 02/27/2008   Primary hypertension 02/27/2008   Past Medical History:  Diagnosis Date   Diabetes mellitus    Hyperlipidemia    Hypertension    Past Surgical History:  Procedure Laterality Date   ABDOMINAL HYSTERECTOMY     BREAST BIOPSY Left 09/19/2013    Fibrocystic changes and fibroadenomatoid changes    BREAST BIOPSY Right 09/11/2020   fibroadenoma with calcs   OOPHORECTOMY     Social History   Tobacco Use   Smoking status: Never   Smokeless tobacco: Never  Vaping Use   Vaping Use: Never used  Substance Use Topics   Alcohol use: No    Alcohol/week: 0.0 standard drinks of alcohol   Drug use: No   Social History   Socioeconomic History   Marital status: Married    Spouse name: JAerionna Moravek  Number of children: 1   Years of education: Not on file   Highest education level: Not on file  Occupational History   Occupation: lTherapist, music Tobacco Use   Smoking status: Never   Smokeless tobacco: Never  Vaping Use   Vaping Use: Never used  Substance and Sexual Activity   Alcohol use: No    Alcohol/week: 0.0 standard drinks of alcohol   Drug use: No   Sexual activity: Yes    Partners: Male  Other Topics Concern  Not on file  Social History Narrative   Exercise --  4-5 days a week-- walking    Social Determinants of Health   Financial Resource Strain: Not on file  Food Insecurity: Not on file  Transportation Needs: Not on file  Physical Activity: Not on file  Stress: Not on file  Social Connections: Not on file  Intimate Partner Violence: Not on file   Family Status  Relation Name Status    Father  (Not Specified)   Neg Hx  (Not Specified)   Family History  Problem Relation Age of Onset   Diabetes Father    High blood pressure Father    Obesity Father    Colon cancer Neg Hx    Colon polyps Neg Hx    No Known Allergies    ROS    Objective:     BP (!) 140/84 (BP Location: Left Arm, Patient Position: Sitting, Cuff Size: Normal)   Pulse 62   Temp 97.8 F (36.6 C) (Oral)   Resp 18   Ht '5\' 8"'$  (1.727 m)   Wt 202 lb 3.2 oz (91.7 kg)   SpO2 99%   BMI 30.74 kg/m  BP Readings from Last 3 Encounters:  06/23/22 (!) 140/84  01/14/22 118/70  09/28/21 (!) 160/80   Wt Readings from Last 3 Encounters:  06/23/22 202 lb 3.2 oz (91.7 kg)  01/14/22 215 lb (97.5 kg)  09/28/21 216 lb (98 kg)   SpO2 Readings from Last 3 Encounters:  06/23/22 99%  01/14/22 98%  09/28/21 98%      Physical Exam   No results found for any visits on 06/23/22.  Last CBC Lab Results  Component Value Date   WBC 4.9 01/14/2022   HGB 12.0 01/14/2022   HCT 37.3 01/14/2022   MCV 90.0 01/14/2022   MCH 29.2 09/13/2013   RDW 14.3 01/14/2022   PLT 238.0 56/21/3086   Last metabolic panel Lab Results  Component Value Date   GLUCOSE 132 (H) 01/14/2022   NA 140 01/14/2022   K 4.3 01/14/2022   CL 103 01/14/2022   CO2 31 01/14/2022   BUN 23 01/14/2022   CREATININE 1.05 01/14/2022   GFRNONAA 57 (L) 08/23/2018   CALCIUM 9.4 01/14/2022   PROT 6.4 01/14/2022   ALBUMIN 4.0 01/14/2022   LABGLOB 2.5 08/23/2018   AGRATIO 1.8 08/23/2018   BILITOT 0.5 01/14/2022   ALKPHOS 59 01/14/2022   AST 13 01/14/2022   ALT 14 01/14/2022   Last lipids Lab Results  Component Value Date   CHOL 128 01/14/2022   HDL 49.50 01/14/2022   LDLCALC 52 01/14/2022   LDLDIRECT 103.6 08/23/2012   TRIG 128.0 01/14/2022   CHOLHDL 3 01/14/2022   Last hemoglobin A1c Lab Results  Component Value Date   HGBA1C 8.0 (H) 01/14/2022   Last thyroid functions Lab Results  Component Value Date   TSH 1.78  02/14/2020   T3TOTAL 87 03/27/2018   T4TOTAL 8.4 01/09/2007   Last vitamin D Lab Results  Component Value Date   VD25OH 58.43 01/14/2022   Last vitamin B12 and Folate Lab Results  Component Value Date   VHQIONGE95 284 03/27/2018   FOLATE >20.0 03/27/2018      The ASCVD Risk score (Arnett DK, et al., 2019) failed to calculate for the following reasons:   The valid total cholesterol range is 130 to 320 mg/dL    Assessment & Plan:   Problem List Items Addressed This Visit  Unprioritized   Hyperlipidemia associated with type 2 diabetes mellitus ([place])   Relevant Medications   metFORMIN (GLUCOPHAGE) 1000 MG tablet   LANTUS SOLOSTAR 100 UNIT/ML Solostar Pen   Diabetes mellitus, type II ([facility])   Relevant Medications   metFORMIN (GLUCOPHAGE) 1000 MG tablet   LANTUS SOLOSTAR 100 UNIT/ML Solostar Pen   Primary hypertension    Well controlled, no changes to meds. Encouraged heart healthy diet such as the DASH diet and exercise as tolerated.       Other hyperlipidemia    Encourage heart healthy diet such as MIND or DASH diet, increase exercise, avoid trans fats, simple carbohydrates and processed foods, consider a krill or fish or flaxseed oil cap daily.       Other Visit Diagnoses     Epidermoid cyst    -  Primary   Relevant Orders   Ambulatory referral to Dermatology   Vitamin D deficiency       Relevant Medications   Vitamin D, Ergocalciferol, (DRISDOL) 1.25 MG (50000 UNIT) CAPS capsule   Nevus       Relevant Orders   Ambulatory referral to Dermatology       Return in about 6 months (around 12/24/2022) for annual exam, fasting.    Ann Held, DO

## 2022-06-23 NOTE — Patient Instructions (Signed)

## 2022-06-23 NOTE — Assessment & Plan Note (Signed)
Encourage heart healthy diet such as MIND or DASH diet, increase exercise, avoid trans fats, simple carbohydrates and processed foods, consider a krill or fish or flaxseed oil cap daily.  °

## 2022-06-23 NOTE — Assessment & Plan Note (Signed)
hgba1c to be checked, minimize simple carbs. Increase exercise as tolerated. Continue current meds  

## 2022-06-23 NOTE — Assessment & Plan Note (Signed)
Well controlled, no changes to meds. Encouraged heart healthy diet such as the DASH diet and exercise as tolerated.  °

## 2022-07-11 ENCOUNTER — Ambulatory Visit
Admission: EM | Admit: 2022-07-11 | Discharge: 2022-07-11 | Disposition: A | Discharge status: Home / Self Care | Payer: PRIVATE HEALTH INSURANCE | Attending: Emergency Medicine | Admitting: Emergency Medicine

## 2022-07-11 DIAGNOSIS — B9789 Other viral agents as the cause of diseases classified elsewhere: Secondary | ICD-10-CM | POA: Diagnosis present

## 2022-07-11 DIAGNOSIS — Z20822 Contact with and (suspected) exposure to covid-19: Secondary | ICD-10-CM | POA: Diagnosis present

## 2022-07-11 DIAGNOSIS — J988 Other specified respiratory disorders: Secondary | ICD-10-CM | POA: Insufficient documentation

## 2022-07-11 LAB — RESP PANEL BY RT-PCR (FLU A&B, COVID) ARPGX2
Influenza A by PCR: NEGATIVE
Influenza B by PCR: NEGATIVE
SARS Coronavirus 2 by RT PCR: NEGATIVE

## 2022-07-11 MED ORDER — FLUTICASONE PROPIONATE 50 MCG/ACT NA SUSP: 2.0000 | Freq: Every day | NASAL | 0 refills | Status: DC

## 2022-07-11 MED ORDER — IBUPROFEN 600 MG PO TABS: 600.0000 mg | ORAL_TABLET | Freq: Four times a day (QID) | ORAL | 0 refills | Status: DC | PRN

## 2022-07-11 NOTE — ED Triage Notes (Signed)
Pt. States since saturday night she has been experiencing nasal congestion, sore throat and a non-productive Cough.

## 2022-07-11 NOTE — ED Provider Notes (Signed)
HPI  SUBJECTIVE:  Kathleen Acevedo is a 61 y.o. female who presents with the acute onset of malaise, body aches, headaches, nasal congestion, rhinorrhea, scratchy throat, cough starting at 1800 3 days ago.  No fevers, postnasal drip, sinus pain or pressure, sore throat, loss of sense of smell or taste, wheezing, shortness of breath, nausea, vomiting, diarrhea, abdominal pain.  No known COVID or flu exposure.  She got 2 doses of the COVID-vaccine.  She is sleeping okay at night without waking up coughing.  She has been taking ibuprofen 400 mg, Mucinex nighttime and using a warm cloth on her face.  The ibuprofen and warm cloth helps.  Last dose of ibuprofen was within 6 hours of evaluation.  No aggravating factors.  She has a past medical history of diabetes, hypertension.  She has never had COVID.  PCP: [facility]    Past Medical History:  Diagnosis Date   Diabetes mellitus    Hyperlipidemia    Hypertension     Past Surgical History:  Procedure Laterality Date   ABDOMINAL HYSTERECTOMY     BREAST BIOPSY Left 09/19/2013    Fibrocystic changes and fibroadenomatoid changes    BREAST BIOPSY Right 09/11/2020   fibroadenoma with calcs   OOPHORECTOMY      Family History  Problem Relation Age of Onset   Diabetes Father    High blood pressure Father    Obesity Father    Colon cancer Neg Hx    Colon polyps Neg Hx     Social History   Tobacco Use   Smoking status: Never   Smokeless tobacco: Never  Vaping Use   Vaping Use: Never used  Substance Use Topics   Alcohol use: No    Alcohol/week: 0.0 standard drinks of alcohol   Drug use: No    No current facility-administered medications for this encounter.  Current Outpatient Medications:    fluticasone (FLONASE) 50 MCG/ACT nasal spray, Place 2 sprays into both nostrils daily., Disp: 16 g, Rfl: 0   ibuprofen (ADVIL) 600 MG tablet, Take 1 tablet (600 mg total) by mouth every 6 (six) hours as needed., Disp: 30 tablet, Rfl: 0    empagliflozin (JARDIANCE) 25 MG TABS tablet, Take 1 tablet (25 mg total) by mouth daily., Disp: 90 tablet, Rfl: 1   ferrous sulfate 325 (65 FE) MG tablet, Take 1 tablet (325 mg total) by mouth 2 (two) times daily., Disp: , Rfl:    LANTUS SOLOSTAR 100 UNIT/ML Solostar Pen, INJECT 15 UNITS SUBCUTANEOUSLY ONCE DAILY, Disp: 15 mL, Rfl: 6   lisinopril-hydrochlorothiazide (ZESTORETIC) 10-12.5 MG tablet, Take 1 tablet by mouth daily., Disp: 90 tablet, Rfl: 1   metFORMIN (GLUCOPHAGE) 1000 MG tablet, Take 1 tablet (1,000 mg total) by mouth 2 (two) times daily with a meal., Disp: 180 tablet, Rfl: 1   rosuvastatin (CRESTOR) 5 MG tablet, Take 1 tablet (5 mg total) by mouth daily., Disp: 90 tablet, Rfl: 1   RYBELSUS 14 MG TABS, Take 1 tablet by mouth once daily, Disp: 30 tablet, Rfl: 5   Vitamin D, Ergocalciferol, (DRISDOL) 1.25 MG (50000 UNIT) CAPS capsule, Take  One every other week, Disp: 12 capsule, Rfl: 2  No Known Allergies   ROS  As noted in HPI.   Physical Exam  BP (!) 141/80   Pulse 70   Temp 98.5 F (36.9 C)   Resp 16   SpO2 95%   Constitutional: Well developed, well nourished, no acute distress Eyes:  EOMI, conjunctiva normal  bilaterally HENT: Normocephalic, atraumatic,mucus membranes moist.  Positive nasal congestion.  No maxillary, frontal sinus tenderness.  Normal oropharynx.  Normal tonsils with exudates.  No obvious postnasal drip. Neck: Positive cervical lymphadenopathy Respiratory: Normal inspiratory effort, lungs clear bilaterally, no anterior, lateral chest wall tenderness Cardiovascular: Normal rate, regular rhythm, no murmurs, rubs, gallops GI: nondistended skin: No rash, skin intact Musculoskeletal: no deformities Neurologic: Alert & oriented x 3, no focal neuro deficits Psychiatric: Speech and behavior appropriate   ED Course   Medications - No data to display  Orders Placed This Encounter  Procedures   Resp Panel by RT-PCR (Flu A&B, Covid) Anterior Nasal Swab     Standing Status:   Standing    Number of Occurrences:   1    Results for orders placed or performed during the hospital encounter of 07/11/22 (from the past 24 hour(s))  Resp Panel by RT-PCR (Flu A&B, Covid) Anterior Nasal Swab     Status: None   Collection Time: 07/11/22  1:55 PM   Specimen: Anterior Nasal Swab  Result Value Ref Range   SARS Coronavirus 2 by RT PCR NEGATIVE NEGATIVE   Influenza A by PCR NEGATIVE NEGATIVE   Influenza B by PCR NEGATIVE NEGATIVE   No results found.  ED Clinical Impression  1. Viral respiratory illness   2. Encounter for laboratory testing for COVID-19 virus      ED Assessment/Plan      Will prescribe molnupiravir if COVID is positive, Tamiflu if influenza is positive. patient to call here and let us know lab results if positive.  In the meantime, Mucinex during the day, nighttime Mucinex at night, Flonase, saline nasal irrigation, Tylenol/ibuprofen, work note for 2 days.  She may return to work sooner if her COVID and flu are negative.  COVID, influenza negative.  Patient with viral illness with cough.  Supportive treatment as above.  Discussed labs, MDM, treatment plan, and plan for follow-up with patient. Discussed sn/sx that should prompt return to the ED. patient agrees with plan.   Meds ordered this encounter  Medications   fluticasone (FLONASE) 50 MCG/ACT nasal spray    Sig: Place 2 sprays into both nostrils daily.    Dispense:  16 g    Refill:  0   ibuprofen (ADVIL) 600 MG tablet    Sig: Take 1 tablet (600 mg total) by mouth every 6 (six) hours as needed.    Dispense:  30 tablet    Refill:  0      *This clinic note was created using [employer]. Therefore, there may be occasional mistakes despite careful proofreading.  ?    Melynda Ripple, MD 07/12/22 [phone]

## 2022-07-11 NOTE — Discharge Instructions (Addendum)
I will call in molnupiravir if your COVID is positive, and Tamiflu if your influenza is positive.  Call here ASAP if you get a positive result and let us now.  . Take 1 gram of tylenol with the 600 mg motrin up to 3-4 times a day as needed for pain and fever. This is an effective combination. Drink extra fluids. Start taking the mucinex during the day and the nighttime Mucinex at night to keep the mucus secretions thin. Use a NeilMed sinus rinse with distilled water as often as you want to to reduce nasal congestion. Follow the directions on the box.  Go to www.goodrx.com  or www.costplusdrugs.com to look up your medications. This will give you a list of where you can find your prescriptions at the most affordable prices. Or ask the pharmacist what the cash price is, or if they have any other discount programs available to help make your medication more affordable. This can be less expensive than what you would pay with insurance.

## 2022-07-19 ENCOUNTER — Ambulatory Visit: Payer: PRIVATE HEALTH INSURANCE | Admitting: Family Medicine

## 2022-08-16 ENCOUNTER — Other Ambulatory Visit: Payer: Self-pay | Admitting: Family Medicine

## 2022-08-16 DIAGNOSIS — I10 Essential (primary) hypertension: Secondary | ICD-10-CM

## 2022-08-17 ENCOUNTER — Telehealth: Payer: Self-pay | Admitting: Family Medicine

## 2022-08-17 DIAGNOSIS — I10 Essential (primary) hypertension: Secondary | ICD-10-CM

## 2022-08-17 DIAGNOSIS — E119 Type 2 diabetes mellitus without complications: Secondary | ICD-10-CM

## 2022-08-17 MED ORDER — METFORMIN HCL 1000 MG PO TABS: 1000.0000 mg | ORAL_TABLET | Freq: Two times a day (BID) | ORAL | 1 refills | Status: DC

## 2022-08-17 MED ORDER — LISINOPRIL-HYDROCHLOROTHIAZIDE 10-12.5 MG PO TABS: 1.0000 | ORAL_TABLET | Freq: Every day | ORAL | 1 refills | Status: DC

## 2022-08-17 NOTE — Telephone Encounter (Signed)
Medication: metFORMIN (GLUCOPHAGE) 1000 MG tablet  lisinopril-hydrochlorothiazide (ZESTORETIC) 10-12.5 MG tablet (pt has been out of this rx for a couple weeks)   Has the patient contacted their pharmacy? Yes.     Preferred Pharmacy:  [facility], [address], [place] [address] Phone: [phone]  Fax: [phone]

## 2022-08-17 NOTE — Telephone Encounter (Signed)
Rx sent. Pt has appt tomorrow

## 2022-08-18 ENCOUNTER — Ambulatory Visit (INDEPENDENT_AMBULATORY_CARE_PROVIDER_SITE_OTHER): Payer: PRIVATE HEALTH INSURANCE | Admitting: Family Medicine

## 2022-08-18 ENCOUNTER — Encounter: Payer: Self-pay | Admitting: Family Medicine

## 2022-08-18 VITALS — BP 130/78 | HR 66 | Temp 97.5°F | Resp 18 | Ht 68.0 in | Wt 202.0 lb

## 2022-08-18 DIAGNOSIS — I10 Essential (primary) hypertension: Secondary | ICD-10-CM

## 2022-08-18 DIAGNOSIS — Z0000 Encounter for general adult medical examination without abnormal findings: Secondary | ICD-10-CM | POA: Insufficient documentation

## 2022-08-18 DIAGNOSIS — E1165 Type 2 diabetes mellitus with hyperglycemia: Secondary | ICD-10-CM | POA: Diagnosis not present

## 2022-08-18 DIAGNOSIS — E119 Type 2 diabetes mellitus without complications: Secondary | ICD-10-CM

## 2022-08-18 DIAGNOSIS — E1169 Type 2 diabetes mellitus with other specified complication: Secondary | ICD-10-CM | POA: Diagnosis not present

## 2022-08-18 DIAGNOSIS — E559 Vitamin D deficiency, unspecified: Secondary | ICD-10-CM | POA: Diagnosis not present

## 2022-08-18 DIAGNOSIS — E785 Hyperlipidemia, unspecified: Secondary | ICD-10-CM

## 2022-08-18 LAB — COMPREHENSIVE METABOLIC PANEL
ALT: 11 U/L (ref 0–35)
AST: 13 U/L (ref 0–37)
Albumin: 3.9 g/dL (ref 3.5–5.2)
Alkaline Phosphatase: 54 U/L (ref 39–117)
BUN: 23 mg/dL (ref 6–23)
CO2: 28 mEq/L (ref 19–32)
Calcium: 9.4 mg/dL (ref 8.4–10.5)
Chloride: 105 mEq/L (ref 96–112)
Creatinine, Ser: 1.02 mg/dL (ref 0.40–1.20)
GFR: 58.73 mL/min — ABNORMAL LOW (ref >60.00)
Glucose, Bld: 125 mg/dL — ABNORMAL HIGH (ref 70–99)
Potassium: 4.2 mEq/L (ref 3.5–5.1)
Sodium: 141 mEq/L (ref 135–145)
Total Bilirubin: 0.3 mg/dL (ref 0.2–1.2)
Total Protein: 6.3 g/dL (ref 6.0–8.3)

## 2022-08-18 LAB — HEMOGLOBIN A1C: Hgb A1c MFr Bld: 7.7 % — ABNORMAL HIGH (ref 4.6–6.5)

## 2022-08-18 LAB — CBC WITH DIFFERENTIAL/PLATELET
Basophils Absolute: 0 10*3/uL (ref 0.0–0.1)
Basophils Relative: 0.6 % (ref 0.0–3.0)
Eosinophils Absolute: 0.1 10*3/uL (ref 0.0–0.7)
Eosinophils Relative: 3.1 % (ref 0.0–5.0)
HCT: 35.8 % — ABNORMAL LOW (ref 36.0–46.0)
Hemoglobin: 11.5 g/dL — ABNORMAL LOW (ref 12.0–15.0)
Lymphocytes Relative: 34.6 % (ref 12.0–46.0)
Lymphs Abs: 1.6 10*3/uL (ref 0.7–4.0)
MCHC: 32.1 g/dL (ref 30.0–36.0)
MCV: 90.8 fl (ref 78.0–100.0)
Monocytes Absolute: 0.4 10*3/uL (ref 0.1–1.0)
Monocytes Relative: 8.8 % (ref 3.0–12.0)
Neutro Abs: 2.4 10*3/uL (ref 1.4–7.7)
Neutrophils Relative %: 52.9 % (ref 43.0–77.0)
Platelets: 246 10*3/uL (ref 150.0–400.0)
RBC: 3.94 Mil/uL (ref 3.87–5.11)
RDW: 14.8 % (ref 11.5–15.5)
WBC: 4.6 10*3/uL (ref 4.0–10.5)

## 2022-08-18 LAB — MICROALBUMIN / CREATININE URINE RATIO
Creatinine,U: 75 mg/dL
Microalb Creat Ratio: 0.9 mg/g (ref 0.0–30.0)
Microalb, Ur: <0.7 mg/dL (ref 0.0–1.9)

## 2022-08-18 LAB — LIPID PANEL
Cholesterol: 138 mg/dL (ref 0–200)
HDL: 49.6 mg/dL (ref >39.00)
LDL Cholesterol: 61 mg/dL (ref 0–99)
NonHDL: 88.27
Total CHOL/HDL Ratio: 3
Triglycerides: 136 mg/dL (ref 0.0–149.0)
VLDL: 27.2 mg/dL (ref 0.0–40.0)

## 2022-08-18 LAB — VITAMIN D 25 HYDROXY (VIT D DEFICIENCY, FRACTURES): VITD: 48.52 ng/mL (ref 30.00–100.00)

## 2022-08-18 LAB — TSH: TSH: 1.29 u[IU]/mL (ref 0.35–5.50)

## 2022-08-18 NOTE — Assessment & Plan Note (Signed)
hgba1c to be checked, minimize simple carbs. Increase exercise as tolerated. Continue current meds  

## 2022-08-18 NOTE — Assessment & Plan Note (Signed)
Encourage heart healthy diet such as MIND or DASH diet, increase exercise, avoid trans fats, simple carbohydrates and processed foods, consider a krill or fish or flaxseed oil cap daily.  °

## 2022-08-18 NOTE — Assessment & Plan Note (Signed)
Well controlled, no changes to meds. Encouraged heart healthy diet such as the DASH diet and exercise as tolerated.  °

## 2022-08-18 NOTE — Patient Instructions (Signed)
Preventive Care 40-64 Years Old, Female Preventive care refers to lifestyle choices and visits with your health care provider that can promote health and wellness. Preventive care visits are also called wellness exams. What can I expect for my preventive care visit? Counseling Your health care provider may ask you questions about your: Medical history, including: Past medical problems. Family medical history. Pregnancy history. Current health, including: Menstrual cycle. Method of birth control. Emotional well-being. Home life and relationship well-being. Sexual activity and sexual health. Lifestyle, including: Alcohol, nicotine or tobacco, and drug use. Access to firearms. Diet, exercise, and sleep habits. Work and work environment. Sunscreen use. Safety issues such as seatbelt and bike helmet use. Physical exam Your health care provider will check your: Height and weight. These may be used to calculate your BMI (body mass index). BMI is a measurement that tells if you are at a healthy weight. Waist circumference. This measures the distance around your waistline. This measurement also tells if you are at a healthy weight and may help predict your risk of certain diseases, such as type 2 diabetes and high blood pressure. Heart rate and blood pressure. Body temperature. Skin for abnormal spots. What immunizations do I need?  Vaccines are usually given at various ages, according to a schedule. Your health care provider will recommend vaccines for you based on your age, medical history, and lifestyle or other factors, such as travel or where you work. What tests do I need? Screening Your health care provider may recommend screening tests for certain conditions. This may include: Lipid and cholesterol levels. Diabetes screening. This is done by checking your blood sugar (glucose) after you have not eaten for a while (fasting). Pelvic exam and Pap test. Hepatitis B test. Hepatitis C  test. HIV (human immunodeficiency virus) test. STI (sexually transmitted infection) testing, if you are at risk. Lung cancer screening. Colorectal cancer screening. Mammogram. Talk with your health care provider about when you should start having regular mammograms. This may depend on whether you have a family history of breast cancer. BRCA-related cancer screening. This may be done if you have a family history of breast, ovarian, tubal, or peritoneal cancers. Bone density scan. This is done to screen for osteoporosis. Talk with your health care provider about your test results, treatment options, and if necessary, the need for more tests. Follow these instructions at home: Eating and drinking  Eat a diet that includes fresh fruits and vegetables, whole grains, lean protein, and low-fat dairy products. Take vitamin and mineral supplements as recommended by your health care provider. Do not drink alcohol if: Your health care provider tells you not to drink. You are pregnant, may be pregnant, or are planning to become pregnant. If you drink alcohol: Limit how much you have to 0-1 drink a day. Know how much alcohol is in your drink. In the [place] one drink equals one 12 oz bottle of beer (355 mL), one 5 oz glass of wine (148 mL), or one 1 oz glass of hard liquor (44 mL). Lifestyle Brush your teeth every morning and night with fluoride toothpaste. Floss one time each day. Exercise for at least 30 minutes 5 or more days each week. Do not use any products that contain nicotine or tobacco. These products include cigarettes, chewing tobacco, and vaping devices, such as e-cigarettes. If you need help quitting, ask your health care provider. Do not use drugs. If you are sexually active, practice safe sex. Use a condom or other form of protection to   prevent STIs. If you do not wish to become pregnant, use a form of birth control. If you plan to become pregnant, see your health care provider for a  prepregnancy visit. Take aspirin only as told by your health care provider. Make sure that you understand how much to take and what form to take. Work with your health care provider to find out whether it is safe and beneficial for you to take aspirin daily. Find healthy ways to manage stress, such as: Meditation, yoga, or listening to music. Journaling. Talking to a trusted person. Spending time with friends and family. Minimize exposure to UV radiation to reduce your risk of skin cancer. Safety Always wear your seat belt while driving or riding in a vehicle. Do not drive: If you have been drinking alcohol. Do not ride with someone who has been drinking. When you are tired or distracted. While texting. If you have been using any mind-altering substances or drugs. Wear a helmet and other protective equipment during sports activities. If you have firearms in your house, make sure you follow all gun safety procedures. Seek help if you have been physically or sexually abused. What's next? Visit your health care provider once a year for an annual wellness visit. Ask your health care provider how often you should have your eyes and teeth checked. Stay up to date on all vaccines. This information is not intended to replace advice given to you by your health care provider. Make sure you discuss any questions you have with your health care provider. Document Revised: 04/14/2021 Document Reviewed: 04/14/2021 Elsevier Patient Education  [place].

## 2022-08-18 NOTE — Assessment & Plan Note (Signed)
ghm utd Check labs  See avs  

## 2022-08-18 NOTE — Progress Notes (Signed)
Subjective:   By signing my name below, I, Shehryar Baig, attest that this documentation has been prepared under the direction and in the presence of Ann Held, DO. 08/18/2022     Patient ID: Kathleen Acevedo, female    DOB: [birth year], 62 y.o.   MRN: [id number]  Chief Complaint  Patient presents with   Annual Exam    Pt states not fasting     HPI Patient is in today for a comprehensive physical exam.   She does not follow up with a GYN specialist regularly.  She is not following up with her endocrinologist regularly. She reports her blood sugars measured 113 this morning.  Lab Results  Component Value Date   HGBA1C 7.4 (H) 06/23/2022   She denies having any fever, new muscle pain, new joint pain, new moles, congestion, sinus pain, sore throat, chest pain, palpations, cough, SOB, wheezing, n/v/d, constipation, blood in stool, dysuria, frequency, hematuria, or headaches at this time.  She has no changes to her family medical history. She has no recent surgical procedures to report.  She participates in regular exercise by walking daily and seeing a fitness trainer for yoga and weight lifting once a week.  She is UTD on vision care. She is UTD on dental care.    Past Medical History:  Diagnosis Date   Diabetes mellitus    Hyperlipidemia    Hypertension     Past Surgical History:  Procedure Laterality Date   ABDOMINAL HYSTERECTOMY     BREAST BIOPSY Left 09/19/2013    Fibrocystic changes and fibroadenomatoid changes    BREAST BIOPSY Right 09/11/2020   fibroadenoma with calcs   OOPHORECTOMY      Family History  Problem Relation Age of Onset   Diabetes Father    High blood pressure Father    Obesity Father    Colon cancer Neg Hx    Colon polyps Neg Hx     Social History   Socioeconomic History   Marital status: Married    Spouse name: Shaneca Orne   Number of children: 1   Years of education: Not on file   Highest education level: Not on file   Occupational History   Occupation: Therapist, music  Tobacco Use   Smoking status: Never   Smokeless tobacco: Never  Vaping Use   Vaping Use: Never used  Substance and Sexual Activity   Alcohol use: No    Alcohol/week: 0.0 standard drinks of alcohol   Drug use: No   Sexual activity: Yes    Partners: Male  Other Topics Concern   Not on file  Social History Narrative   Exercise --  4-5 days a week-- walking    Social Determinants of Health   Financial Resource Strain: Not on file  Food Insecurity: Not on file  Transportation Needs: Not on file  Physical Activity: Not on file  Stress: Not on file  Social Connections: Not on file  Intimate Partner Violence: Not on file    Outpatient Medications Prior to Visit  Medication Sig Dispense Refill   empagliflozin (JARDIANCE) 25 MG TABS tablet Take 1 tablet (25 mg total) by mouth daily. 90 tablet 1   ferrous sulfate 325 (65 FE) MG tablet Take 1 tablet (325 mg total) by mouth 2 (two) times daily.     fluticasone (FLONASE) 50 MCG/ACT nasal spray Place 2 sprays into both nostrils daily. 16 g 0   ibuprofen (ADVIL) 600 MG tablet Take 1 tablet (600  mg total) by mouth every 6 (six) hours as needed. 30 tablet 0   LANTUS SOLOSTAR 100 UNIT/ML Solostar Pen INJECT 15 UNITS SUBCUTANEOUSLY ONCE DAILY 15 mL 6   lisinopril-hydrochlorothiazide (ZESTORETIC) 10-12.5 MG tablet Take 1 tablet by mouth daily. 90 tablet 1   metFORMIN (GLUCOPHAGE) 1000 MG tablet Take 1 tablet (1,000 mg total) by mouth 2 (two) times daily with a meal. 180 tablet 1   rosuvastatin (CRESTOR) 5 MG tablet Take 1 tablet (5 mg total) by mouth daily. 90 tablet 1   RYBELSUS 14 MG TABS Take 1 tablet by mouth once daily 30 tablet 5   Vitamin D, Ergocalciferol, (DRISDOL) 1.25 MG (50000 UNIT) CAPS capsule Take  One every other week 12 capsule 2   No facility-administered medications prior to visit.    No Known Allergies  Review of Systems  Constitutional:  Negative for fever and  malaise/fatigue.  HENT:  Negative for congestion, sinus pain and sore throat.   Eyes:  Negative for blurred vision.  Respiratory:  Negative for cough, shortness of breath and wheezing.   Cardiovascular:  Negative for chest pain, palpitations and leg swelling.  Gastrointestinal:  Negative for abdominal pain, blood in stool, constipation, diarrhea, nausea and vomiting.  Genitourinary:  Negative for dysuria, frequency and hematuria.  Musculoskeletal:  Negative for falls.       (-)new muscle pain (-)new joint pain  Skin:  Negative for rash.       (-)New moles  Neurological:  Negative for dizziness, loss of consciousness and headaches.  Endo/Heme/Allergies:  Negative for environmental allergies.  Psychiatric/Behavioral:  Negative for depression. The patient is not nervous/anxious.        Objective:    Physical Exam Vitals and nursing note reviewed.  Constitutional:      General: She is not in acute distress.    Appearance: Normal appearance. She is not ill-appearing.  HENT:     Head: Normocephalic and atraumatic.     Right Ear: Tympanic membrane, ear canal and external ear normal.     Left Ear: Tympanic membrane, ear canal and external ear normal.  Eyes:     Extraocular Movements: Extraocular movements intact.     Pupils: Pupils are equal, round, and reactive to light.  Cardiovascular:     Rate and Rhythm: Normal rate and regular rhythm.     Heart sounds: Normal heart sounds. No murmur heard.    No gallop.  Pulmonary:     Effort: Pulmonary effort is normal. No respiratory distress.     Breath sounds: Normal breath sounds. No wheezing or rales.  Abdominal:     General: Bowel sounds are normal. There is no distension.     Palpations: Abdomen is soft.     Tenderness: There is no abdominal tenderness. There is no guarding.  Skin:    General: Skin is warm and dry.  Neurological:     Mental Status: She is alert and oriented to person, place, and time.  Psychiatric:         Judgment: Judgment normal.     BP 130/78 (BP Location: Right Arm, Patient Position: Sitting, Cuff Size: Normal)   Pulse 66   Temp (!) 97.5 F (36.4 C) (Oral)   Resp 18   Ht '5\' 8"'$  (1.727 m)   Wt 202 lb (91.6 kg)   SpO2 97%   BMI 30.71 kg/m  Wt Readings from Last 3 Encounters:  08/18/22 202 lb (91.6 kg)  06/23/22 202 lb 3.2 oz (91.7 kg)  01/14/22 215 lb (97.5 kg)    Diabetic Foot Exam - Simple   No data filed    Lab Results  Component Value Date   WBC 4.9 01/14/2022   HGB 12.0 01/14/2022   HCT 37.3 01/14/2022   PLT 238.0 01/14/2022   GLUCOSE 96 06/23/2022   CHOL 131 06/23/2022   TRIG 117.0 06/23/2022   HDL 46.50 06/23/2022   LDLDIRECT 103.6 08/23/2012   LDLCALC 61 06/23/2022   ALT 10 06/23/2022   AST 11 06/23/2022   NA 141 06/23/2022   K 4.4 06/23/2022   CL 105 06/23/2022   CREATININE 1.07 06/23/2022   BUN 25 (H) 06/23/2022   CO2 29 06/23/2022   TSH 1.78 02/14/2020   HGBA1C 7.4 (H) 06/23/2022   MICROALBUR <0.7 01/14/2022    Lab Results  Component Value Date   TSH 1.78 02/14/2020   Lab Results  Component Value Date   WBC 4.9 01/14/2022   HGB 12.0 01/14/2022   HCT 37.3 01/14/2022   MCV 90.0 01/14/2022   PLT 238.0 01/14/2022   Lab Results  Component Value Date   NA 141 06/23/2022   K 4.4 06/23/2022   CO2 29 06/23/2022   GLUCOSE 96 06/23/2022   BUN 25 (H) 06/23/2022   CREATININE 1.07 06/23/2022   BILITOT 0.4 06/23/2022   ALKPHOS 48 06/23/2022   AST 11 06/23/2022   ALT 10 06/23/2022   PROT 6.3 06/23/2022   ALBUMIN 3.8 06/23/2022   CALCIUM 9.2 06/23/2022   GFR 55.51 (L) 06/23/2022   Lab Results  Component Value Date   CHOL 131 06/23/2022   Lab Results  Component Value Date   HDL 46.50 06/23/2022   Lab Results  Component Value Date   LDLCALC 61 06/23/2022   Lab Results  Component Value Date   TRIG 117.0 06/23/2022   Lab Results  Component Value Date   CHOLHDL 3 06/23/2022   Lab Results  Component Value Date   HGBA1C 7.4 (H)  06/23/2022   Mammogram- Last completed 09/22/2021. Results are normal. Repeat in 1 year.  Dexa- Last completed 06/15/2009.  Colonoscopy- Last completed 04/19/2013. Results showed mild diverticulosis in ascending colon, otherwise results are normal. Repeat in 10 years.      Assessment & Plan:   Problem List Items Addressed This Visit       Unprioritized   Diabetes mellitus, type II ([place])   Relevant Orders   CBC with Differential/Platelet   Comprehensive metabolic panel   Lipid panel   Hemoglobin A1c   TSH   Microalbumin / creatinine urine ratio   Primary hypertension    Well controlled, no changes to meds. Encouraged heart healthy diet such as the DASH diet and exercise as tolerated.        Preventative health care - Primary    ghm utd Check labs  See avs       Relevant Orders   CBC with Differential/Platelet   Comprehensive metabolic panel   Lipid panel   Hemoglobin A1c   TSH   Microalbumin / creatinine urine ratio   VITAMIN D 25 Hydroxy (Vit-D Deficiency, Fractures)   Hyperlipidemia associated with type 2 diabetes mellitus (HCC)    Encourage heart healthy diet such as MIND or DASH diet, increase exercise, avoid trans fats, simple carbohydrates and processed foods, consider a krill or fish or flaxseed oil cap daily.       Relevant Orders   CBC with Differential/Platelet   Comprehensive metabolic panel   Lipid panel  Hemoglobin A1c   TSH   Microalbumin / creatinine urine ratio   Hyperlipidemia    Encourage heart healthy diet such as MIND or DASH diet, increase exercise, avoid trans fats, simple carbohydrates and processed foods, consider a krill or fish or flaxseed oil cap daily.       Other Visit Diagnoses     Essential hypertension       Relevant Orders   CBC with Differential/Platelet   Comprehensive metabolic panel   Lipid panel   Hemoglobin A1c   TSH   Microalbumin / creatinine urine ratio   Vitamin D deficiency       Relevant Orders   VITAMIN  D 25 Hydroxy (Vit-D Deficiency, Fractures)        No orders of the defined types were placed in this encounter.   IAnn Held, DO, personally preformed the services described in this documentation.  All medical record entries made by the scribe were at my direction and in my presence.  I have reviewed the chart and discharge instructions (if applicable) and agree that the record reflects my personal performance and is accurate and complete. 08/18/2022   I,Shehryar Baig,acting as a scribe for Ann Held, DO.,have documented all relevant documentation on the behalf of Ann Held, DO,as directed by  Ann Held, DO while in the presence of Ann Held, DO.   Ann Held, DO

## 2022-08-19 ENCOUNTER — Telehealth: Payer: Self-pay

## 2022-08-19 ENCOUNTER — Encounter: Payer: PRIVATE HEALTH INSURANCE | Admitting: Family Medicine

## 2022-08-19 NOTE — Telephone Encounter (Signed)
PA initiated via Covermymeds; KEY: [id number]. PA approved.   PA Case: [id number], Status: Approved, Coverage Starts on: 08/19/2022 12:00:00 AM, Coverage Ends on: 08/19/2023 12:00:00 AM

## 2022-08-22 ENCOUNTER — Telehealth: Payer: Self-pay

## 2022-08-22 NOTE — Telephone Encounter (Signed)
Wellness form completed and faxed to [phone]

## 2022-08-25 NOTE — Progress Notes (Signed)
A1c creeping up---- watch simple sugars and starches  Recheck in 5 months

## 2022-09-21 ENCOUNTER — Other Ambulatory Visit: Payer: Self-pay | Admitting: Family Medicine

## 2022-09-21 DIAGNOSIS — E119 Type 2 diabetes mellitus without complications: Secondary | ICD-10-CM

## 2022-10-04 ENCOUNTER — Other Ambulatory Visit: Payer: Self-pay | Admitting: Family Medicine

## 2022-10-04 DIAGNOSIS — Z1231 Encounter for screening mammogram for malignant neoplasm of breast: Secondary | ICD-10-CM

## 2022-10-24 ENCOUNTER — Other Ambulatory Visit: Payer: Self-pay | Admitting: Family Medicine

## 2022-10-31 ENCOUNTER — Encounter: Payer: Self-pay | Admitting: Family Medicine

## 2022-10-31 DIAGNOSIS — L02429 Furuncle of limb, unspecified: Secondary | ICD-10-CM

## 2022-11-01 ENCOUNTER — Other Ambulatory Visit: Payer: Self-pay | Admitting: Family Medicine

## 2022-11-01 DIAGNOSIS — L0211 Cutaneous abscess of neck: Secondary | ICD-10-CM

## 2022-11-02 MED ORDER — DOXYCYCLINE HYCLATE 100 MG PO TABS: 100.0000 mg | ORAL_TABLET | Freq: Two times a day (BID) | ORAL | 0 refills | Status: DC

## 2022-11-02 NOTE — Telephone Encounter (Signed)
Doxycycline sent in Will need office visit if it does not improve

## 2022-11-28 ENCOUNTER — Other Ambulatory Visit: Payer: Self-pay | Admitting: Family Medicine

## 2022-11-28 DIAGNOSIS — E1169 Type 2 diabetes mellitus with other specified complication: Secondary | ICD-10-CM

## 2022-11-28 DIAGNOSIS — L02429 Furuncle of limb, unspecified: Secondary | ICD-10-CM

## 2022-11-30 ENCOUNTER — Ambulatory Visit
Admission: RE | Admit: 2022-11-30 | Discharge: 2022-11-30 | Disposition: A | Discharge status: Home / Self Care | Payer: PRIVATE HEALTH INSURANCE | Source: Ambulatory Visit | Attending: Family Medicine | Admitting: Family Medicine

## 2022-11-30 DIAGNOSIS — Z1231 Encounter for screening mammogram for malignant neoplasm of breast: Secondary | ICD-10-CM

## 2023-01-06 ENCOUNTER — Encounter: Payer: PRIVATE HEALTH INSURANCE | Admitting: Family Medicine

## 2023-01-17 ENCOUNTER — Other Ambulatory Visit: Payer: Self-pay | Admitting: Family Medicine

## 2023-01-17 DIAGNOSIS — I10 Essential (primary) hypertension: Secondary | ICD-10-CM

## 2023-01-18 ENCOUNTER — Other Ambulatory Visit: Payer: Self-pay | Admitting: Family Medicine

## 2023-02-03 LAB — HM DIABETES EYE EXAM

## 2023-02-15 ENCOUNTER — Other Ambulatory Visit: Payer: Self-pay | Admitting: Family Medicine

## 2023-02-15 DIAGNOSIS — E1169 Type 2 diabetes mellitus with other specified complication: Secondary | ICD-10-CM

## 2023-02-27 ENCOUNTER — Telehealth: Payer: PRIVATE HEALTH INSURANCE

## 2023-02-27 ENCOUNTER — Ambulatory Visit: Payer: PRIVATE HEALTH INSURANCE | Admitting: Family Medicine

## 2023-02-27 VITALS — BP 138/72 | HR 65 | Temp 98.1°F | Resp 18 | Ht 68.0 in | Wt 206.2 lb

## 2023-02-27 DIAGNOSIS — I10 Essential (primary) hypertension: Secondary | ICD-10-CM

## 2023-02-27 DIAGNOSIS — E1169 Type 2 diabetes mellitus with other specified complication: Secondary | ICD-10-CM | POA: Diagnosis not present

## 2023-02-27 DIAGNOSIS — L0291 Cutaneous abscess, unspecified: Secondary | ICD-10-CM

## 2023-02-27 DIAGNOSIS — E119 Type 2 diabetes mellitus without complications: Secondary | ICD-10-CM

## 2023-02-27 DIAGNOSIS — E785 Hyperlipidemia, unspecified: Secondary | ICD-10-CM | POA: Diagnosis not present

## 2023-02-27 MED ORDER — METFORMIN HCL 1000 MG PO TABS: 1000.0000 mg | ORAL_TABLET | Freq: Two times a day (BID) | ORAL | 1 refills | Status: DC

## 2023-02-27 MED ORDER — EMPAGLIFLOZIN 25 MG PO TABS: 25.0000 mg | ORAL_TABLET | Freq: Every day | ORAL | 1 refills | Status: DC

## 2023-02-27 MED ORDER — DOXYCYCLINE HYCLATE 100 MG PO TABS: 100.0000 mg | ORAL_TABLET | Freq: Two times a day (BID) | ORAL | 0 refills | Status: DC

## 2023-02-27 NOTE — Progress Notes (Signed)
Subjective:   By signing my name below, I, Isabelle Course, attest that this documentation has been prepared under the direction and in the presence of Donato Schultz, DO 02/27/23   Patient ID: Kathleen Acevedo, female    DOB: [birth year], 62 y.o.   MRN: [id number]  Chief Complaint  Patient presents with   Recurrent Skin Infections    Right under arm, pt states boil is not closing and uncomfortable. No discharge    HPI Patient is in today for a follow up. She is overall doing well   She has been monitoring her blood sugar about 2-3 times a day. She reports her readings are ok in the morning and mid day, but she tends to see a bit of a spike in the evenings. She states it eventually resolves.   She complains of a recurrent boil in her right under arm. She boil recently popped and she states it has not closed and is uncomfortable and painful if she does not have a bandage. After the one boil popped, she states it turned into multiple small boils. She endorses drainage. The first time this occurred, she endorses redness and states it did not fully close.  She is UTD on routine vision care. She follows up with Dr. Hazle Quant.   Past Medical History:  Diagnosis Date   Diabetes mellitus    Hyperlipidemia    Hypertension     Past Surgical History:  Procedure Laterality Date   ABDOMINAL HYSTERECTOMY     BREAST BIOPSY Left 09/19/2013    Fibrocystic changes and fibroadenomatoid changes    BREAST BIOPSY Right 09/11/2020   fibroadenoma with calcs   OOPHORECTOMY      Family History  Problem Relation Age of Onset   Diabetes Father    High blood pressure Father    Obesity Father    Colon cancer Neg Hx    Colon polyps Neg Hx     Social History   Socioeconomic History   Marital status: Married    Spouse name: Melony Tenpas   Number of children: 1   Years of education: Not on file   Highest education level: Some college, no degree  Occupational History   Occupation: Occupational hygienist   Tobacco Use   Smoking status: Never   Smokeless tobacco: Never  Vaping Use   Vaping Use: Never used  Substance and Sexual Activity   Alcohol use: No    Alcohol/week: 0.0 standard drinks of alcohol   Drug use: No   Sexual activity: Yes    Partners: Male  Other Topics Concern   Not on file  Social History Narrative   Exercise --  4-5 days a week-- walking    Social Determinants of Health   Financial Resource Strain: Low Risk  (02/27/2023)   Overall Financial Resource Strain (CARDIA)    Difficulty of Paying Living Expenses: Not hard at all  Food Insecurity: No Food Insecurity (02/27/2023)   Hunger Vital Sign    Worried About Running Out of Food in the Last Year: Never true    Ran Out of Food in the Last Year: Never true  Transportation Needs: No Transportation Needs (02/27/2023)   PRAPARE - Administrator, Civil Service (Medical): No    Lack of Transportation (Non-Medical): No  Physical Activity: Sufficiently Active (02/27/2023)   Exercise Vital Sign    Days of Exercise per Week: 5 days    Minutes of Exercise per Session: 50 min  Stress:  No Stress Concern Present (02/27/2023)   [employer] of Occupational Health - Occupational Stress Questionnaire    Feeling of Stress : Not at all  Social Connections: Socially Integrated (02/27/2023)   Social Connection and Isolation Panel [NHANES]    Frequency of Communication with Friends and Family: More than three times a week    Frequency of Social Gatherings with Friends and Family: Twice a week    Attends Religious Services: More than 4 times per year    Active Member of [employer] or Organizations: Yes    Attends Banker Meetings: 1 to 4 times per year    Marital Status: Married  Catering manager Violence: Not on file    Outpatient Medications Prior to Visit  Medication Sig Dispense Refill   LANTUS SOLOSTAR 100 UNIT/ML Solostar Pen INJECT 15 UNITS SUBCUTANEOUSLY ONCE DAILY 15 mL 6    lisinopril-hydrochlorothiazide (ZESTORETIC) 10-12.5 MG tablet Take 1 tablet by mouth once daily 90 tablet 2   rosuvastatin (CRESTOR) 5 MG tablet Take 1 tablet by mouth once daily 90 tablet 1   Semaglutide (RYBELSUS) 14 MG TABS Take 1 tablet (14 mg total) by mouth daily. 90 tablet 2   Vitamin D, Ergocalciferol, (DRISDOL) 1.25 MG (50000 UNIT) CAPS capsule Take  One every other week 12 capsule 2   empagliflozin (JARDIANCE) 25 MG TABS tablet Take 1 tablet (25 mg total) by mouth daily. 90 tablet 1   metFORMIN (GLUCOPHAGE) 1000 MG tablet Take 1 tablet (1,000 mg total) by mouth 2 (two) times daily with a meal. 180 tablet 1   doxycycline (VIBRA-TABS) 100 MG tablet Take 1 tablet (100 mg total) by mouth 2 (two) times daily. (Patient not taking: Reported on 02/27/2023) 20 tablet 0   ferrous sulfate 325 (65 FE) MG tablet Take 1 tablet (325 mg total) by mouth 2 (two) times daily.     fluticasone (FLONASE) 50 MCG/ACT nasal spray Place 2 sprays into both nostrils daily. 16 g 0   ibuprofen (ADVIL) 600 MG tablet Take 1 tablet (600 mg total) by mouth every 6 (six) hours as needed. 30 tablet 0   No facility-administered medications prior to visit.    No Known Allergies  Review of Systems  Constitutional:  Negative for chills, fever and malaise/fatigue.  HENT:  Negative for congestion and hearing loss.   Eyes:  Negative for discharge.  Respiratory:  Negative for cough, sputum production and shortness of breath.   Cardiovascular:  Negative for chest pain, palpitations and leg swelling.  Gastrointestinal:  Negative for abdominal pain, blood in stool, constipation, diarrhea, heartburn, nausea and vomiting.  Genitourinary:  Negative for dysuria, frequency, hematuria and urgency.  Musculoskeletal:  Negative for back pain, falls and myalgias.  Skin:  Negative for rash.  Neurological:  Negative for dizziness, sensory change, loss of consciousness, weakness and headaches.  Endo/Heme/Allergies:  Negative for  environmental allergies. Does not bruise/bleed easily.  Psychiatric/Behavioral:  Negative for depression and suicidal ideas. The patient is not nervous/anxious and does not have insomnia.        Objective:    Physical Exam Vitals and nursing note reviewed.  Constitutional:      Appearance: She is well-developed.  HENT:     Head: Normocephalic and atraumatic.  Eyes:     Conjunctiva/sclera: Conjunctivae normal.  Neck:     Thyroid: No thyromegaly.     Vascular: No carotid bruit or JVD.  Cardiovascular:     Rate and Rhythm: Normal rate and regular rhythm.  Heart sounds: Normal heart sounds. No murmur heard. Pulmonary:     Effort: Pulmonary effort is normal. No respiratory distress.     Breath sounds: Normal breath sounds. No wheezing or rales.  Chest:     Chest wall: No tenderness.  Musculoskeletal:     Cervical back: Normal range of motion and neck supple.  Skin:    Findings: Abscess and lesion present.     Comments: Multiple abscesses with yellow drainage  right axilla  Neurological:     Mental Status: She is alert and oriented to person, place, and time.     BP 138/72 (BP Location: Left Arm, Patient Position: Sitting, Cuff Size: Normal)   Pulse 65   Temp 98.1 F (36.7 C) (Oral)   Resp 18   Ht 5\' 8"  (1.727 m)   Wt 206 lb 3.2 oz (93.5 kg)   SpO2 99%   BMI 31.35 kg/m  Wt Readings from Last 3 Encounters:  02/27/23 206 lb 3.2 oz (93.5 kg)  08/18/22 202 lb (91.6 kg)  06/23/22 202 lb 3.2 oz (91.7 kg)       Assessment & Plan:  Essential hypertension -     CBC with Differential/Platelet -     Comprehensive metabolic panel  Type 2 diabetes mellitus without complication, without long-term current use of insulin (HCC) -     Empagliflozin; Take 1 tablet (25 mg total) by mouth daily.  Dispense: 90 tablet; Refill: 1 -     metFORMIN HCl; Take 1 tablet (1,000 mg total) by mouth 2 (two) times daily with a meal.  Dispense: 180 tablet; Refill: 1 -     Lipid panel -      Hemoglobin A1c  Hyperlipidemia associated with type 2 diabetes mellitus (HCC) -     CBC with Differential/Platelet -     Comprehensive metabolic panel -     Lipid panel -     Hemoglobin A1c  Abscess -     WOUND CULTURE -     Doxycycline Hyclate; Take 1 tablet (100 mg total) by mouth 2 (two) times daily.  Dispense: 20 tablet; Refill: 0 -     Ambulatory referral to General Surgery  Hyperlipidemia, unspecified hyperlipidemia type Assessment & Plan: Encourage heart healthy diet such as MIND or DASH diet, increase exercise, avoid trans fats, simple carbohydrates and processed foods, consider a krill or fish or flaxseed oil cap daily.     Primary hypertension Assessment & Plan: Well controlled, no changes to meds. Encouraged heart healthy diet such as the DASH diet and exercise as tolerated.        I,Rachel Rivera,acting as a Neurosurgeon for [employer], DO.,have documented all relevant documentation on the behalf of Donato Schultz, DO,as directed by  Donato Schultz, DO while in the presence of Donato Schultz, DO.   I, Donato Schultz, DO, personally preformed the services described in this documentation.  All medical record entries made by the scribe were at my direction and in my presence.  I have reviewed the chart and discharge instructions (if applicable) and agree that the record reflects my personal performance and is accurate and complete. 02/27/23   Donato Schultz, DO

## 2023-02-27 NOTE — Assessment & Plan Note (Signed)
Encourage heart healthy diet such as MIND or DASH diet, increase exercise, avoid trans fats, simple carbohydrates and processed foods, consider a krill or fish or flaxseed oil cap daily.  °

## 2023-02-27 NOTE — Assessment & Plan Note (Signed)
Well controlled, no changes to meds. Encouraged heart healthy diet such as the DASH diet and exercise as tolerated.  °

## 2023-03-02 ENCOUNTER — Encounter: Payer: Self-pay | Admitting: Family Medicine

## 2023-03-02 LAB — WOUND CULTURE
MICRO NUMBER:: 14886353
SPECIMEN QUALITY:: ADEQUATE

## 2023-03-03 ENCOUNTER — Telehealth: Payer: Self-pay | Admitting: Family Medicine

## 2023-03-03 MED ORDER — SULFAMETHOXAZOLE-TRIMETHOPRIM 800-160 MG PO TABS: 1.0000 | ORAL_TABLET | Freq: Two times a day (BID) | ORAL | 0 refills | Status: DC

## 2023-03-03 NOTE — Telephone Encounter (Signed)
Patient called stating she got a notification that a new medication was supposed to be sent. Please advise.

## 2023-03-03 NOTE — Telephone Encounter (Signed)
Pt viewed via Mychart and called the office. Rx was sent

## 2023-03-03 NOTE — Telephone Encounter (Signed)
Rx sent 

## 2023-05-19 ENCOUNTER — Other Ambulatory Visit: Payer: PRIVATE HEALTH INSURANCE | Admitting: Pharmacist

## 2023-05-19 ENCOUNTER — Encounter: Payer: Self-pay | Admitting: Family Medicine

## 2023-05-19 DIAGNOSIS — E1165 Type 2 diabetes mellitus with hyperglycemia: Secondary | ICD-10-CM

## 2023-05-19 MED ORDER — FREESTYLE LIBRE 3 SENSOR MISC: 5 refills | Status: DC

## 2023-05-19 NOTE — Progress Notes (Unsigned)
05/19/2023 Name: Kathleen Acevedo MRN: [id number] DOB: 09-25-1961  Chief Complaint  Patient presents with   Diabetes    CGM    Kathleen Acevedo is a 62 y.o. year old female who presented for a telephone visit.   They were referred to the pharmacist by their PCP for assistance in managing diabetes and and assistance with Continuous Glucose Monitor  .    Subjective: Patient would like to start using [employer] 3 Continuous Glucose Monitor system to monitor blood glucose.   Care Team: Primary Care Provider: Zola Button, Grayling Congress, DO ; Next Scheduled Visit: 08/21/2023  Patient reports affordability concerns with their medications: No   - reports cost of Jardiance is $10 and Lantus is $35 Patient reports access/transportation concerns to their pharmacy: No  Patient reports adherence concerns with their medications:  No     Diabetes:  Current medications: Lantus 20 units once a day, metformin 1000mg  twice a day, Rybelsus 14mg  daily and Jardiance 25mg  daily.   Medications tried in the past: Trulicity / Victoza - cost prohibitive at that time prescribed.   Using TruTrack meter; testing 3 times daily    Objective:  Lab Results  Component Value Date   HGBA1C 7.7 (H) 08/18/2022    Lab Results  Component Value Date   CREATININE 1.02 08/18/2022   BUN 23 08/18/2022   NA 141 08/18/2022   K 4.2 08/18/2022   CL 105 08/18/2022   CO2 28 08/18/2022    Lab Results  Component Value Date   CHOL 138 08/18/2022   HDL 49.60 08/18/2022   LDLCALC 61 08/18/2022   LDLDIRECT 103.6 08/23/2012   TRIG 136.0 08/18/2022   CHOLHDL 3 08/18/2022    Medications Reviewed Today     Reviewed by Henrene Pastor, RPH-CPP (Pharmacist) on 05/19/23 at 1045  Med List Status: <None>   Medication Order Taking? Sig Documenting Provider Last Dose Status Informant  empagliflozin (JARDIANCE) 25 MG TABS tablet [id number] Yes Take 1 tablet (25 mg total) by mouth daily. Zola Button, Myrene Buddy R, DO Taking Active   LANTUS  SOLOSTAR 100 UNIT/ML Solostar Pen [id number] Yes INJECT 15 UNITS SUBCUTANEOUSLY ONCE DAILY  Patient taking differently: 20 Units daily. INJECT 15 UNITS SUBCUTANEOUSLY ONCE DAILY   Zola Button, [place], DO Taking Active   lisinopril-hydrochlorothiazide (ZESTORETIC) 10-12.5 MG tablet [id number] Yes Take 1 tablet by mouth once daily Zola Button, Grayling Congress, DO Taking Active   metFORMIN (GLUCOPHAGE) 1000 MG tablet [id number] Yes Take 1 tablet (1,000 mg total) by mouth 2 (two) times daily with a meal. Zola Button, Grayling Congress, DO Taking Active   rosuvastatin (CRESTOR) 5 MG tablet [id number] Yes Take 1 tablet by mouth once daily Zola Button, Grayling Congress, DO Taking Active   Semaglutide (RYBELSUS) 14 MG TABS [id number] Yes Take 1 tablet (14 mg total) by mouth daily. Donato Schultz, DO Taking Active   Vitamin D, Ergocalciferol, (DRISDOL) 1.25 MG (50000 UNIT) CAPS capsule [id number] Yes Take  One every other week Zola Button, Grayling Congress, DO Taking Active               Assessment/Plan:   Diabetes: - Currently uncontrolled -A1c not at goal of < 7.0% - Recommend to continue current medications for diabetes - Recommend to check glucose 3 to 4 times a day. Sent Rx for [place] 3 to her pharmacy but cost was $75 which is the cost with manufacturer coupon when sensors are not covered by insurance.  - Called her insurance  and was not able to get direct answer about Continuous Glucose Monitor sensor coverage. They started prior authorization with cover my meds and I completed but prior authorization was denied "product not covered - plan exclusion"; I called again with patient on conference call. Representative again was not sure about coverage so another prior authorization was started. Received the same denial "product not covered - plan exclusion";     Henrene Pastor, PharmD Clinical Pharmacist [facility]

## 2023-07-23 ENCOUNTER — Other Ambulatory Visit: Payer: Self-pay | Admitting: Family Medicine

## 2023-07-23 DIAGNOSIS — E559 Vitamin D deficiency, unspecified: Secondary | ICD-10-CM

## 2023-07-24 ENCOUNTER — Other Ambulatory Visit (HOSPITAL_COMMUNITY): Payer: Self-pay

## 2023-07-27 ENCOUNTER — Telehealth: Payer: Self-pay

## 2023-07-27 ENCOUNTER — Other Ambulatory Visit (HOSPITAL_COMMUNITY): Payer: Self-pay

## 2023-07-27 NOTE — Telephone Encounter (Signed)
Pharmacy Patient Advocate Encounter   Received notification from CoverMyMeds that prior authorization for Rybelsus 14MG  tablets is required/requested.   Insurance verification completed.   The patient is insured through [employer] .   Per test claim: PA required; PA submitted to [employer] via CoverMyMeds Key/confirmation #/EOC [id number] Status is pending    [employer] Renewals Program

## 2023-07-27 NOTE — Telephone Encounter (Signed)
Pt called In regards to filling her Rybelsus prescription. Advised there is a prior [place] pending

## 2023-08-02 NOTE — Telephone Encounter (Signed)
Pharmacy Patient Advocate Encounter  Received notification from [employer] that Prior Authorization for Rybelsus 14MG  tablets has been APPROVED from 07-27-2023 to 07-26-2024   PA #/Case ID/Reference #: [id number]

## 2023-08-04 ENCOUNTER — Other Ambulatory Visit: Payer: Self-pay | Admitting: Family Medicine

## 2023-08-04 DIAGNOSIS — Z1211 Encounter for screening for malignant neoplasm of colon: Secondary | ICD-10-CM

## 2023-08-04 DIAGNOSIS — Z1212 Encounter for screening for malignant neoplasm of rectum: Secondary | ICD-10-CM

## 2023-08-08 ENCOUNTER — Other Ambulatory Visit: Payer: Self-pay | Admitting: Family Medicine

## 2023-08-08 DIAGNOSIS — E1169 Type 2 diabetes mellitus with other specified complication: Secondary | ICD-10-CM

## 2023-08-11 ENCOUNTER — Telehealth: Payer: Self-pay | Admitting: Family Medicine

## 2023-08-11 DIAGNOSIS — E119 Type 2 diabetes mellitus without complications: Secondary | ICD-10-CM

## 2023-08-11 MED ORDER — LANTUS SOLOSTAR 100 UNIT/ML ~~LOC~~ SOPN: PEN_INJECTOR | SUBCUTANEOUS | 6 refills | Status: DC

## 2023-08-11 NOTE — Telephone Encounter (Signed)
Prescription Request  08/11/2023  Is this a "Controlled Substance" medicine? No  LOV: 02/27/2023  What is the name of the medication or equipment?   LANTUS SOLOSTAR 100 UNIT/ML Solostar Pen [[id number]]  Have you contacted your pharmacy to request a refill? No   Which pharmacy would you like this sent to?  [id number] - [place], [place] - [address] [place] [address] Phone: [phone] Fax: [phone]    Patient notified that their request is being sent to the clinical staff for review and that they should receive a response within 2 business days.   Please advise at Mobile [phone] (mobile)

## 2023-08-11 NOTE — Telephone Encounter (Signed)
Rx sent 

## 2023-08-11 NOTE — Addendum Note (Signed)
Addended by: Roxanne Gates on: 08/11/2023 01:24 PM   Modules accepted: Orders

## 2023-08-21 ENCOUNTER — Ambulatory Visit (INDEPENDENT_AMBULATORY_CARE_PROVIDER_SITE_OTHER): Payer: PRIVATE HEALTH INSURANCE | Admitting: Family Medicine

## 2023-08-21 ENCOUNTER — Encounter: Payer: Self-pay | Admitting: Family Medicine

## 2023-08-21 VITALS — BP 124/68 | HR 62 | Temp 97.8°F | Resp 18 | Ht 68.0 in | Wt 203.6 lb

## 2023-08-21 DIAGNOSIS — E785 Hyperlipidemia, unspecified: Secondary | ICD-10-CM

## 2023-08-21 DIAGNOSIS — Z794 Long term (current) use of insulin: Secondary | ICD-10-CM | POA: Diagnosis not present

## 2023-08-21 DIAGNOSIS — E1169 Type 2 diabetes mellitus with other specified complication: Secondary | ICD-10-CM | POA: Diagnosis not present

## 2023-08-21 DIAGNOSIS — Z0000 Encounter for general adult medical examination without abnormal findings: Secondary | ICD-10-CM

## 2023-08-21 DIAGNOSIS — I10 Essential (primary) hypertension: Secondary | ICD-10-CM | POA: Diagnosis not present

## 2023-08-21 DIAGNOSIS — Z7984 Long term (current) use of oral hypoglycemic drugs: Secondary | ICD-10-CM | POA: Diagnosis not present

## 2023-08-21 DIAGNOSIS — Z23 Encounter for immunization: Secondary | ICD-10-CM | POA: Diagnosis not present

## 2023-08-21 DIAGNOSIS — E1165 Type 2 diabetes mellitus with hyperglycemia: Secondary | ICD-10-CM

## 2023-08-21 DIAGNOSIS — E559 Vitamin D deficiency, unspecified: Secondary | ICD-10-CM

## 2023-08-21 DIAGNOSIS — E119 Type 2 diabetes mellitus without complications: Secondary | ICD-10-CM

## 2023-08-21 LAB — MICROALBUMIN / CREATININE URINE RATIO
Creatinine,U: 60.8 mg/dL
Microalb Creat Ratio: 1.2 mg/g (ref 0.0–30.0)
Microalb, Ur: <0.7 mg/dL (ref 0.0–1.9)

## 2023-08-21 LAB — COMPREHENSIVE METABOLIC PANEL
ALT: 11 U/L (ref 0–35)
AST: 13 U/L (ref 0–37)
Albumin: 3.9 g/dL (ref 3.5–5.2)
Alkaline Phosphatase: 43 U/L (ref 39–117)
BUN: 24 mg/dL — ABNORMAL HIGH (ref 6–23)
CO2: 29 meq/L (ref 19–32)
Calcium: 9.4 mg/dL (ref 8.4–10.5)
Chloride: 104 meq/L (ref 96–112)
Creatinine, Ser: 1.06 mg/dL (ref 0.40–1.20)
GFR: 55.69 mL/min — ABNORMAL LOW (ref >60.00)
Glucose, Bld: 102 mg/dL — ABNORMAL HIGH (ref 70–99)
Potassium: 4.4 meq/L (ref 3.5–5.1)
Sodium: 140 meq/L (ref 135–145)
Total Bilirubin: 0.4 mg/dL (ref 0.2–1.2)
Total Protein: 6.2 g/dL (ref 6.0–8.3)

## 2023-08-21 LAB — CBC WITH DIFFERENTIAL/PLATELET
Basophils Absolute: 0 10*3/uL (ref 0.0–0.1)
Basophils Relative: 0.5 % (ref 0.0–3.0)
Eosinophils Absolute: 0.2 10*3/uL (ref 0.0–0.7)
Eosinophils Relative: 4 % (ref 0.0–5.0)
HCT: 38.4 % (ref 36.0–46.0)
Hemoglobin: 12.2 g/dL (ref 12.0–15.0)
Lymphocytes Relative: 38 % (ref 12.0–46.0)
Lymphs Abs: 1.7 10*3/uL (ref 0.7–4.0)
MCHC: 31.7 g/dL (ref 30.0–36.0)
MCV: 93.6 fL (ref 78.0–100.0)
Monocytes Absolute: 0.4 10*3/uL (ref 0.1–1.0)
Monocytes Relative: 8 % (ref 3.0–12.0)
Neutro Abs: 2.2 10*3/uL (ref 1.4–7.7)
Neutrophils Relative %: 49.5 % (ref 43.0–77.0)
Platelets: 225 10*3/uL (ref 150.0–400.0)
RBC: 4.1 Mil/uL (ref 3.87–5.11)
RDW: 14.2 % (ref 11.5–15.5)
WBC: 4.4 10*3/uL (ref 4.0–10.5)

## 2023-08-21 LAB — LIPID PANEL
Cholesterol: 122 mg/dL (ref 0–200)
HDL: 45.7 mg/dL (ref >39.00)
LDL Cholesterol: 51 mg/dL (ref 0–99)
NonHDL: 76.16
Total CHOL/HDL Ratio: 3
Triglycerides: 124 mg/dL (ref 0.0–149.0)
VLDL: 24.8 mg/dL (ref 0.0–40.0)

## 2023-08-21 LAB — HEMOGLOBIN A1C: Hgb A1c MFr Bld: 7.1 % — ABNORMAL HIGH (ref 4.6–6.5)

## 2023-08-21 MED ORDER — METFORMIN HCL 1000 MG PO TABS: 1000.0000 mg | ORAL_TABLET | Freq: Two times a day (BID) | ORAL | 1 refills | Status: DC

## 2023-08-21 MED ORDER — EMPAGLIFLOZIN 25 MG PO TABS: 25.0000 mg | ORAL_TABLET | Freq: Every day | ORAL | 1 refills | Status: DC

## 2023-08-21 MED ORDER — LISINOPRIL-HYDROCHLOROTHIAZIDE 10-12.5 MG PO TABS: 1.0000 | ORAL_TABLET | Freq: Every day | ORAL | 2 refills | Status: DC

## 2023-08-21 NOTE — Assessment & Plan Note (Signed)
hgba1c to be checked, minimize simple carbs. Increase exercise as tolerated. Continue current meds  

## 2023-08-21 NOTE — Assessment & Plan Note (Signed)
Encourage heart healthy diet such as MIND or DASH diet, increase exercise, avoid trans fats, simple carbohydrates and processed foods, consider a krill or fish or flaxseed oil cap daily.  °

## 2023-08-21 NOTE — Assessment & Plan Note (Signed)
Ghm utd Check labs  See AV[facility] Health Maintenance  Topic Date Due   Fecal DNA (Cologuard)  Never done   OPHTHALMOLOGY EXAM  11/02/2022   HEMOGLOBIN A1C  02/17/2023   COVID-19 Vaccine (3 - 2023-24 season) 07/02/2023   Diabetic kidney evaluation - eGFR measurement  08/19/2023   Diabetic kidney evaluation - Urine ACR  08/19/2023   HIV Screening  02/27/2024 (Originally 07/24/1976)   Zoster Vaccines- Shingrix (2 of 2) 10/16/2023   MAMMOGRAM  12/01/2023   FOOT EXAM  02/27/2024   DTaP/Tdap/Td (3 - Td or Tdap) 01/15/2032   INFLUENZA VACCINE  Completed   Hepatitis C Screening  Completed   HPV VACCINES  Aged Out   Colonoscopy  Discontinued

## 2023-08-21 NOTE — Assessment & Plan Note (Signed)
Well controlled, no changes to meds. Encouraged heart healthy diet such as the DASH diet and exercise as tolerated.  °

## 2023-08-21 NOTE — Progress Notes (Signed)
Established Patient Office Visit  Subjective   Patient ID: Kathleen Acevedo, female    DOB: Oct 19, 1961  Age: 62 y.o. MRN: [id number]  Chief Complaint  Patient presents with   Annual Exam    Pt states fasting     HPI Discussed the use of AI scribe software for clinical note transcription with the patient, who gave verbal consent to proceed.  History of Present Illness   The patient, with a history of diabetes, presents for a routine check-up. She reports that she has been managing her diabetes with insulin and has recently refilled her prescription for Lantus. She has been checking her blood sugars regularly and reports that she is generally good, with occasional high readings that she attributes to overconsumption of carbohydrates.  The patient also discusses her upcoming colonoscopy. She had a colonoscopy ten years ago and has recently received a Cologuard test in the mail. She expresses some confusion and discomfort about the process of collecting a stool sample for the Cologuard test, but agrees to try it.  In addition to her physical health, the patient discusses a recent personal tragedy. Her son was killed in April, and the ongoing legal proceedings have been a source of stress. The patient expresses frustration with the legal process and the actions of the accused.  The patient also mentions a recent visit to the eye doctor and the need to find a new provider. She lives in [place] and is looking for a provider closer to her home.      Patient Active Problem List   Diagnosis Date Noted   Preventative health care 08/18/2022   Abscess, neck 09/29/2021   Other hyperlipidemia 01/27/2021   Hyperlipidemia associated with type 2 diabetes mellitus (HCC) 11/05/2018   Diabetes mellitus, type II (HCC) 11/05/2018   Morbid obesity (HCC) 11/05/2018   Contracture of tendon sheath 12/26/2014   Sinusitis 11/24/2014   Routine general medical examination at a health care facility 08/23/2012    Uncontrolled type 2 diabetes mellitus with hyperglycemia, with long-term current use of insulin (HCC) 02/27/2008   Hyperlipidemia 02/27/2008   Primary hypertension 02/27/2008   Past Medical History:  Diagnosis Date   Diabetes mellitus    Hyperlipidemia    Hypertension    Past Surgical History:  Procedure Laterality Date   ABDOMINAL HYSTERECTOMY     BREAST BIOPSY Left 09/19/2013    Fibrocystic changes and fibroadenomatoid changes    BREAST BIOPSY Right 09/11/2020   fibroadenoma with calcs   OOPHORECTOMY     Social History   Tobacco Use   Smoking status: Never   Smokeless tobacco: Never  Vaping Use   Vaping status: Never Used  Substance Use Topics   Alcohol use: No    Alcohol/week: 0.0 standard drinks of alcohol   Drug use: No   Social History   Socioeconomic History   Marital status: Married    Spouse name: Deyanna Jabour   Number of children: 1   Years of education: Not on file   Highest education level: Some college, no degree  Occupational History   Occupation: Occupational hygienist  Tobacco Use   Smoking status: Never   Smokeless tobacco: Never  Vaping Use   Vaping status: Never Used  Substance and Sexual Activity   Alcohol use: No    Alcohol/week: 0.0 standard drinks of alcohol   Drug use: No   Sexual activity: Yes    Partners: Male  Other Topics Concern   Not on file  Social  History Narrative   Exercise --  4-5 days a week-- walking    Social Determinants of Health   Financial Resource Strain: Low Risk  (02/27/2023)   Overall Financial Resource Strain (CARDIA)    Difficulty of Paying Living Expenses: Not hard at all  Food Insecurity: No Food Insecurity (02/27/2023)   Hunger Vital Sign    Worried About Running Out of Food in the Last Year: Never true    Ran Out of Food in the Last Year: Never true  Transportation Needs: No Transportation Needs (02/27/2023)   PRAPARE - Administrator, Civil Service (Medical): No    Lack of Transportation  (Non-Medical): No  Physical Activity: Sufficiently Active (02/27/2023)   Exercise Vital Sign    Days of Exercise per Week: 5 days    Minutes of Exercise per Session: 50 min  Stress: No Stress Concern Present (02/27/2023)   [employer] of Occupational Health - Occupational Stress Questionnaire    Feeling of Stress : Not at all  Social Connections: Socially Integrated (02/27/2023)   Social Connection and Isolation Panel [NHANES]    Frequency of Communication with Friends and Family: More than three times a week    Frequency of Social Gatherings with Friends and Family: Twice a week    Attends Religious Services: More than 4 times per year    Active Member of [employer] or Organizations: Yes    Attends Banker Meetings: 1 to 4 times per year    Marital Status: Married  Catering manager Violence: Unknown (01/31/2022)   Received from [employer], [facility]   HITS    Physically Hurt: Not on file    Insult or Talk Down To: Not on file    Threaten Physical Harm: Not on file    Scream or Curse: Not on file   Family Status  Relation Name Status   Mother  Alive   Father  Deceased at age 43   Neg Hx  (Not Specified)  No partnership data on file   Family History  Problem Relation Age of Onset   Diabetes Father    High blood pressure Father    Obesity Father    Colon cancer Neg Hx    Colon polyps Neg Hx    No Known Allergies    Review of Systems  Constitutional:  Negative for fever and malaise/fatigue.  HENT:  Negative for congestion.   Eyes:  Negative for blurred vision.  Respiratory:  Negative for shortness of breath.   Cardiovascular:  Negative for chest pain, palpitations and leg swelling.  Gastrointestinal:  Negative for abdominal pain, blood in stool and nausea.  Genitourinary:  Negative for dysuria and frequency.  Musculoskeletal:  Negative for falls.  Skin:  Negative for rash.  Neurological:  Negative for dizziness, loss of consciousness and headaches.   Endo/Heme/Allergies:  Negative for environmental allergies.  Psychiatric/Behavioral:  Negative for depression. The patient is not nervous/anxious.       Objective:     BP 124/68 (BP Location: Left Arm, Patient Position: Sitting, Cuff Size: Normal)   Pulse 62   Temp 97.8 F (36.6 C) (Oral)   Resp 18   Ht 5\' 8"  (1.727 m)   Wt 203 lb 9.6 oz (92.4 kg)   SpO2 100%   BMI 30.96 kg/m  BP Readings from Last 3 Encounters:  08/21/23 124/68  02/27/23 138/72  08/18/22 130/78   Wt Readings from Last 3 Encounters:  08/21/23 203 lb 9.6 oz (92.4  kg)  02/27/23 206 lb 3.2 oz (93.5 kg)  08/18/22 202 lb (91.6 kg)   SpO2 Readings from Last 3 Encounters:  08/21/23 100%  02/27/23 99%  08/18/22 97%      Physical Exam Vitals and nursing note reviewed.  Constitutional:      General: She is not in acute distress.    Appearance: Normal appearance. She is well-developed.  HENT:     Head: Normocephalic and atraumatic.     Right Ear: Tympanic membrane, ear canal and external ear normal. There is no impacted cerumen.     Left Ear: Tympanic membrane, ear canal and external ear normal. There is no impacted cerumen.     Nose: Nose normal.     Mouth/Throat:     Mouth: Mucous membranes are moist.     Pharynx: Oropharynx is clear. No oropharyngeal exudate or posterior oropharyngeal erythema.  Eyes:     General: No scleral icterus.       Right eye: No discharge.        Left eye: No discharge.     Conjunctiva/sclera: Conjunctivae normal.     Pupils: Pupils are equal, round, and reactive to light.  Neck:     Thyroid: No thyromegaly or thyroid tenderness.     Vascular: No JVD.  Cardiovascular:     Rate and Rhythm: Normal rate and regular rhythm.     Heart sounds: Normal heart sounds. No murmur heard. Pulmonary:     Effort: Pulmonary effort is normal. No respiratory distress.     Breath sounds: Normal breath sounds.  Abdominal:     General: Bowel sounds are normal. There is no distension.      Palpations: Abdomen is soft. There is no mass.     Tenderness: There is no abdominal tenderness. There is no guarding or rebound.  Genitourinary:    Vagina: Normal.  Musculoskeletal:        General: Normal range of motion.     Cervical back: Normal range of motion and neck supple.     Right lower leg: No edema.     Left lower leg: No edema.  Lymphadenopathy:     Cervical: No cervical adenopathy.  Skin:    General: Skin is warm and dry.     Findings: No erythema or rash.  Neurological:     Mental Status: She is alert and oriented to person, place, and time.     Cranial Nerves: No cranial nerve deficit.     Deep Tendon Reflexes: Reflexes are normal and symmetric.  Psychiatric:        Mood and Affect: Mood normal.        Behavior: Behavior normal.        Thought Content: Thought content normal.        Judgment: Judgment normal.      No results found for any visits on 08/21/23.  Last CBC Lab Results  Component Value Date   WBC 4.6 08/18/2022   HGB 11.5 (L) 08/18/2022   HCT 35.8 (L) 08/18/2022   MCV 90.8 08/18/2022   MCH 29.2 09/13/2013   RDW 14.8 08/18/2022   PLT 246.0 08/18/2022   Last metabolic panel Lab Results  Component Value Date   GLUCOSE 125 (H) 08/18/2022   NA 141 08/18/2022   K 4.2 08/18/2022   CL 105 08/18/2022   CO2 28 08/18/2022   BUN 23 08/18/2022   CREATININE 1.02 08/18/2022   GFR 58.73 (L) 08/18/2022   CALCIUM 9.4 08/18/2022  PROT 6.3 08/18/2022   ALBUMIN 3.9 08/18/2022   LABGLOB 2.5 08/23/2018   AGRATIO 1.8 08/23/2018   BILITOT 0.3 08/18/2022   ALKPHOS 54 08/18/2022   AST 13 08/18/2022   ALT 11 08/18/2022   Last lipids Lab Results  Component Value Date   CHOL 138 08/18/2022   HDL 49.60 08/18/2022   LDLCALC 61 08/18/2022   LDLDIRECT 103.6 08/23/2012   TRIG 136.0 08/18/2022   CHOLHDL 3 08/18/2022   Last hemoglobin A1c Lab Results  Component Value Date   HGBA1C 7.7 (H) 08/18/2022   Last thyroid functions Lab Results  Component  Value Date   TSH 1.29 08/18/2022   T3TOTAL 87 03/27/2018   T4TOTAL 8.4 01/09/2007   Last vitamin D Lab Results  Component Value Date   VD25OH 48.52 08/18/2022   Last vitamin B12 and Folate Lab Results  Component Value Date   VITAMINB12 735 03/27/2018   FOLATE >20.0 03/27/2018      The 10-year ASCVD risk score (Arnett DK, et al., 2019) is: 13.5%    Assessment & Plan:   Problem List Items Addressed This Visit       Unprioritized   Diabetes mellitus, type II (HCC)   Relevant Medications   empagliflozin (JARDIANCE) 25 MG TABS tablet   lisinopril-hydrochlorothiazide (ZESTORETIC) 10-12.5 MG tablet   metFORMIN (GLUCOPHAGE) 1000 MG tablet   Other Relevant Orders   Comprehensive metabolic panel   Hemoglobin A1c   Microalbumin / creatinine urine ratio   Hyperlipidemia associated with type 2 diabetes mellitus (HCC) - Primary   Relevant Medications   empagliflozin (JARDIANCE) 25 MG TABS tablet   lisinopril-hydrochlorothiazide (ZESTORETIC) 10-12.5 MG tablet   metFORMIN (GLUCOPHAGE) 1000 MG tablet   Other Relevant Orders   Lipid panel   Comprehensive metabolic panel   Other Visit Diagnoses     Essential hypertension       Relevant Medications   lisinopril-hydrochlorothiazide (ZESTORETIC) 10-12.5 MG tablet   Other Relevant Orders   Lipid panel   CBC with Differential/Platelet   Vitamin D deficiency       Need for shingles vaccine       Relevant Orders   Zoster Recombinant (Shingrix ) (Completed)     Assessment and Plan    Diabetes Patient is on Lantus insulin and checks blood sugars regularly. Reports good control but occasional high readings due to high carbohydrate intake. -Continue current management and monitor blood sugars.  Eye Health Last eye exam was in January 2023. Patient is currently seeking a new ophthalmologist. -Recommended [facility].  Colon Cancer Screening Patient has received a Cologuard kit but has not completed  it yet. -Encouraged patient to complete and return Cologuard kit. If unable, consider scheduling a colonoscopy.  Shingles Vaccination Discussed the benefits and potential side effects of the shingles vaccine. Patient agreed to receive the first dose today. -Administer first dose of shingles vaccine today. Schedule second dose in 2-6 months.  General Health Maintenance Mammogram due next month. Bone density test completed. Flu shot received at work. -Schedule mammogram for next month. Continue current health maintenance measures.        No follow-ups on file.    Donato Schultz, DO

## 2023-09-01 ENCOUNTER — Encounter: Payer: Self-pay | Admitting: Family Medicine

## 2023-09-08 ENCOUNTER — Encounter: Payer: Self-pay | Admitting: Gastroenterology

## 2023-09-18 ENCOUNTER — Other Ambulatory Visit: Payer: Self-pay | Admitting: Family Medicine

## 2023-09-18 DIAGNOSIS — E119 Type 2 diabetes mellitus without complications: Secondary | ICD-10-CM

## 2023-10-04 ENCOUNTER — Other Ambulatory Visit: Payer: Self-pay | Admitting: Family Medicine

## 2023-11-22 ENCOUNTER — Other Ambulatory Visit: Payer: Self-pay | Admitting: Family Medicine

## 2023-11-22 DIAGNOSIS — Z1231 Encounter for screening mammogram for malignant neoplasm of breast: Secondary | ICD-10-CM

## 2023-12-08 ENCOUNTER — Ambulatory Visit
Admission: RE | Admit: 2023-12-08 | Discharge: 2023-12-08 | Disposition: A | Discharge status: Home / Self Care | Payer: PRIVATE HEALTH INSURANCE | Source: Ambulatory Visit | Attending: Family Medicine | Admitting: Family Medicine

## 2023-12-08 DIAGNOSIS — Z1231 Encounter for screening mammogram for malignant neoplasm of breast: Secondary | ICD-10-CM

## 2023-12-12 ENCOUNTER — Other Ambulatory Visit: Payer: Self-pay | Admitting: Family Medicine

## 2023-12-12 DIAGNOSIS — E559 Vitamin D deficiency, unspecified: Secondary | ICD-10-CM

## 2023-12-19 ENCOUNTER — Encounter: Payer: Self-pay | Admitting: Family Medicine

## 2023-12-20 ENCOUNTER — Other Ambulatory Visit: Payer: Self-pay | Admitting: Family Medicine

## 2023-12-20 DIAGNOSIS — E119 Type 2 diabetes mellitus without complications: Secondary | ICD-10-CM

## 2023-12-20 MED ORDER — NOVOLIN 70/30 FLEXPEN (70-30) 100 UNIT/ML ~~LOC~~ SUPN: PEN_INJECTOR | SUBCUTANEOUS | 11 refills | Status: DC

## 2023-12-24 ENCOUNTER — Other Ambulatory Visit: Payer: Self-pay | Admitting: Family Medicine

## 2024-01-06 ENCOUNTER — Other Ambulatory Visit: Payer: Self-pay | Admitting: Family Medicine

## 2024-01-06 DIAGNOSIS — E559 Vitamin D deficiency, unspecified: Secondary | ICD-10-CM

## 2024-01-09 ENCOUNTER — Encounter: Payer: Self-pay | Admitting: Family Medicine

## 2024-01-22 ENCOUNTER — Other Ambulatory Visit: Payer: Self-pay | Admitting: Family Medicine

## 2024-01-22 DIAGNOSIS — E1169 Type 2 diabetes mellitus with other specified complication: Secondary | ICD-10-CM

## 2024-02-01 ENCOUNTER — Other Ambulatory Visit: Payer: Self-pay | Admitting: Family Medicine

## 2024-02-01 DIAGNOSIS — E559 Vitamin D deficiency, unspecified: Secondary | ICD-10-CM

## 2024-02-07 ENCOUNTER — Encounter: Payer: Self-pay | Admitting: Family Medicine

## 2024-02-12 ENCOUNTER — Ambulatory Visit: Payer: PRIVATE HEALTH INSURANCE | Admitting: Family Medicine

## 2024-02-19 ENCOUNTER — Ambulatory Visit: Payer: PRIVATE HEALTH INSURANCE | Admitting: Family Medicine

## 2024-02-27 ENCOUNTER — Encounter: Payer: Self-pay | Admitting: Family Medicine

## 2024-03-01 ENCOUNTER — Other Ambulatory Visit (HOSPITAL_COMMUNITY): Payer: Self-pay

## 2024-03-01 ENCOUNTER — Other Ambulatory Visit: Payer: Self-pay | Admitting: Family Medicine

## 2024-03-01 ENCOUNTER — Telehealth: Payer: Self-pay

## 2024-03-01 DIAGNOSIS — E119 Type 2 diabetes mellitus without complications: Secondary | ICD-10-CM

## 2024-03-01 MED ORDER — OZEMPIC (0.25 OR 0.5 MG/DOSE) 2 MG/3ML ~~LOC~~ SOPN: PEN_INJECTOR | SUBCUTANEOUS | 2 refills | Status: DC

## 2024-03-01 NOTE — Telephone Encounter (Signed)
 Pharmacy Patient Advocate Encounter   Received notification from CoverMyMeds that prior authorization for Ozempic (0.25 or 0.5 MG/DOSE) 2MG /3ML pen-injectors is required/requested.   Insurance verification completed.   The patient is insured through [employer] .   Per test claim: PA required; PA submitted to above mentioned insurance via CoverMyMeds Key/confirmation #/EOC (Key: [id number])    Status is pending

## 2024-03-04 ENCOUNTER — Other Ambulatory Visit: Payer: Self-pay | Admitting: Family Medicine

## 2024-03-04 DIAGNOSIS — E559 Vitamin D deficiency, unspecified: Secondary | ICD-10-CM

## 2024-03-06 ENCOUNTER — Other Ambulatory Visit (HOSPITAL_COMMUNITY): Payer: Self-pay

## 2024-03-06 NOTE — Telephone Encounter (Signed)
 Pharmacy Patient Advocate Encounter  Received notification from [employer] that Prior Authorization for Ozempic (0.25 or 0.5 MG/DOSE) 2MG /3ML pen-injectors has been APPROVED from 5.6.25 to 5.6.26. Ran test claim, Copay is $RTS , RX WAS FILLED ON 5/3. This test claim was processed through [facility]- copay amounts may vary at other pharmacies due to pharmacy/plan contracts, or as the patient moves through the different stages of their insurance plan.   PA #/Case ID/Reference #: ([id number])

## 2024-03-11 ENCOUNTER — Ambulatory Visit: Payer: Self-pay

## 2024-03-11 NOTE — Telephone Encounter (Signed)
 Copied from CRM [id number]. Topic: Clinical - Red Word Triage >> Mar 11, 2024 12:31 PM Kathleen Acevedo wrote: [facility] that prompted transfer to Nurse Triage: severe vomiting, stomach cramps   Chief Complaint: Abdominal Pain, Vomiting Symptoms: pain, vomiting, some diarrhea Frequency: 2 night ago Pertinent Negatives: Patient denies fever, vomiting blood, vomiting coffee ground type of material, chest pain, difficulty breathing, headache, constipation, swollen abdomen Disposition: [] ED /[] Urgent Care (no appt availability in office) / [] Appointment(In office/virtual)/ []  [facility]/ [] Home Care/ [] Refused Recommended Disposition /[] [facility]/ []  Follow-up with PCP Additional Notes: Patient called and advised that she has been having abdominal pain in the upper abdomen and vomiting since the past two nights. She states that someone at her job had a stomach virus last week. She was told that several other people left with stomach issues as well. Saturday--vomited 4-5 times Yesterday--no vomiting Today--vomited once Patient states she feels better after she vomits and she is able to take small sips of fluids but she just can't take intake big amounts of fluid at one time. Patient denies fever, vomiting blood, vomiting coffee ground type of material, chest pain, difficulty breathing, constipation, swollen abdomen, headache.  Patient states that her blood sugar has been good. She states that the only major abdominal issue or surgery she has had was a hysterectomy.  Patient states that she has no abdominal pain at this time at all.  She states that it only hurts when she has to vomit or is vomiting.    Patient is advised that the recommendation is that she is seen/evaluated by a provider in the next 24 hours. She is advised that appointments are available at her PCP office for tomorrow unless she wants to be seen today then Urgent Care and the Emergency Room are recommended as  alternatives for today. Patient is advised that if she has any questions she can call us  back. Patient didn't want to make an appointment at this time. She states that she is going to see how she feels in the next couple of hours and if she isn't any better she will go to the Emergency Room. She is also given Care Advice as per protocol. She is advised that if anything worsens at all to go to the Emergency Room. Patient verbalized understanding.      Reason for Disposition  Age > 60 years  Answer Assessment - Initial Assessment Questions 1. LOCATION: "Where does it hurt?"      Upper 2. RADIATION: "Does the pain shoot anywhere else?" (e.g., chest, back)     Nonradiating 3. ONSET: "When did the pain begin?" (e.g., minutes, hours or days ago)      2 nights ago 4. SUDDEN: "Gradual or sudden onset?"     ---- 5. PATTERN "Does the pain come and go, or is it constant?"    - If it comes and goes: "How long does it last?" "Do you have pain now?"     (Note: Comes and goes means the pain is intermittent. It goes away completely between bouts.)    - If constant: "Is it getting better, staying the same, or getting worse?"      (Note: Constant means the pain never goes away completely; most serious pain is constant and gets worse.)      Not constant--trying to eat makes the pain come on 6. SEVERITY: "How bad is the pain?"  (e.g., Scale 1-10; mild, moderate, or severe)    - MILD (1-3):  Doesn't interfere with normal activities, abdomen soft and not tender to touch.     - MODERATE (4-7): Interferes with normal activities or awakens from sleep, abdomen tender to touch.     - SEVERE (8-10): Excruciating pain, doubled over, unable to do any normal activities.       8 7. RECURRENT SYMPTOM: "Have you ever had this type of stomach pain before?" If Yes, ask: "When was the last time?" and "What happened that time?"      Years ago---unsure what it was 8. CAUSE: "What do you think is causing the stomach  pain?"     Unknown 9. RELIEVING/AGGRAVATING FACTORS: "What makes it better or worse?" (e.g., antacids, bending or twisting motion, bowel movement)     ----- 10. OTHER SYMPTOMS: "Do you have any other symptoms?" (e.g., back pain, diarrhea, fever, urination pain, vomiting)       Vomiting 11. PREGNANCY: "Is there any chance you are pregnant?" "When was your last menstrual period?"       No  Protocols used: Abdominal Pain - [facility]

## 2024-03-16 ENCOUNTER — Other Ambulatory Visit: Payer: Self-pay | Admitting: Family Medicine

## 2024-03-16 DIAGNOSIS — E559 Vitamin D deficiency, unspecified: Secondary | ICD-10-CM

## 2024-03-19 ENCOUNTER — Other Ambulatory Visit: Payer: Self-pay | Admitting: Family Medicine

## 2024-03-19 DIAGNOSIS — E119 Type 2 diabetes mellitus without complications: Secondary | ICD-10-CM

## 2024-04-01 ENCOUNTER — Other Ambulatory Visit (HOSPITAL_COMMUNITY): Payer: Self-pay

## 2024-04-01 ENCOUNTER — Telehealth: Payer: Self-pay

## 2024-04-01 NOTE — Telephone Encounter (Signed)
 Pharmacy Patient Advocate Encounter   Received notification from CoverMyMeds that prior authorization for Ozempic  2 is required/requested.   Insurance verification completed.   The patient is insured through [employer] .   Per test claim: The current 28 day co-pay is, $242.36.  No PA needed at this time. This test claim was processed through [facility]- copay amounts may vary at other pharmacies due to pharmacy/plan contracts, or as the patient moves through the different stages of their insurance plan.

## 2024-04-11 ENCOUNTER — Encounter: Payer: Self-pay | Admitting: Family Medicine

## 2024-05-09 ENCOUNTER — Other Ambulatory Visit: Payer: Self-pay | Admitting: Family Medicine

## 2024-05-09 DIAGNOSIS — E1169 Type 2 diabetes mellitus with other specified complication: Secondary | ICD-10-CM

## 2024-05-09 DIAGNOSIS — E559 Vitamin D deficiency, unspecified: Secondary | ICD-10-CM

## 2024-05-13 ENCOUNTER — Other Ambulatory Visit: Payer: Self-pay | Admitting: Family Medicine

## 2024-05-13 DIAGNOSIS — E1169 Type 2 diabetes mellitus with other specified complication: Secondary | ICD-10-CM

## 2024-05-13 DIAGNOSIS — E559 Vitamin D deficiency, unspecified: Secondary | ICD-10-CM

## 2024-05-13 NOTE — Telephone Encounter (Signed)
 Rybelsus  not on current med list

## 2024-05-13 NOTE — Telephone Encounter (Unsigned)
 Copied from CRM [id number]. Topic: Clinical - Medication Refill >> May 13, 2024  3:04 PM Berneda FALCON wrote: Medication: RYBELSUS  14 MG TABS (Please note, this prescription is showing as discontinued. Pt does not think it is). rosuvastatin  (CRESTOR ) 5 MG tablet Vitamin D , Ergocalciferol , (DRISDOL ) 1.25 MG (50000 UNIT) CAPS capsule   Has the patient contacted their pharmacy? Yes (Agent: If no, request that the patient contact the pharmacy for the refill. If patient does not wish to contact the pharmacy document the reason why and proceed with request.) (Agent: If yes, when and what did the pharmacy advise?)  This is the patient's preferred pharmacy:  [id number] - [place], [place] - [address] [place] [address] Phone: [phone] Fax: [phone]  Is this the correct pharmacy for this prescription? Yes If no, delete pharmacy and type the correct one.   Has the prescription been filled recently? No  Is the patient out of the medication? No, almost out  Has the patient been seen for an appointment in the last year OR does the patient have an upcoming appointment? Yes  Can we respond through MyChart? Yes  Agent: Please be advised that Rx refills may take up to 3 business days. We ask that you follow-up with your pharmacy.

## 2024-05-14 ENCOUNTER — Other Ambulatory Visit: Payer: Self-pay | Admitting: Family Medicine

## 2024-05-14 MED ORDER — VITAMIN D (ERGOCALCIFEROL) 1.25 MG (50000 UNIT) PO CAPS: ORAL_CAPSULE | ORAL | 1 refills | Status: DC

## 2024-05-15 ENCOUNTER — Telehealth: Payer: Self-pay

## 2024-05-15 NOTE — Telephone Encounter (Signed)
 Copied from CRM [id number]. Topic: Clinical - Prescription Issue >> May 15, 2024 12:12 PM Abigail D wrote: Reason for CRM: Patient having issue RYBELSUS  14 MG TABS [[id number]]: it is showing as discontinued and she said that the Rybelsus  was discontinued due to her starting Ozempic , patient states that she isn't going on ozempic  due to the price. She said she would like to stay on the Rybelsus . Patient is running out, she has enough until Friday.

## 2024-05-15 NOTE — Telephone Encounter (Signed)
**Note De-identified  [place] Obfuscation** Please advise 

## 2024-05-16 ENCOUNTER — Other Ambulatory Visit: Payer: Self-pay | Admitting: Family

## 2024-05-16 MED ORDER — RYBELSUS 14 MG PO TABS: 14.0000 mg | ORAL_TABLET | Freq: Every day | ORAL | 2 refills | Status: DC

## 2024-05-27 ENCOUNTER — Ambulatory Visit: Payer: PRIVATE HEALTH INSURANCE | Admitting: Family Medicine

## 2024-06-09 ENCOUNTER — Other Ambulatory Visit: Payer: Self-pay | Admitting: Family Medicine

## 2024-06-09 DIAGNOSIS — E1169 Type 2 diabetes mellitus with other specified complication: Secondary | ICD-10-CM

## 2024-06-10 ENCOUNTER — Other Ambulatory Visit: Payer: Self-pay | Admitting: Family Medicine

## 2024-06-10 ENCOUNTER — Telehealth: Payer: Self-pay

## 2024-06-10 ENCOUNTER — Encounter: Payer: Self-pay | Admitting: Family Medicine

## 2024-06-10 ENCOUNTER — Ambulatory Visit: Payer: PRIVATE HEALTH INSURANCE | Admitting: Family Medicine

## 2024-06-10 VITALS — BP 140/88 | HR 62 | Temp 97.8°F | Resp 16 | Ht 68.0 in | Wt 212.6 lb

## 2024-06-10 DIAGNOSIS — E785 Hyperlipidemia, unspecified: Secondary | ICD-10-CM | POA: Diagnosis not present

## 2024-06-10 DIAGNOSIS — E1169 Type 2 diabetes mellitus with other specified complication: Secondary | ICD-10-CM

## 2024-06-10 DIAGNOSIS — E119 Type 2 diabetes mellitus without complications: Secondary | ICD-10-CM

## 2024-06-10 DIAGNOSIS — E7849 Other hyperlipidemia: Secondary | ICD-10-CM

## 2024-06-10 DIAGNOSIS — E1165 Type 2 diabetes mellitus with hyperglycemia: Secondary | ICD-10-CM | POA: Diagnosis not present

## 2024-06-10 DIAGNOSIS — Z794 Long term (current) use of insulin: Secondary | ICD-10-CM

## 2024-06-10 DIAGNOSIS — E559 Vitamin D deficiency, unspecified: Secondary | ICD-10-CM

## 2024-06-10 DIAGNOSIS — I10 Essential (primary) hypertension: Secondary | ICD-10-CM | POA: Diagnosis not present

## 2024-06-10 LAB — COMPREHENSIVE METABOLIC PANEL WITH GFR
ALT: 12 U/L (ref 0–35)
AST: 14 U/L (ref 0–37)
Albumin: 4.1 g/dL (ref 3.5–5.2)
Alkaline Phosphatase: 55 U/L (ref 39–117)
BUN: 23 mg/dL (ref 6–23)
CO2: 29 meq/L (ref 19–32)
Calcium: 9.3 mg/dL (ref 8.4–10.5)
Chloride: 104 meq/L (ref 96–112)
Creatinine, Ser: 0.98 mg/dL (ref 0.40–1.20)
GFR: 60.84 mL/min (ref >60.00)
Glucose, Bld: 49 mg/dL — CL (ref 70–99)
Potassium: 3.7 meq/L (ref 3.5–5.1)
Sodium: 141 meq/L (ref 135–145)
Total Bilirubin: 0.3 mg/dL (ref 0.2–1.2)
Total Protein: 6.5 g/dL (ref 6.0–8.3)

## 2024-06-10 LAB — CBC WITH DIFFERENTIAL/PLATELET
Basophils Absolute: 0 K/uL (ref 0.0–0.1)
Basophils Relative: 0.5 % (ref 0.0–3.0)
Eosinophils Absolute: 0.2 K/uL (ref 0.0–0.7)
Eosinophils Relative: 2.8 % (ref 0.0–5.0)
HCT: 39.3 % (ref 36.0–46.0)
Hemoglobin: 12.4 g/dL (ref 12.0–15.0)
Lymphocytes Relative: 42 % (ref 12.0–46.0)
Lymphs Abs: 2.3 K/uL (ref 0.7–4.0)
MCHC: 31.7 g/dL (ref 30.0–36.0)
MCV: 91.7 fl (ref 78.0–100.0)
Monocytes Absolute: 0.5 K/uL (ref 0.1–1.0)
Monocytes Relative: 8.2 % (ref 3.0–12.0)
Neutro Abs: 2.6 K/uL (ref 1.4–7.7)
Neutrophils Relative %: 46.5 % (ref 43.0–77.0)
Platelets: 233 K/uL (ref 150.0–400.0)
RBC: 4.28 Mil/uL (ref 3.87–5.11)
RDW: 14.5 % (ref 11.5–15.5)
WBC: 5.6 K/uL (ref 4.0–10.5)

## 2024-06-10 LAB — LIPID PANEL
Cholesterol: 142 mg/dL (ref 0–200)
HDL: 51 mg/dL (ref >39.00)
LDL Cholesterol: 72 mg/dL (ref 0–99)
NonHDL: 91.11
Total CHOL/HDL Ratio: 3
Triglycerides: 95 mg/dL (ref 0.0–149.0)
VLDL: 19 mg/dL (ref 0.0–40.0)

## 2024-06-10 LAB — MICROALBUMIN / CREATININE URINE RATIO
Creatinine,U: 107.2 mg/dL
Microalb Creat Ratio: 8.3 mg/g (ref 0.0–30.0)
Microalb, Ur: 0.9 mg/dL (ref 0.0–1.9)

## 2024-06-10 LAB — HEMOGLOBIN A1C: Hgb A1c MFr Bld: 7.6 % — ABNORMAL HIGH (ref 4.6–6.5)

## 2024-06-10 MED ORDER — LISINOPRIL-HYDROCHLOROTHIAZIDE 10-12.5 MG PO TABS
1.0000 | ORAL_TABLET | Freq: Every day | ORAL | 2 refills | Status: DC
Start: 2024-06-10 — End: 2024-07-24

## 2024-06-10 MED ORDER — LANTUS SOLOSTAR 100 UNIT/ML ~~LOC~~ SOPN: 20.0000 [IU] | PEN_INJECTOR | Freq: Every day | SUBCUTANEOUS | 2 refills | Status: DC

## 2024-06-10 MED ORDER — RYBELSUS 14 MG PO TABS: 14.0000 mg | ORAL_TABLET | Freq: Every day | ORAL | 2 refills | Status: DC

## 2024-06-10 MED ORDER — ROSUVASTATIN CALCIUM 5 MG PO TABS: 5.0000 mg | ORAL_TABLET | Freq: Every day | ORAL | 0 refills | Status: DC

## 2024-06-10 MED ORDER — EMPAGLIFLOZIN 25 MG PO TABS: 25.0000 mg | ORAL_TABLET | Freq: Every day | ORAL | 0 refills | Status: DC

## 2024-06-10 MED ORDER — METFORMIN HCL 1000 MG PO TABS: 1000.0000 mg | ORAL_TABLET | Freq: Two times a day (BID) | ORAL | 1 refills | Status: DC

## 2024-06-10 MED ORDER — VITAMIN D (ERGOCALCIFEROL) 1.25 MG (50000 UNIT) PO CAPS
ORAL_CAPSULE | ORAL | 1 refills | Status: DC
Start: 2024-06-10 — End: 2024-07-24

## 2024-06-10 NOTE — Assessment & Plan Note (Signed)
 Encourage heart healthy diet such as MIND or DASH diet, increase exercise, avoid trans fats, simple carbohydrates and processed foods, consider a krill or fish or flaxseed oil cap daily.

## 2024-06-10 NOTE — Progress Notes (Signed)
 Subjective:    Patient ID: Kathleen Acevedo, female    DOB: 1961/10/06, 63 y.o.   MRN: [id number]  Chief Complaint  Patient presents with   Hypertension   Hyperlipidemia   Diabetes   Follow-up    HPI Patient is in today for f/u dm, hyperlipidemia and bp.   Discussed the use of AI scribe software for clinical note transcription with the patient, who gave verbal consent to proceed.  History of Present Illness Kathleen Acevedo is a 63 year old female with diabetes who presents for a follow-up visit and to discuss transitioning to Medicare.  She is concerned about the cost of her medications, specifically Rybelsus  and Jardiance , as she will be losing the coupons she currently uses. She has reviewed two different Medicare plans but has not found a significant difference between them. She has signed up for Medicare Parts A and B and is in the process of selecting a prescription plan.  She is currently taking Lantus  at a dose of 20 units. She previously tried a 70/30 insulin  mix but found it more expensive, although she preferred its quick action. Her blood sugars are generally stable but can spike depending on her diet, particularly after eating out, though they eventually normalize.  She inquired about her kidney function due to a family history of diabetes.  She is experiencing significant stress due to personal issues, including filing a missing person report for her brother, who has been missing for 30 days after leaving a rehab facility in [place] . This situation is compounded by the emotional impact of having buried her son two years ago.    Past Medical History:  Diagnosis Date   Diabetes mellitus    Hyperlipidemia    Hypertension     Past Surgical History:  Procedure Laterality Date   ABDOMINAL HYSTERECTOMY     BREAST BIOPSY Left 09/19/2013    Fibrocystic changes and fibroadenomatoid changes    BREAST BIOPSY Right 09/11/2020   fibroadenoma with calcs   OOPHORECTOMY       Family History  Problem Relation Age of Onset   Diabetes Father    High blood pressure Father    Obesity Father    Colon cancer Neg Hx    Colon polyps Neg Hx    BRCA 1/2 Neg Hx    Breast cancer Neg Hx     Social History   Socioeconomic History   Marital status: Married    Spouse name: Parneet Glantz   Number of children: 1   Years of education: Not on file   Highest education level: Some college, no degree  Occupational History   Occupation: Occupational hygienist  Tobacco Use   Smoking status: Never   Smokeless tobacco: Never  Vaping Use   Vaping status: Never Used  Substance and Sexual Activity   Alcohol use: No    Alcohol/week: 0.0 standard drinks of alcohol   Drug use: No   Sexual activity: Yes    Partners: Male  Other Topics Concern   Not on file  Social History Narrative   Exercise --  4-5 days a week-- walking    Social Drivers of Health   Financial Resource Strain: Low Risk  (02/27/2023)   Overall Financial Resource Strain (CARDIA)    Difficulty of Paying Living Expenses: Not hard at all  Food Insecurity: No Food Insecurity (02/27/2023)   Hunger Vital Sign    Worried About Running Out of Food in the Last Year: Never true  Ran Out of Food in the Last Year: Never true  Transportation Needs: No Transportation Needs (02/27/2023)   PRAPARE - Administrator, Civil Service (Medical): No    Lack of Transportation (Non-Medical): No  Physical Activity: Sufficiently Active (02/27/2023)   Exercise Vital Sign    Days of Exercise per Week: 5 days    Minutes of Exercise per Session: 50 min  Stress: No Stress Concern Present (02/27/2023)   [employer] of Occupational Health - Occupational Stress Questionnaire    Feeling of Stress : Not at all  Social Connections: Socially Integrated (02/27/2023)   Social Connection and Isolation Panel    Frequency of Communication with Friends and Family: More than three times a week    Frequency of Social Gatherings with  Friends and Family: Twice a week    Attends Religious Services: More than 4 times per year    Active Member of [employer] or Organizations: Yes    Attends Banker Meetings: 1 to 4 times per year    Marital Status: Married  Catering manager Violence: Unknown (01/31/2022)   Received from [facility]   HITS    Physically Hurt: Not on file    Insult or Talk Down To: Not on file    Threaten Physical Harm: Not on file    Scream or Curse: Not on file    Outpatient Medications Prior to Visit  Medication Sig Dispense Refill   Continuous Glucose Sensor (FREESTYLE LIBRE 3 SENSOR) MISC Place 1 sensor on the skin every 14 days. Use to check glucose continuously 2 each 5   insulin  isophane & regular human KwikPen (NOVOLIN  70/30 KWIKPEN) (70-30) 100 UNIT/ML KwikPen 10u bid 30 min prior to eating 15 mL 11   empagliflozin  (JARDIANCE ) 25 MG TABS tablet Take 1 tablet (25 mg total) by mouth daily. APPT FOR FURTHER REFILLS 90 tablet 0   LANTUS  SOLOSTAR 100 UNIT/ML Solostar Pen SMARTSIG:20 Unit(s) SUB-Q Daily     lisinopril -hydrochlorothiazide  (ZESTORETIC ) 10-12.5 MG tablet Take 1 tablet by mouth daily. 90 tablet 2   metFORMIN  (GLUCOPHAGE ) 1000 MG tablet Take 1 tablet (1,000 mg total) by mouth 2 (two) times daily with a meal. 180 tablet 1   rosuvastatin  (CRESTOR ) 5 MG tablet Take 1 tablet (5 mg total) by mouth daily. Needs appt 30 tablet 0   Semaglutide  (RYBELSUS ) 14 MG TABS Take 1 tablet (14 mg total) by mouth daily. 30 tablet 2   Vitamin D , Ergocalciferol , (DRISDOL ) 1.25 MG (50000 UNIT) CAPS capsule TAKE 1 CAPSULE BY MOUTH EVERY OTHER WEEK 12 capsule 1   No facility-administered medications prior to visit.    No Known Allergies  Review of Systems  Constitutional:  Negative for chills, fever and malaise/fatigue.  HENT:  Negative for congestion and hearing loss.   Eyes:  Negative for blurred vision and discharge.  Respiratory:  Negative for cough, sputum production and shortness of breath.    Cardiovascular:  Negative for chest pain, palpitations and leg swelling.  Gastrointestinal:  Negative for abdominal pain, blood in stool, constipation, diarrhea, heartburn, nausea and vomiting.  Genitourinary:  Negative for dysuria, frequency, hematuria and urgency.  Musculoskeletal:  Negative for back pain, falls and myalgias.  Skin:  Negative for rash.  Neurological:  Negative for dizziness, sensory change, loss of consciousness, weakness and headaches.  Endo/Heme/Allergies:  Negative for environmental allergies. Does not bruise/bleed easily.  Psychiatric/Behavioral:  Negative for depression and suicidal ideas. The patient is not nervous/anxious and does not have insomnia.  Objective:    Physical Exam Vitals and nursing note reviewed.  Constitutional:      General: She is not in acute distress.    Appearance: Normal appearance. She is well-developed.  HENT:     Head: Normocephalic and atraumatic.  Eyes:     General: No scleral icterus.       Right eye: No discharge.        Left eye: No discharge.  Cardiovascular:     Rate and Rhythm: Normal rate and regular rhythm.     Heart sounds: No murmur heard. Pulmonary:     Effort: Pulmonary effort is normal. No respiratory distress.     Breath sounds: Normal breath sounds.  Musculoskeletal:        General: Normal range of motion.     Cervical back: Normal range of motion and neck supple.     Right lower leg: No edema.     Left lower leg: No edema.  Skin:    General: Skin is warm and dry.  Neurological:     Mental Status: She is alert and oriented to person, place, and time.  Psychiatric:        Mood and Affect: Mood normal.        Behavior: Behavior normal.        Thought Content: Thought content normal.        Judgment: Judgment normal.     BP (!) 140/88 (BP Location: Left Arm, Patient Position: Sitting, Cuff Size: Normal)   Pulse 62   Temp 97.8 F (36.6 C) (Oral)   Resp 16   Ht 5' 8 (1.727 m)   Wt 212 lb 9.6  oz (96.4 kg)   SpO2 99%   BMI 32.33 kg/m  Wt Readings from Last 3 Encounters:  06/10/24 212 lb 9.6 oz (96.4 kg)  08/21/23 203 lb 9.6 oz (92.4 kg)  02/27/23 206 lb 3.2 oz (93.5 kg)    Diabetic Foot Exam - Simple   No data filed    Lab Results  Component Value Date   WBC 4.4 08/21/2023   HGB 12.2 08/21/2023   HCT 38.4 08/21/2023   PLT 225.0 08/21/2023   GLUCOSE 102 (H) 08/21/2023   CHOL 122 08/21/2023   TRIG 124.0 08/21/2023   HDL 45.70 08/21/2023   LDLDIRECT 103.6 08/23/2012   LDLCALC 51 08/21/2023   ALT 11 08/21/2023   AST 13 08/21/2023   NA 140 08/21/2023   K 4.4 08/21/2023   CL 104 08/21/2023   CREATININE 1.06 08/21/2023   BUN 24 (H) 08/21/2023   CO2 29 08/21/2023   TSH 1.29 08/18/2022   HGBA1C 7.1 (H) 08/21/2023   MICROALBUR 1.2 07/02/2010    Lab Results  Component Value Date   TSH 1.29 08/18/2022   Lab Results  Component Value Date   WBC 4.4 08/21/2023   HGB 12.2 08/21/2023   HCT 38.4 08/21/2023   MCV 93.6 08/21/2023   PLT 225.0 08/21/2023   Lab Results  Component Value Date   NA 140 08/21/2023   K 4.4 08/21/2023   CO2 29 08/21/2023   GLUCOSE 102 (H) 08/21/2023   BUN 24 (H) 08/21/2023   CREATININE 1.06 08/21/2023   BILITOT 0.4 08/21/2023   ALKPHOS 43 08/21/2023   AST 13 08/21/2023   ALT 11 08/21/2023   PROT 6.2 08/21/2023   ALBUMIN 3.9 08/21/2023   CALCIUM  9.4 08/21/2023   GFR 55.69 (L) 08/21/2023   Lab Results  Component Value Date   CHOL 122  08/21/2023   Lab Results  Component Value Date   HDL 45.70 08/21/2023   Lab Results  Component Value Date   LDLCALC 51 08/21/2023   Lab Results  Component Value Date   TRIG 124.0 08/21/2023   Lab Results  Component Value Date   CHOLHDL 3 08/21/2023   Lab Results  Component Value Date   HGBA1C 7.1 (H) 08/21/2023       Assessment & Plan:  Essential hypertension -     Lipid panel -     CBC with Differential/Platelet -     Comprehensive metabolic panel with GFR -     Microalbumin  / creatinine urine ratio -     AMB Referral VBCI Care Management -     Lisinopril -hydroCHLOROthiazide ; Take 1 tablet by mouth daily.  Dispense: 90 tablet; Refill: 2  Hyperlipidemia associated with type 2 diabetes mellitus (HCC) Assessment & Plan: Encourage heart healthy diet such as MIND or DASH diet, increase exercise, avoid trans fats, simple carbohydrates and processed foods, consider a krill or fish or flaxseed oil cap daily.    Orders: -     Lipid panel -     CBC with Differential/Platelet -     Comprehensive metabolic panel with GFR -     AMB Referral VBCI Care Management -     Rosuvastatin  Calcium ; Take 1 tablet (5 mg total) by mouth daily. Needs appt  Dispense: 30 tablet; Refill: 0  Vitamin D  deficiency -     Vitamin D  (Ergocalciferol ); TAKE 1 CAPSULE BY MOUTH EVERY OTHER WEEK  Dispense: 12 capsule; Refill: 1  Uncontrolled type 2 diabetes mellitus with hyperglycemia, with long-term current use of insulin  (HCC) Assessment & Plan: Con't rybelsus , jardiance  and lantus  Check labs   Orders: -     Hemoglobin A1c -     Microalbumin / creatinine urine ratio -     AMB Referral VBCI Care Management  Type 2 diabetes mellitus without complication, without long-term current use of insulin  (HCC) -     Empagliflozin ; Take 1 tablet (25 mg total) by mouth daily. APPT FOR FURTHER REFILLS  Dispense: 90 tablet; Refill: 0 -     Lantus  SoloStar; Inject 20 Units into the skin at bedtime.  Dispense: 15 mL; Refill: 2 -     metFORMIN  HCl; Take 1 tablet (1,000 mg total) by mouth 2 (two) times daily with a meal.  Dispense: 180 tablet; Refill: 1 -     Rybelsus ; Take 1 tablet (14 mg total) by mouth daily.  Dispense: 30 tablet; Refill: 2  Primary hypertension Assessment & Plan: Well controlled, no changes to meds. Encouraged heart healthy diet such as the DASH diet and exercise as tolerated.     Other hyperlipidemia Assessment & Plan: Encourage heart healthy diet such as MIND or DASH diet, increase  exercise, avoid trans fats, simple carbohydrates and processed foods, consider a krill or fish or flaxseed oil cap daily.      Assessment and Plan Assessment & Plan Type 2 diabetes mellitus   Type 2 diabetes mellitus is managed with Lantus , Rebellious, and Jardiance . She is transitioning to Medicare and is concerned about medication costs and coverage. Blood sugars are generally stable but can spike with dietary intake. Prefers 70/30 insulin  for its quick action but uses Lantus  due to cost. Discussed switching to Ozempic  to discontinue Rebellious. Have pharmacist Tammy assist with medication management and finding cost-effective options under Medicare. Check all labs today to monitor diabetes control. Refill Lantus  as needed.  Chronic kidney disease, stage 2 (mild)   Chronic kidney disease stage 2 with a GFR of approximately 56, slightly below the desired level of 60. The creatinine level is normal. The GFR has shown some fluctuation but remains well-managed. Monitor kidney function with regular blood work.   Anais Koenen R [name], DO

## 2024-06-10 NOTE — Telephone Encounter (Signed)
 CRITICAL VALUE STICKER  CRITICAL VALUE: Glucose- 48

## 2024-06-10 NOTE — Assessment & Plan Note (Signed)
 Con't rybelsus , jardiance  and lantus  Check labs

## 2024-06-10 NOTE — Assessment & Plan Note (Signed)
 Well controlled, no changes to meds. Encouraged heart healthy diet such as the DASH diet and exercise as tolerated.

## 2024-06-11 ENCOUNTER — Encounter: Payer: Self-pay | Admitting: Family Medicine

## 2024-06-11 ENCOUNTER — Ambulatory Visit: Payer: Self-pay | Admitting: Family Medicine

## 2024-06-11 NOTE — Telephone Encounter (Unsigned)
 Copied from CRM #[id number]. Topic: General - Other >> Jun 11, 2024  4:45 PM Delon DASEN wrote: Reason for CRM: Patient needs a form filled out for Medicare- please [phone] need it ASAP

## 2024-06-11 NOTE — Telephone Encounter (Signed)
 Noted

## 2024-06-12 ENCOUNTER — Telehealth: Payer: Self-pay | Admitting: Family Medicine

## 2024-06-12 NOTE — Telephone Encounter (Signed)
 Pt notiifed via mychart forms ready for pick up

## 2024-06-12 NOTE — Telephone Encounter (Signed)
 Pt dropped off papers to be filled out by pcp. Placed papers in pcps box. Please call pt when papers are ready to be picked up.

## 2024-06-25 ENCOUNTER — Encounter: Payer: Self-pay | Admitting: Family Medicine

## 2024-07-02 ENCOUNTER — Telehealth: Payer: Self-pay

## 2024-07-02 NOTE — Progress Notes (Signed)
 Complex Care Management Note  Care Guide Note 07/02/2024 Name: TINA GRUNER MRN: [id number] DOB: 06/07/1961  AMSI GRIMLEY is a 63 y.o. year old female who sees [name], [name], DO for primary care. I reached out to Romero FORBES Bunde by phone today to offer complex care management services.  Ms. Miron was given information about Complex Care Management services today including:   The Complex Care Management services include support from the care team which includes your Nurse Care Manager, Clinical Social Worker, or Pharmacist.  The Complex Care Management team is here to help remove barriers to the health concerns and goals most important to you. Complex Care Management services are voluntary, and the patient may decline or stop services at any time by request to their care team member.   Complex Care Management Consent Status: Patient agreed to services and verbal consent obtained.   Follow up plan:  Face to Face appointment with complex care management team member scheduled for:  07/08/24 at 3:00 p.m.   Encounter Outcome:  Patient Scheduled  [facility]  [facility], [facility] VBCI Assistant Direct Dial: [phone]  Fax: [phone]

## 2024-07-04 ENCOUNTER — Ambulatory Visit: Payer: PRIVATE HEALTH INSURANCE | Admitting: Family Medicine

## 2024-07-07 ENCOUNTER — Other Ambulatory Visit: Payer: Self-pay | Admitting: Family Medicine

## 2024-07-07 DIAGNOSIS — E1169 Type 2 diabetes mellitus with other specified complication: Secondary | ICD-10-CM

## 2024-07-08 ENCOUNTER — Telehealth: Payer: Self-pay | Admitting: Family Medicine

## 2024-07-08 ENCOUNTER — Ambulatory Visit: Payer: PRIVATE HEALTH INSURANCE

## 2024-07-08 ENCOUNTER — Ambulatory Visit (INDEPENDENT_AMBULATORY_CARE_PROVIDER_SITE_OTHER): Payer: PRIVATE HEALTH INSURANCE | Admitting: Pharmacist

## 2024-07-08 DIAGNOSIS — I10 Essential (primary) hypertension: Secondary | ICD-10-CM

## 2024-07-08 DIAGNOSIS — E119 Type 2 diabetes mellitus without complications: Secondary | ICD-10-CM

## 2024-07-08 DIAGNOSIS — E1169 Type 2 diabetes mellitus with other specified complication: Secondary | ICD-10-CM

## 2024-07-08 DIAGNOSIS — E785 Hyperlipidemia, unspecified: Secondary | ICD-10-CM

## 2024-07-08 MED ORDER — FREESTYLE LIBRE 3 PLUS SENSOR MISC: 1 refills | Status: DC

## 2024-07-08 NOTE — Progress Notes (Signed)
 07/08/2024 Name: Kathleen Acevedo MRN: [id number] DOB: Jan 07, 1961  Chief Complaint  Patient presents with   Diabetes   Medication Management    Kathleen Acevedo is a 63 y.o. year old female who presented for a telephone visit.   They were referred to the pharmacist by their PCP for assistance in managing diabetes, medication access, and complex medication management.    Subjective:  Care Team: Primary Care Provider: [name], [name], DO ; Next Scheduled Visit: not currently scheduled - last visit was 06/10/2024  Medication Access/Adherence  Current Pharmacy:  [id number] - [place], [place] - [address] [place] [address] Phone: [phone] Fax: [phone]  [facility] - [place], [place] - [address] [place] [address] Phone: [phone] Fax: [phone]   Patient reports affordability concerns with their medications: Yes  Patient reports access/transportation concerns to their pharmacy: No  Patient reports adherence concerns with their medications:  No      Diabetes:  Current medications: Rybelsus  14mg  once a day; Lantus  20 units daily and Jardiance  25mg  daily  Medications tried in the past: Trulicity  0.75mg  - stopped due to cost  Current glucose readings: none to report by patient. She has not started using Continuous Glucose Monitor yet due to her previous insurance did not cover.   Patient denies hypoglycemic s/sx including no dizziness, shakiness, sweating. Patient denies hyperglycemic symptoms including no polyuria, polydipsia, polyphagia, nocturia, neuropathy, blurred vision.   Macrovascular and Microvascular Risk Reduction:  Statin? yes (rosuvastatin  5mg  daily ); ACEi/ARB? yes (Zestoretic  HCT and also has [place]) Last urinary albumin/creatinine ratio:  Lab Results  Component Value Date   MICRALBCREAT 8.3 06/10/2024   MICRALBCREAT 0.9 07/02/2010    MICRALBCREAT 4.6 07/24/2008   MICRALBCREAT 25.0 02/27/2008   MICRALBCREAT 4.4 04/20/2007   MICRALBCREAT 6.3 01/09/2007   Last eye exam:  Lab Results  Component Value Date   HMDIABEYEEXA Retinopathy (A) 02/03/2023   Last foot exam: 01/14/2022 Tobacco Use:  Tobacco Use: Low Risk  (06/10/2024)   Patient History    Smoking Tobacco Use: Never    Smokeless Tobacco Use: Never    Passive Exposure: Not on file   Hypertension:  Current medications: lisinopril -HCT  BP Readings from Last 3 Encounters:  06/10/24 (!) 140/88  08/21/23 124/68  02/27/23 138/72     Hyperlipidemia/ASCVD Risk Reduction  Current lipid lowering medications: rosuvastatin  5mg  daily     Objective:  Lab Results  Component Value Date   HGBA1C 7.6 (H) 06/10/2024    Lab Results  Component Value Date   CREATININE 0.98 06/10/2024   BUN 23 06/10/2024   NA 141 06/10/2024   K 3.7 06/10/2024   CL 104 06/10/2024   CO2 29 06/10/2024    Lab Results  Component Value Date   CHOL 142 06/10/2024   HDL 51.00 06/10/2024   LDLCALC 72 06/10/2024   LDLDIRECT 103.6 08/23/2012   TRIG 95.0 06/10/2024   CHOLHDL 3 06/10/2024    Medications Reviewed Today     Reviewed by Carla Milling, RPH-CPP (Pharmacist) on 07/08/24 at 1626  Med List Status: <None>   Medication Order Taking? Sig Documenting Provider Last Dose Status Informant  Continuous Glucose Sensor (FREESTYLE LIBRE 3 PLUS SENSOR) MISC [id number]  Change sensor every 15 days.  Patient not taking: Reported on 07/08/2024   [name] [name], DO  Active    Patient not taking:  Discontinued 07/08/24 1625 (Duplicate)   empagliflozin  (JARDIANCE ) 25 MG TABS tablet [id number] Yes Take 1 tablet (25 mg total) by mouth daily. APPT FOR FURTHER REFILLS Antonio Cyndee [name], DO  Active     Discontinued 07/08/24 1324 (No longer needed (for PRN medications))   LANTUS  SOLOSTAR 100 UNIT/ML Solostar Pen [id number] Yes Inject 20 Units into the skin at bedtime. [name],  [name], DO  Active   lisinopril -hydrochlorothiazide  (ZESTORETIC ) 10-12.5 MG tablet [id number] Yes Take 1 tablet by mouth daily. [name], [name], DO  Active   metFORMIN  (GLUCOPHAGE ) 1000 MG tablet [id number] Yes Take 1 tablet (1,000 mg total) by mouth 2 (two) times daily with a meal. Antonio Cyndee, [name], DO  Active   rosuvastatin  (CRESTOR ) 5 MG tablet [id number] Yes Take 1 tablet (5 mg total) by mouth daily. Antonio Cyndee, [name], DO  Active   Semaglutide  (RYBELSUS ) 14 MG TABS [id number] Yes Take 1 tablet (14 mg total) by mouth daily. Antonio Cyndee, Jamee R, DO  Active   Vitamin D , Ergocalciferol , (DRISDOL ) 1.25 MG (50000 UNIT) CAPS capsule [id number] Yes TAKE 1 CAPSULE BY MOUTH EVERY OTHER WEEK Antonio Cyndee, [name], DO  Active               Assessment/Plan:   Diabetes: - Currently uncontrolled; goal A1c <7%. Cardiorenal risk reduction is opportunities for improvement.. Blood pressure is at goal <130/80 (2 of the last 3 readings were at goal). LDL is at goal.  - Reviewed long term cardiovascular and renal outcomes of uncontrolled blood sugar. and Reviewed goal A1c, goal fasting, and goal 2 hour post prandial glucose. Recommended to check glucose daily - will try to get Continuous Glucose Monitor thru her new insurance / Medicare Plan.  - Recommend to start Ozempic  0.5mg  weekly (will supply sample) ., Recommend to stop Rybelsus  14mg  .Provided sample for Ozempic  #1 pen. - Patient denies personal or family history of multiple endocrine neoplasia type 2, medullary thyroid  cancer; personal history of pancreatitis or gallbladder disease., Discussed side effects of gastrointestinal upset/nausea; eating smaller meals, avoiding high-fat foods, and remaining upright after eating may reduce nausea. Discussed that overeating is a major trigger of nausea with this class of medications, as often times patients will start to feel full sooner and may need to decrease portion sizes from what they were  previously accustomed to.  - Sent in Rx for [place] 3 plus sensors to [employer]. If not covered with [facility] then will try to use DME / Part B benefits.   Follow Up Plan: 3 weeks.   Madelin Ray, PharmD Clinical Pharmacist [facility]

## 2024-07-08 NOTE — Telephone Encounter (Signed)
 Copied from CRM [id number]. Topic: General - Call Back - No Documentation >> Jul 08, 2024 11:23 AM Cherylann RAMAN wrote: Reason for CRM: Patient returning call to pharmacist. Reached out to [facility] and was informed they will return her call. Informed patient and she verbalized understanding.

## 2024-07-08 NOTE — Telephone Encounter (Signed)
 Copied from CRM [id number]. Topic: General - Other >> Jul 08, 2024 10:54 AM Carlyon D wrote: Reason for CRM: Called pt left VM in regards to 1 pm Appt miss Tammy will be in a diff. Office  from 12-1 so if pt wants to keep 1 pm appt today miss tammy can do a phone call. If pt would like in person it needs to rescheduled after 1 pm.

## 2024-07-09 ENCOUNTER — Telehealth (HOSPITAL_BASED_OUTPATIENT_CLINIC_OR_DEPARTMENT_OTHER): Payer: Self-pay | Admitting: Pharmacist

## 2024-07-09 NOTE — Telephone Encounter (Signed)
 Called [employer] to see if they were able to process her Continuous Glucose Monitor - Libre 3 plus sensor. [facility] did fill and cost to patient will be $0.  [facility] just needs pt to call and verify her shipping address.  Provided patient with [employer] number [phone].  Kathleen Acevedo will let me know when she receives shipment so I can help her place the first sensor and set up Organ app on her phone.

## 2024-07-10 ENCOUNTER — Other Ambulatory Visit (HOSPITAL_COMMUNITY): Payer: Self-pay

## 2024-07-10 ENCOUNTER — Telehealth: Payer: Self-pay

## 2024-07-10 NOTE — Telephone Encounter (Signed)
 Pharmacy Patient Advocate Encounter   Received notification from Onbase that prior authorization for Rybelsus  14MG  tablets  is required/requested.   Insurance verification completed.   The patient is insured through [employer] .   Per test claim: PA required; PA submitted to above mentioned insurance via Latent Key/confirmation #/EOC [id number] Status is pending

## 2024-07-11 ENCOUNTER — Other Ambulatory Visit (HOSPITAL_COMMUNITY): Payer: Self-pay

## 2024-07-11 NOTE — Telephone Encounter (Signed)
 Pharmacy Patient Advocate Encounter  Received notification from [facility] that Prior Authorization for Rybelsus  14MG  tablets  has been APPROVED from 07/10/24 to 10/30/24. Ran test claim, Copay is $302. This test claim was processed through [facility]- copay amounts may vary at other pharmacies due to pharmacy/plan contracts, or as the patient moves through the different stages of their insurance plan.   PA #/Case ID/Reference #: [id number]

## 2024-07-17 ENCOUNTER — Other Ambulatory Visit (INDEPENDENT_AMBULATORY_CARE_PROVIDER_SITE_OTHER): Admitting: Pharmacist

## 2024-07-17 ENCOUNTER — Encounter: Payer: Self-pay | Admitting: Pharmacist

## 2024-07-17 DIAGNOSIS — E119 Type 2 diabetes mellitus without complications: Secondary | ICD-10-CM

## 2024-07-17 NOTE — Addendum Note (Signed)
 Addended by: CARLA MILLING B on: 07/17/2024 10:13 AM   Modules accepted: Level of Service

## 2024-07-17 NOTE — Progress Notes (Addendum)
 07/17/2024 Name: Kathleen Acevedo MRN: [id number] DOB: 03-13-1961  Chief Complaint  Patient presents with   Diabetes    Kathleen Acevedo is a 63 y.o. year old female who presented for an office visit.    They were referred to the pharmacist by their PCP for assistance in managing diabetes, medication access, and complex medication management.    Subjective: Patient is here today for Continuous Glucose Monitor new start and education. At our last visit sent Rx to [facility] and was covered.   Care Team: Primary Care Provider: [name], [name], DO ; Next Scheduled Visit: not currently scheduled - last visit was 06/10/2024  Medication Access/Adherence  Current Pharmacy:  [id number] - [place], [place] - [address] [place] [address] Phone: [phone] Fax: [phone]  [facility] - [place], [place] - [address] [place] [address] Phone: [phone] Fax: [phone]   Patient reports affordability concerns with their medications: Yes  Patient reports access/transportation concerns to their pharmacy: No  Patient reports adherence concerns with their medications:  No      Diabetes:  Current medications: Ozempic  0.5g weekly (has had one dose so far - previously was on Rybelsus  14mg  daily) Lantus  20 units daily and Jardiance  25mg  daily  Medications tried in the past: Trulicity  0.75mg  - stopped due to cost  Current glucose readings: none to report by patient. She has not started using Continuous Glucose Monitor yet.  Patient denies hypoglycemic s/sx including no dizziness, shakiness, sweating. Patient denies hyperglycemic symptoms including no polyuria, polydipsia, polyphagia, nocturia, neuropathy, blurred vision.  Macrovascular and Microvascular Risk Reduction:  Statin? yes (rosuvastatin  5mg  daily ); ACEi/ARB? yes (Zestoretic  HCT and also has Jardiance ) Last  urinary albumin/creatinine ratio:  Lab Results  Component Value Date   MICRALBCREAT 8.3 06/10/2024   MICRALBCREAT 0.9 07/02/2010   MICRALBCREAT 4.6 07/24/2008   MICRALBCREAT 25.0 02/27/2008   MICRALBCREAT 4.4 04/20/2007   MICRALBCREAT 6.3 01/09/2007   Last eye exam:  Lab Results  Component Value Date   HMDIABEYEEXA Retinopathy (A) 02/03/2023   Last foot exam: 01/14/2022 Tobacco Use:  Tobacco Use: Low Risk  (06/10/2024)   Patient History    Smoking Tobacco Use: Never    Smokeless Tobacco Use: Never    Passive Exposure: Not on file    Objective:  Lab Results  Component Value Date   HGBA1C 7.6 (H) 06/10/2024    Lab Results  Component Value Date   CREATININE 0.98 06/10/2024   BUN 23 06/10/2024   NA 141 06/10/2024   K 3.7 06/10/2024   CL 104 06/10/2024   CO2 29 06/10/2024    Lab Results  Component Value Date   CHOL 142 06/10/2024   HDL 51.00 06/10/2024   LDLCALC 72 06/10/2024   LDLDIRECT 103.6 08/23/2012   TRIG 95.0 06/10/2024   CHOLHDL 3 06/10/2024    Medications Reviewed Today     Reviewed by Carla Milling, RPH-CPP (Pharmacist) on 07/17/24 at 0834  Med List Status: <None>   Medication Order Taking? Sig Documenting Provider Last Dose Status Informant  Continuous Glucose Sensor (FREESTYLE LIBRE 3 PLUS SENSOR) MISC [id number] Yes Change sensor every 15 days. [name], [name], DO  Active   empagliflozin  (JARDIANCE ) 25 MG TABS tablet [id number] Yes Take 1 tablet (25 mg total) by mouth daily. APPT FOR FURTHER REFILLS [name] [name], DO  Active   LANTUS   SOLOSTAR 100 UNIT/ML Solostar Pen [id number] Yes Inject 20 Units into the skin at bedtime. [name], [name], DO  Active   lisinopril -hydrochlorothiazide  (ZESTORETIC ) 10-12.5 MG tablet [id number] Yes Take 1 tablet by mouth daily. [name], [name], DO  Active   metFORMIN  (GLUCOPHAGE ) 1000 MG tablet [id number] Yes Take 1 tablet (1,000 mg total) by mouth 2 (two) times daily with a meal. [name], [name], DO  Active   rosuvastatin  (CRESTOR ) 5 MG tablet [id number] Yes Take 1 tablet (5 mg total) by mouth daily. [name], [name], DO  Active   Semaglutide  (RYBELSUS ) 14 MG TABS [id number] Yes Take 1 tablet (14 mg total) by mouth daily. [name], Jamee R, DO  Active   Vitamin D , Ergocalciferol , (DRISDOL ) 1.25 MG (50000 UNIT) CAPS capsule [id number] Yes TAKE 1 CAPSULE BY MOUTH EVERY OTHER WEEK [name], [name], DO  Active               Assessment/Plan:   Diabetes: - Currently uncontrolled; goal A1c <7%. Cardiorenal risk reduction is optimized.. Blood pressure is at goal <130/80 (2 of the last 3 readings were at goal). LDL is at goal.  - Reviewed long term cardiovascular and renal outcomes of uncontrolled blood sugar. and Reviewed goal A1c, goal fasting, and goal 2 hour post prandial glucose. Recommended to check glucose with CGM  - Patient received the following instruction for Freestyle Libre 3 Plus Personal CGM:   - preparation of placement site - clean with alcohol and allow to dry.  Sensor is to only be place on back of upper arm.  Patient to rotate sides and site.   -care of sensor and site. Discussed using overlay patch or Tegaderm if needed.  - reminded that sensor is waterproof up to 3 feet and for 30 minutes.   - Assisted in downloading [place] app to her phone and with app / account set up.   - reviewed [place] app home screen and how to read and respond to trend arrows.   - reminded that when magnifying glass symbols shows she is to confirm BG with finger stick before making any treatment decisions. .   - pt signed up for libre view in office. Linked with PCP office.   - Recommend to continue Ozempic  0.5mg  weekly, Jardaicne 25mg  daily and metformin  1000mg  twice a day - Patient denies personal or family history of multiple endocrine neoplasia type 2, medullary thyroid  cancer; personal history of pancreatitis or gallbladder disease., Discussed side effects of gastrointestinal  upset/nausea; eating smaller meals, avoiding high-fat foods, and remaining upright after eating may reduce nausea. Discussed that overeating is a major trigger of nausea with this class of medications, as often times patients will start to feel full sooner and may need to decrease portion sizes from what they were previously accustomed to.    Follow Up Plan: 2 to 4 weeks  Madelin Ray, PharmD Clinical Pharmacist [facility]

## 2024-07-23 ENCOUNTER — Encounter: Payer: Self-pay | Admitting: Family Medicine

## 2024-07-23 DIAGNOSIS — I10 Essential (primary) hypertension: Secondary | ICD-10-CM

## 2024-07-23 DIAGNOSIS — E119 Type 2 diabetes mellitus without complications: Secondary | ICD-10-CM

## 2024-07-23 DIAGNOSIS — E1169 Type 2 diabetes mellitus with other specified complication: Secondary | ICD-10-CM

## 2024-07-23 DIAGNOSIS — E559 Vitamin D deficiency, unspecified: Secondary | ICD-10-CM

## 2024-07-24 MED ORDER — ROSUVASTATIN CALCIUM 5 MG PO TABS: 5.0000 mg | ORAL_TABLET | Freq: Every day | ORAL | 1 refills | Status: DC

## 2024-07-24 MED ORDER — EMPAGLIFLOZIN 25 MG PO TABS: 25.0000 mg | ORAL_TABLET | Freq: Every day | ORAL | 1 refills | Status: DC

## 2024-07-24 MED ORDER — LANTUS SOLOSTAR 100 UNIT/ML ~~LOC~~ SOPN: 20.0000 [IU] | PEN_INJECTOR | Freq: Every day | SUBCUTANEOUS | 1 refills | Status: DC

## 2024-07-24 MED ORDER — METFORMIN HCL 1000 MG PO TABS: 1000.0000 mg | ORAL_TABLET | Freq: Two times a day (BID) | ORAL | 1 refills | Status: DC

## 2024-07-24 MED ORDER — VITAMIN D (ERGOCALCIFEROL) 1.25 MG (50000 UNIT) PO CAPS: ORAL_CAPSULE | ORAL | 1 refills | Status: AC

## 2024-07-24 MED ORDER — RYBELSUS 14 MG PO TABS: 14.0000 mg | ORAL_TABLET | Freq: Every day | ORAL | 1 refills | Status: DC

## 2024-07-24 MED ORDER — LISINOPRIL-HYDROCHLOROTHIAZIDE 10-12.5 MG PO TABS: 1.0000 | ORAL_TABLET | Freq: Every day | ORAL | 1 refills | Status: DC

## 2024-07-26 ENCOUNTER — Telehealth: Payer: Self-pay | Admitting: Pharmacist

## 2024-07-26 NOTE — Telephone Encounter (Signed)
 Patient called to let me know that she would start using [facility] for her prescriptions. We had follow up scheduled already for 9/29 to discuss Ozempic  further. Will send in Rx depending on her blood glucose over the last few weeks since starting Ozempic .

## 2024-07-29 ENCOUNTER — Other Ambulatory Visit (INDEPENDENT_AMBULATORY_CARE_PROVIDER_SITE_OTHER): Payer: PRIVATE HEALTH INSURANCE | Admitting: Pharmacist

## 2024-07-29 DIAGNOSIS — E119 Type 2 diabetes mellitus without complications: Secondary | ICD-10-CM

## 2024-07-29 MED ORDER — OZEMPIC (0.25 OR 0.5 MG/DOSE) 2 MG/3ML ~~LOC~~ SOPN: 0.5000 mg | PEN_INJECTOR | SUBCUTANEOUS | 0 refills | Status: DC

## 2024-07-29 NOTE — Progress Notes (Signed)
 07/29/2024 Name: Kathleen Acevedo MRN: [id number] DOB: 1961/09/23  Chief Complaint  Patient presents with   Diabetes   Medication Management    Kathleen Acevedo is a 63 y.o. year old female who presented for phone visit today.   They were referred to the pharmacist by their PCP for assistance in managing diabetes, medication access, and complex medication management.    Subjective:  Care Team: Primary Care Provider: [name], [name], DO ; Next Scheduled Visit: not currently scheduled - last visit was 06/10/2024  Medication Access/Adherence  Current Pharmacy:  [facility] [place], [place] - [address] [place] [address] Phone: [phone] Fax: [phone]  [facility] - [place], [place] - [address] [place] [address] Phone: [phone] Fax: [phone]   Patient reports affordability concerns with their medications: Yes  Patient reports access/transportation concerns to their pharmacy: No  Patient reports adherence concerns with their medications:  No      Diabetes:  Current medications: Ozempic  0.25mg  weekly (previously was on Rybelsus  14mg  daily) Lantus  20 units daily and Jardiance  25mg  daily   Medications tried in the past: Trulicity  0.75mg  - stopped due to cost  Reported that she felt a little nausea when she started Ozempic  but that has gotten better. She now notices that she feel full sooner.   Using [place] 3 plus Continuous Glucose Monitor.   CGM Documentation:  Days Worn: 14 (recommend 14 days) % Time CGM is active: 87% (goal >=70%) Average Glucose: 111 mg/dL Glucose Management Indicator: 6.0% Glucose Variability: 28.0% (goal <36%) Time in Range:  - Time above range >250: 0% (typical goal: <5%) - Time above range 181-250: 3% (typical goal <20%) - Time in range 70-180 92% (typical goal >=70%) - Time below range 54-69: 4% (typical goal <4%) - Time  below range: 1% (typical goal <1%)  Blood glucose trends - patient has had more lows since starting Ozempic  but she reports when she checks with fingerstick blood glucose is usually not < 70 but just 2 or 3 times.     Patient denies hypoglycemic s/sx including no dizziness, shakiness, sweating. Patient denies hyperglycemic symptoms including no polyuria, polydipsia, polyphagia, nocturia, neuropathy, blurred vision.  Macrovascular and Microvascular Risk Reduction:  Statin? yes (rosuvastatin  5mg  daily ); ACEi/ARB? yes (Zestoretic  HCT and also has Jardiance ) Last urinary albumin/creatinine ratio:  Lab Results  Component Value Date   MICRALBCREAT 8.3 06/10/2024   MICRALBCREAT 0.9 07/02/2010   MICRALBCREAT 4.6 07/24/2008   MICRALBCREAT 25.0 02/27/2008   MICRALBCREAT 4.4 04/20/2007   MICRALBCREAT 6.3 01/09/2007   Last eye exam:  Lab Results  Component Value Date   HMDIABEYEEXA Retinopathy (A) 02/03/2023   Last foot exam: 01/14/2022 Tobacco Use:  Tobacco Use: Low Risk  (06/10/2024)   Patient History    Smoking Tobacco Use: Never    Smokeless Tobacco Use: Never    Passive Exposure: Not on file    Objective:  Lab Results  Component Value Date   HGBA1C 7.6 (H) 06/10/2024    Lab Results  Component Value Date   CREATININE 0.98 06/10/2024   BUN 23 06/10/2024   NA 141 06/10/2024   K 3.7 06/10/2024   CL 104 06/10/2024   CO2 29 06/10/2024    Lab Results  Component Value Date   CHOL 142 06/10/2024   HDL 51.00 06/10/2024   LDLCALC 72 06/10/2024   LDLDIRECT 103.6 08/23/2012  TRIG 95.0 06/10/2024   CHOLHDL 3 06/10/2024    Medications Reviewed Today     Reviewed by Carla Milling, RPH-CPP (Pharmacist) on 07/29/24 at 1342  Med List Status: <None>   Medication Order Taking? Sig Documenting Provider Last Dose Status Informant  Continuous Glucose Sensor (FREESTYLE LIBRE 3 PLUS SENSOR) MISC [id number] Yes Change sensor every 15 days. [name], [name], DO  Active    empagliflozin  (JARDIANCE ) 25 MG TABS tablet [id number] Yes Take 1 tablet (25 mg total) by mouth daily. [name], Jamee R, DO  Active   LANTUS  SOLOSTAR 100 UNIT/ML Solostar Pen [id number] Yes Inject 20 Units into the skin at bedtime. [name], [name], DO  Active   lisinopril -hydrochlorothiazide  (ZESTORETIC ) 10-12.5 MG tablet [id number] Yes Take 1 tablet by mouth daily. [name], [name], DO  Active   metFORMIN  (GLUCOPHAGE ) 1000 MG tablet [id number] Yes Take 1 tablet (1,000 mg total) by mouth 2 (two) times daily with a meal. [name], [name], DO  Active   rosuvastatin  (CRESTOR ) 5 MG tablet [id number] Yes Take 1 tablet (5 mg total) by mouth daily. [name], [name], DO  Active   Semaglutide ,0.25 or 0.5MG /DOS, (OZEMPIC , 0.25 OR 0.5 MG/DOSE,) 2 MG/3ML SOPN [id number] Yes Inject 0.5 mg into the skin once a week. [provider]  Active   Vitamin D , Ergocalciferol , (DRISDOL ) 1.25 MG (50000 UNIT) CAPS capsule [id number]  TAKE 1 CAPSULE BY MOUTH EVERY OTHER WEEK [name], [name], DO  Active               Assessment/Plan:   Diabetes: - Currently uncontrolled but improving -  goal A1c <7% - GMI for the last 14 days was 6.0%. Cardiorenal risk reduction is optimized.. Blood pressure is at goal <130/80 (2 of the last 3 readings were at goal). LDL is at goal.  - Reviewed long term cardiovascular and renal outcomes of uncontrolled blood sugar. and Reviewed goal A1c, goal fasting, and goal 2 hour post prandial glucose. Recommended to check glucose with CGM  - Recommended she lower dose of Lantus  from 20 to 15 units daily.  - Recommend to increase as planned to Ozempic  0.5mg  weekly starting with Sunday 08/04/2024 dose, Jardiance  25mg  daily and metformin  1000mg  twice a day - Patient denies personal or family history of multiple endocrine neoplasia type 2, medullary thyroid  cancer; personal history of pancreatitis or gallbladder disease., Discussed side effects of gastrointestinal  upset/nausea; eating smaller meals, avoiding high-fat foods, and remaining upright after eating may reduce nausea. Discussed that overeating is a major trigger of nausea with this class of medications, as often times patients will start to feel full sooner and may need to decrease portion sizes from what they were previously accustomed to.    Follow Up Plan: 2 to 4 weeks  Milling Carla, PharmD Clinical Pharmacist [facility]

## 2024-08-05 ENCOUNTER — Other Ambulatory Visit (HOSPITAL_COMMUNITY): Payer: Self-pay

## 2024-08-05 DIAGNOSIS — H25813 Combined forms of age-related cataract, bilateral: Secondary | ICD-10-CM | POA: Diagnosis not present

## 2024-08-05 DIAGNOSIS — Z135 Encounter for screening for eye and ear disorders: Secondary | ICD-10-CM | POA: Diagnosis not present

## 2024-08-05 DIAGNOSIS — H524 Presbyopia: Secondary | ICD-10-CM | POA: Diagnosis not present

## 2024-08-05 LAB — HM DIABETES EYE EXAM

## 2024-08-08 ENCOUNTER — Encounter: Payer: Self-pay | Admitting: Family Medicine

## 2024-08-13 ENCOUNTER — Telehealth: Payer: Self-pay | Admitting: Family Medicine

## 2024-08-13 NOTE — Telephone Encounter (Signed)
 Pt dropped off paper to be filled out by pcp. Placed paper in pcps box. Please call pt when it has been faxed and ready to be picked up.

## 2024-08-14 ENCOUNTER — Other Ambulatory Visit: Admitting: Pharmacist

## 2024-08-14 DIAGNOSIS — E119 Type 2 diabetes mellitus without complications: Secondary | ICD-10-CM

## 2024-08-14 NOTE — Telephone Encounter (Signed)
 Form was completed and given to [facility]

## 2024-08-14 NOTE — Progress Notes (Signed)
 08/14/2024 Name: Kathleen Acevedo MRN: [id number] DOB: Mar 01, 1961  Chief Complaint  Patient presents with   Diabetes   Medication Management    Kathleen Acevedo is a 63 y.o. year old female who presented for phone visit today.   They were referred to the pharmacist by their PCP for assistance in managing diabetes, medication access, and complex medication management.    Subjective:  Care Team: Primary Care Provider: [name], [name], DO ; Next Scheduled Visit: not currently scheduled - last visit was 06/10/2024  Medication Access/Adherence  Current Pharmacy:  [facility] [place], [place] - [address] [place] [address] Phone: [phone] Fax: [phone]  [facility] - [place], [place] - [address] [place] [address] Phone: [phone] Fax: [phone]   Patient reports affordability concerns with their medications: Yes  Patient reports access/transportation concerns to their pharmacy: No  Patient reports adherence concerns with their medications:  No      Diabetes:  Current medications: Ozempic  0.5mg  weekly (previously was on Rybelsus  14mg  daily) Lantus  20 units daily and Jardiance  25mg  daily   Medications tried in the past: Trulicity  0.75mg  - stopped due to cost  Reported that she felt a little nausea when she started Ozempic  but that has gotten better. She now notices that she feel full sooner.   Using [place] 3 plus Continuous Glucose Monitor.   CGM Documentation:  Days Worn: 14 (recommend 14 days) % Time CGM is active: 96% (goal >=70%) Average Glucose: 115 mg/dL Glucose Management Indicator: 6.1% Time in Range:  - Time above range >250: 0% (typical goal: <5%) - Time above range 181-250: 4% (typical goal <20%) - Time in range 70-180 90% (typical goal >=70%) - Time below range 54-69: 4% (typical goal <4%) - Time below range: 2% (typical goal  <1%)  Blood glucose trends - patient has had more lows since starting Ozempic  but she reports when she checks with fingerstick blood glucose is usually not < 70 except today she has a low and she felt symptoms that she was starting to get too low.    Patient reports occacional hypoglycemic s/sx including no dizziness, shakiness, sweating. Patient denies hyperglycemic symptoms including no polyuria, polydipsia, polyphagia, nocturia, neuropathy, blurred vision.  Macrovascular and Microvascular Risk Reduction:  Statin? yes (rosuvastatin  5mg  daily ); ACEi/ARB? yes (Zestoretic  HCT and also has Jardiance ) Last urinary albumin/creatinine ratio:  Lab Results  Component Value Date   MICRALBCREAT 8.3 06/10/2024   MICRALBCREAT 0.9 07/02/2010   MICRALBCREAT 4.6 07/24/2008   MICRALBCREAT 25.0 02/27/2008   MICRALBCREAT 4.4 04/20/2007   MICRALBCREAT 6.3 01/09/2007   Last eye exam:  Lab Results  Component Value Date   HMDIABEYEEXA No Retinopathy 08/05/2024   Last foot exam: 01/14/2022 Tobacco Use:  Tobacco Use: Low Risk  (06/10/2024)   Patient History    Smoking Tobacco Use: Never    Smokeless Tobacco Use: Never    Passive Exposure: Not on file    Objective:  Lab Results  Component Value Date   HGBA1C 7.6 (H) 06/10/2024    Lab Results  Component Value Date   CREATININE 0.98 06/10/2024   BUN 23 06/10/2024   NA 141 06/10/2024   K 3.7 06/10/2024   CL 104 06/10/2024   CO2 29 06/10/2024    Lab Results  Component Value Date   CHOL 142 06/10/2024   HDL 51.00 06/10/2024   LDLCALC  72 06/10/2024   LDLDIRECT 103.6 08/23/2012   TRIG 95.0 06/10/2024   CHOLHDL 3 06/10/2024    Medications Reviewed Today     Reviewed by Carla Milling, RPH-CPP (Pharmacist) on 08/14/24 at 1431  Med List Status: <None>   Medication Order Taking? Sig Documenting Provider Last Dose Status Informant  Continuous Glucose Sensor (FREESTYLE LIBRE 3 PLUS SENSOR) MISC [id number] Yes Change sensor every 15 days.  Antonio Cyndee Rockers R, DO  Active   empagliflozin  (JARDIANCE ) 25 MG TABS tablet [id number] Yes Take 1 tablet (25 mg total) by mouth daily. Antonio Cyndee Rockers R, DO  Active   LANTUS  SOLOSTAR 100 UNIT/ML Solostar Pen [id number] Yes Inject 20 Units into the skin at bedtime. Antonio Cyndee, [name], DO  Active   lisinopril -hydrochlorothiazide  (ZESTORETIC ) 10-12.5 MG tablet [id number] Yes Take 1 tablet by mouth daily. [name], [name], DO  Active   metFORMIN  (GLUCOPHAGE ) 1000 MG tablet [id number] Yes Take 1 tablet (1,000 mg total) by mouth 2 (two) times daily with a meal. Antonio Cyndee, Rockers SAUNDERS, DO  Active   rosuvastatin  (CRESTOR ) 5 MG tablet [id number] Yes Take 1 tablet (5 mg total) by mouth daily. [name], [name], DO  Active   Semaglutide ,0.25 or 0.5MG /DOS, (OZEMPIC , 0.25 OR 0.5 MG/DOSE,) 2 MG/3ML SOPN [id number] Yes Inject 0.5 mg into the skin once a week. Antonio Cyndee, Rockers R, DO  Active   Vitamin D , Ergocalciferol , (DRISDOL ) 1.25 MG (50000 UNIT) CAPS capsule [id number] Yes TAKE 1 CAPSULE BY MOUTH EVERY OTHER WEEK Antonio Cyndee, Rockers SAUNDERS, DO  Active               Assessment/Plan:   Diabetes: - Currently uncontrolled but improving -  goal A1c <7% - GMI for the last 14 days was 6.0%. Cardiorenal risk reduction is optimized.. Blood pressure is at goal <130/80 (2 of the last 3 readings were at goal). LDL is at goal.  - Reviewed long term cardiovascular and renal outcomes of uncontrolled blood sugar. and Reviewed goal A1c, goal fasting, and goal 2 hour post prandial glucose. Recommended to check glucose with CGM  - Recommended she lower dose of Lantus  from 20 to 15 units daily.  - Continue Ozempic  0.5mg  weekly, Jardiance  25mg  daily and metformin  1000mg  twice a day - Discussed her 2026 [facility] plan and possible changes - deductible will increase to $355. She plans to meet with her agent in the next 2 weeks and will discuss other options with [facility] or other Medicare plans.     Follow Up Plan: 4 weeks - will see PCP.  Clinical Pharmacist Practitioner will follow up in 6 to 8 weeks.   Milling Carla, PharmD Clinical Pharmacist [place] [facility]

## 2024-08-14 NOTE — Telephone Encounter (Signed)
 Copied from CRM #[id number]. Topic: General - Other >> Aug 14, 2024  1:57 PM Kathleen Acevedo wrote: Reason for CRM: Patient called in requesting a status update on some insurance paper work that she had dropped off to the clinic on 08/13/2024. Patient is requesting a call back and can be reached at [phone]. Patient is also requesting a copy of of the form being sent.

## 2024-08-15 NOTE — Telephone Encounter (Signed)
 Provider has completed her portion of the form. Patient still needs to sign and date it. Then it can be faxed to number on the form.  Patient was notified of this on 08/14/2024. Form left at the front desk 08/14/2024 in the hanging folders.

## 2024-08-18 ENCOUNTER — Other Ambulatory Visit: Payer: Self-pay | Admitting: Family Medicine

## 2024-08-19 ENCOUNTER — Encounter: Payer: Self-pay | Admitting: Pharmacist

## 2024-08-19 MED ORDER — ACCU-CHEK GUIDE ME W/DEVICE KIT: PACK | 0 refills | Status: AC

## 2024-08-19 MED ORDER — ACCU-CHEK SOFTCLIX LANCETS MISC: 2 refills | Status: DC

## 2024-08-19 MED ORDER — ACCU-CHEK GUIDE TEST VI STRP: ORAL_STRIP | 2 refills | Status: DC

## 2024-08-25 ENCOUNTER — Other Ambulatory Visit: Payer: Self-pay | Admitting: Family Medicine

## 2024-09-10 ENCOUNTER — Other Ambulatory Visit: Payer: Self-pay | Admitting: Family Medicine

## 2024-09-10 DIAGNOSIS — I10 Essential (primary) hypertension: Secondary | ICD-10-CM

## 2024-09-10 DIAGNOSIS — E119 Type 2 diabetes mellitus without complications: Secondary | ICD-10-CM

## 2024-09-16 ENCOUNTER — Other Ambulatory Visit: Payer: Self-pay | Admitting: Pharmacist

## 2024-09-16 ENCOUNTER — Encounter: Payer: Self-pay | Admitting: Pharmacist

## 2024-09-16 ENCOUNTER — Ambulatory Visit: Admitting: Family Medicine

## 2024-09-16 ENCOUNTER — Encounter: Payer: Self-pay | Admitting: Family Medicine

## 2024-09-16 ENCOUNTER — Other Ambulatory Visit (HOSPITAL_BASED_OUTPATIENT_CLINIC_OR_DEPARTMENT_OTHER): Payer: Self-pay | Admitting: Family Medicine

## 2024-09-16 VITALS — BP 136/76 | HR 68 | Temp 97.8°F | Resp 16 | Ht 68.0 in | Wt 194.6 lb

## 2024-09-16 DIAGNOSIS — E559 Vitamin D deficiency, unspecified: Secondary | ICD-10-CM

## 2024-09-16 DIAGNOSIS — E119 Type 2 diabetes mellitus without complications: Secondary | ICD-10-CM

## 2024-09-16 DIAGNOSIS — Z0000 Encounter for general adult medical examination without abnormal findings: Secondary | ICD-10-CM

## 2024-09-16 DIAGNOSIS — I10 Essential (primary) hypertension: Secondary | ICD-10-CM

## 2024-09-16 DIAGNOSIS — Z1231 Encounter for screening mammogram for malignant neoplasm of breast: Secondary | ICD-10-CM

## 2024-09-16 DIAGNOSIS — E1169 Type 2 diabetes mellitus with other specified complication: Secondary | ICD-10-CM

## 2024-09-16 DIAGNOSIS — E2839 Other primary ovarian failure: Secondary | ICD-10-CM

## 2024-09-16 LAB — COMPREHENSIVE METABOLIC PANEL WITH GFR
ALT: 9 U/L (ref 0–35)
AST: 11 U/L (ref 0–37)
Albumin: 4.2 g/dL (ref 3.5–5.2)
Alkaline Phosphatase: 48 U/L (ref 39–117)
BUN: 20 mg/dL (ref 6–23)
CO2: 28 meq/L (ref 19–32)
Calcium: 9.7 mg/dL (ref 8.4–10.5)
Chloride: 100 meq/L (ref 96–112)
Creatinine, Ser: 0.99 mg/dL (ref 0.40–1.20)
GFR: 59.99 mL/min — ABNORMAL LOW (ref >60.00)
Glucose, Bld: 77 mg/dL (ref 70–99)
Potassium: 4 meq/L (ref 3.5–5.1)
Sodium: 137 meq/L (ref 135–145)
Total Bilirubin: 0.4 mg/dL (ref 0.2–1.2)
Total Protein: 6.5 g/dL (ref 6.0–8.3)

## 2024-09-16 LAB — CBC WITH DIFFERENTIAL/PLATELET
Basophils Absolute: 0 K/uL (ref 0.0–0.1)
Basophils Relative: 0.6 % (ref 0.0–3.0)
Eosinophils Absolute: 0.1 K/uL (ref 0.0–0.7)
Eosinophils Relative: 2.7 % (ref 0.0–5.0)
HCT: 37.9 % (ref 36.0–46.0)
Hemoglobin: 12.5 g/dL (ref 12.0–15.0)
Lymphocytes Relative: 37.2 % (ref 12.0–46.0)
Lymphs Abs: 1.6 K/uL (ref 0.7–4.0)
MCHC: 33 g/dL (ref 30.0–36.0)
MCV: 90.2 fl (ref 78.0–100.0)
Monocytes Absolute: 0.4 K/uL (ref 0.1–1.0)
Monocytes Relative: 8.2 % (ref 3.0–12.0)
Neutro Abs: 2.2 K/uL (ref 1.4–7.7)
Neutrophils Relative %: 51.3 % (ref 43.0–77.0)
Platelets: 249 K/uL (ref 150.0–400.0)
RBC: 4.2 Mil/uL (ref 3.87–5.11)
RDW: 14.8 % (ref 11.5–15.5)
WBC: 4.3 K/uL (ref 4.0–10.5)

## 2024-09-16 LAB — MICROALBUMIN / CREATININE URINE RATIO
Creatinine,U: 95.7 mg/dL
Microalb Creat Ratio: UNDETERMINED mg/g (ref 0.0–30.0)
Microalb, Ur: <0.7 mg/dL

## 2024-09-16 LAB — LIPID PANEL
Cholesterol: 147 mg/dL (ref 0–200)
HDL: 48.5 mg/dL (ref >39.00)
LDL Cholesterol: 74 mg/dL (ref 0–99)
NonHDL: 98.41
Total CHOL/HDL Ratio: 3
Triglycerides: 123 mg/dL (ref 0.0–149.0)
VLDL: 24.6 mg/dL (ref 0.0–40.0)

## 2024-09-16 LAB — VITAMIN D 25 HYDROXY (VIT D DEFICIENCY, FRACTURES): VITD: 50.43 ng/mL (ref 30.00–100.00)

## 2024-09-16 LAB — HEMOGLOBIN A1C: Hgb A1c MFr Bld: 6.9 % — ABNORMAL HIGH (ref 4.6–6.5)

## 2024-09-16 MED ORDER — OZEMPIC (0.25 OR 0.5 MG/DOSE) 2 MG/3ML ~~LOC~~ SOPN: 0.5000 mg | PEN_INJECTOR | SUBCUTANEOUS | 1 refills | Status: DC

## 2024-09-16 MED ORDER — EMPAGLIFLOZIN 25 MG PO TABS: 25.0000 mg | ORAL_TABLET | Freq: Every day | ORAL | 1 refills | Status: AC

## 2024-09-16 MED ORDER — FREESTYLE LIBRE 3 PLUS SENSOR MISC: Status: DC

## 2024-09-16 NOTE — Progress Notes (Signed)
 Chief Complaint  Patient presents with   Diabetes   Follow-up     Subjective:   STEPHONIE WILCOXEN is a 63 y.o. female who presents for a Welcome to Medicare Exam.   Allergies (verified) Patient has no known allergies.   History: Past Medical History:  Diagnosis Date   Diabetes mellitus    Hyperlipidemia    Hypertension    Past Surgical History:  Procedure Laterality Date   ABDOMINAL HYSTERECTOMY     BREAST BIOPSY Left 09/19/2013    Fibrocystic changes and fibroadenomatoid changes    BREAST BIOPSY Right 09/11/2020   fibroadenoma with calcs   OOPHORECTOMY     Family History  Problem Relation Age of Onset   Diabetes Father    High blood pressure Father    Obesity Father    Colon cancer Neg Hx    Colon polyps Neg Hx    BRCA 1/2 Neg Hx    Breast cancer Neg Hx    Social History   Occupational History   Occupation: occupational hygienist  Tobacco Use   Smoking status: Never   Smokeless tobacco: Never  Vaping Use   Vaping status: Never Used  Substance and Sexual Activity   Alcohol use: No    Alcohol/week: 0.0 standard drinks of alcohol   Drug use: No   Sexual activity: Yes    Partners: Male   Tobacco Counseling Counseling given: Not Answered  SDOH Screenings   Food Insecurity: No Food Insecurity (09/15/2024)  Housing: Low Risk  (09/15/2024)  Transportation Needs: No Transportation Needs (09/15/2024)  Alcohol Screen: Low Risk  (09/15/2024)  Depression (PHQ2-9): Low Risk  (09/16/2024)  Financial Resource Strain: Low Risk  (09/15/2024)  Physical Activity: Sufficiently Active (09/15/2024)  Social Connections: Socially Integrated (09/15/2024)  Stress: No Stress Concern Present (09/15/2024)  Tobacco Use: Low Risk  (09/16/2024)   See flowsheets for full screening details  Depression Screen PHQ 2 & 9 Depression Scale- Over the past 2 weeks, how often have you been bothered by any of the following problems? Little interest or pleasure in doing things: 0 Feeling  down, depressed, or hopeless (PHQ Adolescent also includes...irritable): 0 PHQ-2 Total Score: 0 Trouble falling or staying asleep, or sleeping too much: 0 Feeling tired or having little energy: 0 Poor appetite or overeating (PHQ Adolescent also includes...weight loss): 0 Feeling bad about yourself - or that you are a failure or have let yourself or your family down: 0 Trouble concentrating on things, such as reading the newspaper or watching television (PHQ Adolescent also includes...like school work): 0 Moving or speaking so slowly that other people could have noticed. Or the opposite - being so fidgety or restless that you have been moving around a lot more than usual: 0 Thoughts that you would be better off dead, or of hurting yourself in some way: 0 PHQ-9 Total Score: 0 If you checked off any problems, how difficult have these problems made it for you to do your work, take care of things at home, or get along with other people?: Not difficult at all      Goals Addressed   None    Fall Screening Falls in the past year?: 0 Number of falls in past year: 0 Was there an injury with Fall?: 0 Fall Risk Category Calculator: 0 Patient Fall Risk Level: Low Fall Risk  Fall Risk Fall risk Follow up: Falls evaluation completed  Advance Directives (For Healthcare) Does Patient Have a Medical Advance Directive?: Yes Does patient want  to make changes to medical advance directive?: No - Patient declined Type of Advance Directive: Healthcare Power of [place]; Living will Copy of Healthcare Power of Attorney in Chart?: No - copy requested Copy of Living Will in Chart?: No - copy requested         Objective:    Today's Vitals   09/16/24 0841  BP: 136/76  Pulse: 68  Resp: 16  Temp: 97.8 F (36.6 C)  TempSrc: Oral  SpO2: 99%  Weight: 194 lb 9.6 oz (88.3 kg)  Height: 5' 8 (1.727 m)   Body mass index is 29.59 kg/m.   Physical Exam Vitals and nursing note reviewed.   Constitutional:      General: She is not in acute distress.    Appearance: Normal appearance. She is well-developed.  HENT:     Head: Normocephalic and atraumatic.     Right Ear: Tympanic membrane, ear canal and external ear normal. There is no impacted cerumen.     Left Ear: Tympanic membrane, ear canal and external ear normal. There is no impacted cerumen.     Nose: Nose normal.     Mouth/Throat:     Mouth: Mucous membranes are moist.     Pharynx: Oropharynx is clear. No oropharyngeal exudate or posterior oropharyngeal erythema.  Eyes:     General: No scleral icterus.       Right eye: No discharge.        Left eye: No discharge.     Conjunctiva/sclera: Conjunctivae normal.     Pupils: Pupils are equal, round, and reactive to light.  Neck:     Thyroid : No thyromegaly or thyroid  tenderness.     Vascular: No JVD.  Cardiovascular:     Rate and Rhythm: Normal rate and regular rhythm.     Heart sounds: Normal heart sounds. No murmur heard. Pulmonary:     Effort: Pulmonary effort is normal. No respiratory distress.     Breath sounds: Normal breath sounds.  Abdominal:     General: Bowel sounds are normal. There is no distension.     Palpations: Abdomen is soft. There is no mass.     Tenderness: There is no abdominal tenderness. There is no guarding or rebound.  Musculoskeletal:        General: Normal range of motion.     Cervical back: Normal range of motion and neck supple.     Right lower leg: No edema.     Left lower leg: No edema.  Lymphadenopathy:     Cervical: No cervical adenopathy.  Skin:    General: Skin is warm and dry.     Findings: No erythema or rash.  Neurological:     Mental Status: She is alert and oriented to person, place, and time.     Cranial Nerves: No cranial nerve deficit.     Deep Tendon Reflexes: Reflexes are normal and symmetric.  Psychiatric:        Mood and Affect: Mood normal.        Behavior: Behavior normal.        Thought Content: Thought  content normal.        Judgment: Judgment normal.      Current Medications (verified) Outpatient Encounter Medications as of 09/16/2024  Medication Sig   Accu-Chek Softclix Lancets lancets Use to verify blood glucose when sensor indicates.   Blood Glucose Monitoring Suppl (ACCU-CHEK GUIDE ME) w/Device KIT Use to confirm blood glucose when sensor indicates and if blood glucose is changing  quickyl   Continuous Glucose Sensor (FREESTYLE LIBRE 3 PLUS SENSOR) MISC CHANGE SENSOR EVERY 15 DAYS   glucose blood (ACCU-CHEK GUIDE TEST) test strip Use to verify blood glucose when sensor indicates.   LANTUS  SOLOSTAR 100 UNIT/ML Solostar Pen Inject 15-20 Units into the skin at bedtime.   lisinopril -hydrochlorothiazide  (ZESTORETIC ) 10-12.5 MG tablet TAKE 1 TABLET BY MOUTH DAILY   metFORMIN  (GLUCOPHAGE ) 1000 MG tablet TAKE 1 TABLET BY MOUTH TWICE  DAILY WITH MEALS   Vitamin D , Ergocalciferol , (DRISDOL ) 1.25 MG (50000 UNIT) CAPS capsule TAKE 1 CAPSULE BY MOUTH EVERY OTHER WEEK   [DISCONTINUED] empagliflozin  (JARDIANCE ) 25 MG TABS tablet Take 1 tablet (25 mg total) by mouth daily.   [DISCONTINUED] Semaglutide ,0.25 or 0.5MG /DOS, (OZEMPIC , 0.25 OR 0.5 MG/DOSE,) 2 MG/3ML SOPN Inject 0.5 mg into the skin once a week.   empagliflozin  (JARDIANCE ) 25 MG TABS tablet Take 1 tablet (25 mg total) by mouth daily.   Semaglutide ,0.25 or 0.5MG /DOS, (OZEMPIC , 0.25 OR 0.5 MG/DOSE,) 2 MG/3ML SOPN Inject 0.5 mg into the skin once a week.   [DISCONTINUED] rosuvastatin  (CRESTOR ) 5 MG tablet Take 1 tablet (5 mg total) by mouth daily.   No facility-administered encounter medications on file as of 09/16/2024.   Hearing/Vision screen Vision Screening   Right eye Left eye Both eyes  Without correction     With correction 20/40 -1 20/40 -2   Hearing Screening - Comments:: Normal whisper  Immunizations and Health Maintenance Health Maintenance  Topic Date Due   Medicare Annual Wellness (AWV)  Never done   HIV Screening  Never  done   Fecal DNA (Cologuard)  Never done   Pneumococcal Vaccine: 50+ Years (2 of 2 - PCV) 05/29/2020   Zoster Vaccines- Shingrix  (2 of 2) 10/16/2023   COVID-19 Vaccine (3 - 2025-26 season) 07/01/2024   DEXA SCAN  07/24/2024   Mammogram  12/07/2024   HEMOGLOBIN A1C  12/11/2024   Diabetic kidney evaluation - eGFR measurement  06/10/2025   Diabetic kidney evaluation - Urine ACR  06/10/2025   OPHTHALMOLOGY EXAM  08/05/2025   FOOT EXAM  09/16/2025   DTaP/Tdap/Td (3 - Td or Tdap) 01/15/2032   Influenza Vaccine  Completed   Hepatitis C Screening  Completed   Hepatitis B Vaccines 19-59 Average Risk  Aged Out   Meningococcal B Vaccine  Aged Out   Colonoscopy  Discontinued    EKG: normal EKG, normal sinus rhythm, unchanged from previous tracings     Assessment/Plan:  This is a routine wellness examination for Kathleen Acevedo.  Patient Care Team: [name], [name], DO as PCP - General (Family Medicine) Dial, Dekarlos M, DPM as Referring Physician (Podiatry) Renate, Lynwood Hussar, MD as Referring Physician (Ophthalmology)  I have personally reviewed and noted the following in the patient's chart:   Medical and social history Use of alcohol, tobacco or illicit drugs  Current medications and supplements including opioid prescriptions. Functional ability and status Nutritional status Physical activity Advanced directives List of other physicians Hospitalizations, surgeries, and ER visits in previous 12 months Vitals Screenings to include cognitive, depression, and falls Referrals and appointments  Orders Placed This Encounter  Procedures   DG Bone Density    Standing Status:   Future    Expiration Date:   09/16/2025    Reason for Exam (SYMPTOM  OR DIAGNOSIS REQUIRED):   estrogen def    Preferred imaging location?:   [facility]    Release to patient:   Immediate   Lipid panel   CBC  with Differential/Platelet   Comprehensive metabolic panel with GFR   Hemoglobin A1c    Microalbumin / creatinine urine ratio   VITAMIN D  25 Hydroxy (Vit-D Deficiency, Fractures)   EKG 12-Lead   In addition, I have reviewed and discussed with patient certain preventive protocols, quality metrics, and best practice recommendations. A written personalized care plan for preventive services as well as general preventive health recommendations were provided to patient.  Assessment and Plan Assessment & Plan Adult Wellness Visit   She is in her first year on Medicare with no memory issues, falls, or depression. Vision is 20/40 in both eyes, likely due to cataract. She exercises regularly with walking and a gym membership. There are no changes in family history, and healthcare directives are in place. A mammogram and bone density scan are scheduled. An EKG was performed, labs including A1c were conducted, and the wellness check was completed.  Type 2 diabetes mellitus   Her diabetes is managed with Ozempic  0.5 mg and Jardiance . Blood sugar levels are well-controlled, with a recent morning reading of 96 mg/dL. She reports no dizziness and prefers Ozempic  over Rybelsus  due to better appetite control and fewer side effects. Discussed potential future availability of a new weight loss medication at a lower cost for Medicare patients. Continue Ozempic  0.5 mg and Jardiance . An A1c test was ordered.  Cataract   She has a moderate cataract in one eye, with vision at 20/40. An office visit is scheduled in January for further evaluation and management. Attend the scheduled office visit in January for cataract evaluation.   Pattye Meda R [name], DO   09/16/2024   No follow-ups on file.

## 2024-09-16 NOTE — Progress Notes (Unsigned)
 Provided patient with #1 sample for LIbre 3 pus sensor

## 2024-09-16 NOTE — Patient Instructions (Signed)
 Preventive Care 83 Years and Older, Female Preventive care refers to lifestyle choices and visits with your health care provider that can promote health and wellness. Preventive care visits are also called wellness exams. What can I expect for my preventive care visit? Counseling Your health care provider may ask you questions about your: Medical history, including: Past medical problems. Family medical history. Pregnancy and menstrual history. History of falls. Current health, including: Memory and ability to understand (cognition). Emotional well-being. Home life and relationship well-being. Sexual activity and sexual health. Lifestyle, including: Alcohol, nicotine or tobacco, and drug use. Access to firearms. Diet, exercise, and sleep habits. Work and work Astronomer. Sunscreen use. Safety issues such as seatbelt and bike helmet use. Physical exam Your health care provider will check your: Height and weight. These may be used to calculate your BMI (body mass index). BMI is a measurement that tells if you are at a healthy weight. Waist circumference. This measures the distance around your waistline. This measurement also tells if you are at a healthy weight and may help predict your risk of certain diseases, such as type 2 diabetes and high blood pressure. Heart rate and blood pressure. Body temperature. Skin for abnormal spots. What immunizations do I need?  Vaccines are usually given at various ages, according to a schedule. Your health care provider will recommend vaccines for you based on your age, medical history, and lifestyle or other factors, such as travel or where you work. What tests do I need? Screening Your health care provider may recommend screening tests for certain conditions. This may include: Lipid and cholesterol levels. Hepatitis C test. Hepatitis B test. HIV (human immunodeficiency virus) test. STI (sexually transmitted infection) testing, if you are at  risk. Lung cancer screening. Colorectal cancer screening. Diabetes screening. This is done by checking your blood sugar (glucose) after you have not eaten for a while (fasting). Mammogram. Talk with your health care provider about how often you should have regular mammograms. BRCA-related cancer screening. This may be done if you have a family history of breast, ovarian, tubal, or peritoneal cancers. Bone density scan. This is done to screen for osteoporosis. Talk with your health care provider about your test results, treatment options, and if necessary, the need for more tests. Follow these instructions at home: Eating and drinking  Eat a diet that includes fresh fruits and vegetables, whole grains, lean protein, and low-fat dairy products. Limit your intake of foods with high amounts of sugar, saturated fats, and salt. Take vitamin and mineral supplements as recommended by your health care provider. Do not drink alcohol if your health care provider tells you not to drink. If you drink alcohol: Limit how much you have to 0-1 drink a day. Know how much alcohol is in your drink. In the [place] one drink equals one 12 oz bottle of beer (355 mL), one 5 oz glass of wine (148 mL), or one 1 oz glass of hard liquor (44 mL). Lifestyle Brush your teeth every morning and night with fluoride toothpaste. Floss one time each day. Exercise for at least 30 minutes 5 or more days each week. Do not use any products that contain nicotine or tobacco. These products include cigarettes, chewing tobacco, and vaping devices, such as e-cigarettes. If you need help quitting, ask your health care provider. Do not use drugs. If you are sexually active, practice safe sex. Use a condom or other form of protection in order to prevent STIs. Take aspirin only as told by  your health care provider. Make sure that you understand how much to take and what form to take. Work with your health care provider to find out whether it  is safe and beneficial for you to take aspirin daily. Ask your health care provider if you need to take a cholesterol-lowering medicine (statin). Find healthy ways to manage stress, such as: Meditation, yoga, or listening to music. Journaling. Talking to a trusted person. Spending time with friends and family. Minimize exposure to UV radiation to reduce your risk of skin cancer. Safety Always wear your seat belt while driving or riding in a vehicle. Do not drive: If you have been drinking alcohol. Do not ride with someone who has been drinking. When you are tired or distracted. While texting. If you have been using any mind-altering substances or drugs. Wear a helmet and other protective equipment during sports activities. If you have firearms in your house, make sure you follow all gun safety procedures. What's next? Visit your health care provider once a year for an annual wellness visit. Ask your health care provider how often you should have your eyes and teeth checked. Stay up to date on all vaccines. This information is not intended to replace advice given to you by your health care provider. Make sure you discuss any questions you have with your health care provider. Document Revised: 04/14/2021 Document Reviewed: 04/14/2021 Elsevier Patient Education  2024 [employer].

## 2024-09-19 ENCOUNTER — Ambulatory Visit: Payer: Self-pay | Admitting: Family Medicine

## 2024-10-04 ENCOUNTER — Other Ambulatory Visit: Payer: Self-pay | Admitting: Family Medicine

## 2024-10-04 NOTE — Telephone Encounter (Signed)
 Rx's would not send electronically. Printed Rx's faxed to [facility].

## 2024-10-09 ENCOUNTER — Other Ambulatory Visit: Admitting: Pharmacist

## 2024-10-09 DIAGNOSIS — E119 Type 2 diabetes mellitus without complications: Secondary | ICD-10-CM

## 2024-10-09 MED ORDER — SEMAGLUTIDE (1 MG/DOSE) 4 MG/3ML ~~LOC~~ SOPN: 1.0000 mg | PEN_INJECTOR | SUBCUTANEOUS | 0 refills | Status: DC

## 2024-10-09 NOTE — Progress Notes (Signed)
 10/09/2024 Name: Kathleen Acevedo MRN: [id number] DOB: 1961/03/26  Chief Complaint  Patient presents with   Diabetes    Kathleen Acevedo is a 63 y.o. year old female who presented for phone visit today.   They were referred to the pharmacist by their PCP for assistance in managing diabetes, medication access, and complex medication management.    Subjective:  Care Team: Primary Care Provider: [name], [name], DO ; Next Scheduled Visit: 03/17/2025  Medication Access/Adherence  Current Pharmacy:  [facility] - [place], [place] - [address] [place] [address] Phone: [phone] Fax: [phone]  [id number] - [place], [place] - [address] [place] [address] Phone: [phone] Fax: [phone]   Patient reports affordability concerns with their medications: Yes  Patient reports access/transportation concerns to their pharmacy: No  Patient reports adherence concerns with their medications:  No      Diabetes:  Current medications: Ozempic  0.5mg  weekly (previously was on Rybelsus  14mg  daily) Lantus  20 units daily and Jardiance  25mg  daily   Medications tried in the past: Trulicity  0.75mg  - stopped due to cost  Reported that she felt a little nausea when she started Ozempic  but that has gotten better. She now notices that she feel full sooner.   Wt Readings from Last 3 Encounters:  09/16/24 194 lb 9.6 oz (88.3 kg)  06/10/24 212 lb 9.6 oz (96.4 kg)  08/21/23 203 lb 9.6 oz (92.4 kg)   This morning her weight was 190 lbs  Has lost 22 lbs since starting Ozempic   Using Libre 3 plus Continuous Glucose Monitor.   CGM Documentation:  Days Worn: 14 (recommend 14 days) % Time CGM is active: 97% (goal >=70%) Average Glucose: 120 mg/dL Glucose Management Indicator: 6.2% Time in Range:  - Time above range >250: 0% (typical goal: <5%) - Time above range 181-250: 5% (typical  goal <20%) - Time in range 70-180 94% (typical goal >=70%) - Time below range 54-69: 1% (typical goal <4%) - Time below range: 0% (typical goal <1%)  Blood glucose trends - patient has had fewer lows recently    Patient reports occacional hypoglycemic s/sx including no dizziness, shakiness, sweating. Patient denies hyperglycemic symptoms including no polyuria, polydipsia, polyphagia, nocturia, neuropathy, blurred vision.  Macrovascular and Microvascular Risk Reduction:  Statin? yes (rosuvastatin ) was removed from med list but patient reports she is taking rosuvastatin   ACEi/ARB? yes (Zestoretic  HCT and also has Jardiance ) Last urinary albumin/creatinine ratio:  Lab Results  Component Value Date   MICRALBCREAT Unable to calculate 09/16/2024   MICRALBCREAT 8.3 06/10/2024   MICRALBCREAT 0.9 07/02/2010   MICRALBCREAT 4.6 07/24/2008   MICRALBCREAT 25.0 02/27/2008   MICRALBCREAT 4.4 04/20/2007   MICRALBCREAT 6.3 01/09/2007   Last eye exam:  Lab Results  Component Value Date   HMDIABEYEEXA No Retinopathy 08/05/2024   Last foot exam: 09/16/2024 Tobacco Use:  Tobacco Use: Low Risk  (09/16/2024)   Patient History    Smoking Tobacco Use: Never    Smokeless Tobacco Use: Never    Passive Exposure: Not on file    Objective:  Lab Results  Component Value Date   HGBA1C 6.9 (H) 09/16/2024    Lab Results  Component Value Date   CREATININE 0.99 09/16/2024   BUN 20 09/16/2024   NA 137 09/16/2024   K 4.0 09/16/2024   CL 100 09/16/2024   CO2 28 09/16/2024  Lab Results  Component Value Date   CHOL 147 09/16/2024   HDL 48.50 09/16/2024   LDLCALC 74 09/16/2024   LDLDIRECT 103.6 08/23/2012   TRIG 123.0 09/16/2024   CHOLHDL 3 09/16/2024    Medications Reviewed Today     Reviewed by Carla Milling, RPH-CPP (Pharmacist) on 10/09/24 at 1428  Med List Status: <None>   Medication Order Taking? Sig Documenting Provider Last Dose Status Informant  Accu-Chek Softclix Lancets  lancets [id number] Yes USE TO VERIFY BLOOD GLUCOSE WHEN SENSOR INDICATES [name], [name], DO  Active   Blood Glucose Monitoring Suppl (ACCU-CHEK GUIDE ME) w/Device KIT [id number] Yes Use to confirm blood glucose when sensor indicates and if blood glucose is changing quickyl [name], [name], DO  Active   Continuous Glucose Sensor (FREESTYLE LIBRE 3 PLUS SENSOR) MISC [id number] Yes Change sensor every 15 days. Carla Milling, RPH-CPP  Active   empagliflozin  (JARDIANCE ) 25 MG TABS tablet [id number] Yes Take 1 tablet (25 mg total) by mouth daily. [name], Jamee R, DO  Active   glucose blood (ACCU-CHEK GUIDE TEST) test strip [id number] Yes USE TO VERIFY BLOOD GLUCOSE WHEN SENSOR INDICATES [name], [name], DO  Active   LANTUS  SOLOSTAR 100 UNIT/ML Solostar Pen [id number] Yes Inject 15-20 Units into the skin at bedtime. Carla Milling, RPH-CPP  Active   lisinopril -hydrochlorothiazide  (ZESTORETIC ) 10-12.5 MG tablet [id number] Yes TAKE 1 TABLET BY MOUTH DAILY [name], [name], DO  Active   metFORMIN  (GLUCOPHAGE ) 1000 MG tablet [id number] Yes TAKE 1 TABLET BY MOUTH TWICE  DAILY WITH MEALS [name], [name], DO  Active   rosuvastatin  (CRESTOR ) 5 MG tablet [id number] Yes Take 1 tablet by mouth daily. [provider]  Active   Semaglutide ,0.25 or 0.5MG /DOS, (OZEMPIC , 0.25 OR 0.5 MG/DOSE,) 2 MG/3ML SOPN [id number] Yes Inject 0.5 mg into the skin once a week. [name], Jamee R, DO  Active   Vitamin D , Ergocalciferol , (DRISDOL ) 1.25 MG (50000 UNIT) CAPS capsule [id number] Yes TAKE 1 CAPSULE BY MOUTH EVERY OTHER WEEK [name], [name], DO  Active               Assessment/Plan:   Diabetes: - Currently controlled but improving -  goal A1c <7% - GMI for the last 14 days was 6.0%. Cardiorenal risk reduction is optimized.. Blood pressure is at goal <130/80 (2 of the last 3 readings were at goal). LDL is at goal.  - Reviewed long term cardiovascular and renal outcomes of  uncontrolled blood sugar. and Reviewed goal A1c, goal fasting, and goal 2 hour post prandial glucose. Recommended to check glucose with CGM  - Recommended she lower dose of Lantus  dose by 5 more units per day - hope to eventually taper off Lantus  - Increase Ozempic  to 1mg  weekly.  - Continue Jardiance  25mg  daily and metformin  1000mg  twice a day - Discussed weight loss goals. Patient has already met initial weight loss goal of 10% of body weight. Goal weight now set at 175 lbs.    Follow Up Plan: 4 weeks  Milling Carla, PharmD Clinical Pharmacist [facility]

## 2024-10-16 ENCOUNTER — Encounter: Payer: Self-pay | Admitting: Pharmacist

## 2024-10-29 ENCOUNTER — Encounter: Payer: Self-pay | Admitting: Family Medicine

## 2024-11-11 ENCOUNTER — Other Ambulatory Visit: Admitting: Pharmacist

## 2024-11-11 ENCOUNTER — Other Ambulatory Visit: Payer: Self-pay | Admitting: Family Medicine

## 2024-11-11 NOTE — Progress Notes (Signed)
 "  11/11/2024 Name: Kathleen Acevedo MRN: [id number] DOB: [birth year]  Chief Complaint  Patient presents with   Diabetes   Medication Management    Kathleen Acevedo is a 64 y.o. year old female who presented for phone visit today.   They were referred to the pharmacist by their PCP for assistance in managing diabetes, medication access, and complex medication management.    Subjective:  Care Team: Primary Care Provider: [name], [name], DO ; Next Scheduled Visit: 03/17/2025  Medication Access/Adherence  Current Pharmacy:  [id number] - [place], [place] - [address] [place] [address] Phone: [phone] Fax: [phone]   Patient reports affordability concerns with their medications: Yes  Patient reports access/transportation concerns to their pharmacy: No  Patient reports adherence concerns with their medications:  No      Diabetes:  Current medications: Ozempic  1mg  weekly (previously was on Rybelsus  14mg  daily) Lantus  20 units daily and Jardiance  25mg  daily   Medications tried in the past: Trulicity  0.75mg  - stopped due to cost  Reported that she is having difficulty eating with 1mg  Ozempic  - having lots of nausea  Wt Readings from Last 3 Encounters:  09/16/24 194 lb 9.6 oz (88.3 kg)  06/10/24 212 lb 9.6 oz (96.4 kg)  08/21/23 203 lb 9.6 oz (92.4 kg)   This morning her weight was 183#  Using Libre 3 plus Continuous Glucose Monitor.   CGM Documentation: date 11/11/2024 Days Worn: 14 (recommend 14 days) % Time CGM is active: 97% (goal >=70%) Average Glucose: 121 mg/dL Glucose Management Indicator: 6.2% Time in Range:  - Time above range >250: 0% (typical goal: <5%) - Time above range 181-250: 6% (typical goal <20%) - Time in range 70-180 92% (typical goal >=70%) - Time below range 54-69: 2% (typical goal <4%) - Time below range: 0% (typical goal <1%)  Blood glucose trends - patient has had fewer lows  recently  Patient reports occacional hypoglycemic s/sx including no dizziness, shakiness, sweating. Patient denies hyperglycemic symptoms including no polyuria, polydipsia, polyphagia, nocturia, neuropathy, blurred vision.  Patient is working out 2 or 3 times per week - aerobics and light training  Macrovascular and Microvascular Risk Reduction:  Statin? yes (rosuvastatin ) was removed from med list but patient reports she is taking rosuvastatin   ACEi/ARB? yes (Zestoretic  HCT and also has Jardiance ) Last urinary albumin/creatinine ratio:  Lab Results  Component Value Date   MICRALBCREAT Unable to calculate 09/16/2024   MICRALBCREAT 8.3 06/10/2024   MICRALBCREAT 0.9 07/02/2010   MICRALBCREAT 4.6 07/24/2008   MICRALBCREAT 25.0 02/27/2008   MICRALBCREAT 4.4 04/20/2007   MICRALBCREAT 6.3 01/09/2007   Last eye exam:  Lab Results  Component Value Date   HMDIABEYEEXA No Retinopathy 08/05/2024   Last foot exam: 09/16/2024 Tobacco Use:  Tobacco Use: Low Risk (10/29/2024)   Received from [facility]   Patient History    Smoking Tobacco Use: Never    Smokeless Tobacco Use: Never    Passive Exposure: Never    Objective:  Lab Results  Component Value Date   HGBA1C 6.9 (H) 09/16/2024    Lab Results  Component Value Date   CREATININE 0.99 09/16/2024   BUN 20 09/16/2024   NA 137 09/16/2024   K 4.0 09/16/2024   CL 100 09/16/2024   CO2 28 09/16/2024    Lab Results  Component Value Date   CHOL 147 09/16/2024   HDL 48.50 09/16/2024   LDLCALC 74 09/16/2024  LDLDIRECT 103.6 08/23/2012   TRIG 123.0 09/16/2024   CHOLHDL 3 09/16/2024    Medications Reviewed Today     Reviewed by Carla Milling, RPH-CPP (Pharmacist) on 11/11/24 at 1448  Med List Status: <None>   Medication Order Taking? Sig Documenting Provider Last Dose Status Informant  Accu-Chek Softclix Lancets lancets [id number] Yes USE TO VERIFY BLOOD GLUCOSE WHEN SENSOR INDICATES [name], [name], DO  Active    Blood Glucose Monitoring Suppl (ACCU-CHEK GUIDE ME) w/Device KIT [id number] Yes Use to confirm blood glucose when sensor indicates and if blood glucose is changing quickyl [name], [name], DO  Active   Continuous Glucose Sensor (FREESTYLE LIBRE 3 PLUS SENSOR) MISC [id number] Yes Change sensor every 15 days. Carla Milling, RPH-CPP  Active   empagliflozin  (JARDIANCE ) 25 MG TABS tablet [id number] Yes Take 1 tablet (25 mg total) by mouth daily. [name], Jamee R, DO  Active   glucose blood (ACCU-CHEK GUIDE TEST) test strip [id number] Yes USE TO VERIFY BLOOD GLUCOSE WHEN SENSOR INDICATES [name], [name], DO  Active   LANTUS  SOLOSTAR 100 UNIT/ML Solostar Pen [id number] Yes Inject 15-20 Units into the skin at bedtime. Carla Milling, RPH-CPP  Active   lisinopril -hydrochlorothiazide  (ZESTORETIC ) 10-12.5 MG tablet [id number] Yes TAKE 1 TABLET BY MOUTH DAILY [name], [name], DO  Active   metFORMIN  (GLUCOPHAGE ) 1000 MG tablet [id number] Yes TAKE 1 TABLET BY MOUTH TWICE  DAILY WITH MEALS [name], [name], DO  Active   rosuvastatin  (CRESTOR ) 5 MG tablet [id number] Yes Take 1 tablet by mouth daily. [provider]  Active   Semaglutide , 1 MG/DOSE, 4 MG/3ML SOPN [id number] Yes Inject 1 mg as directed once a week. [name], Jamee R, DO  Active   Vitamin D , Ergocalciferol , (DRISDOL ) 1.25 MG (50000 UNIT) CAPS capsule [id number] Yes TAKE 1 CAPSULE BY MOUTH EVERY OTHER WEEK [name], [name], DO  Active               Assessment/Plan:   Diabetes: - Currently controlled but improving -  goal A1c <7% - GMI for the last 14 days was 6.2%. Cardiorenal risk reduction is optimized.. Blood pressure is at goal <130/80 (2 of the last 3 readings were at goal). LDL is at goal.  - Reviewed long term cardiovascular and renal outcomes of uncontrolled blood sugar. and Reviewed goal A1c, goal fasting, and goal 2 hour post prandial glucose. Recommended to check glucose with CGM  - Recommended she  lower dose of Lantus  dose by 5 more units per day - hope to eventually taper off Lantus  - Decreased Ozempic  back to 0.5mg  weekly due to nausea.  - Continue Jardiance  25mg  daily and metformin  1000mg  twice a day - Discussed weight loss goals. Patient has already met initial weight loss goal of 10% of body weight. Goal weight now set at 175 lbs.   - Discussed 2026 medications cost. She has $355 deductible. Monthly cost of Ozempic  22% of cost  -estimate between $150 to $200 /monthJardiance  cost will be $135 / 90 days.    Follow Up Plan:  2 weeks  Milling Carla, PharmD Clinical Pharmacist [facility] [place]    "

## 2024-11-27 ENCOUNTER — Other Ambulatory Visit: Admitting: Pharmacist

## 2024-11-27 VITALS — Wt 180.0 lb

## 2024-11-27 DIAGNOSIS — E119 Type 2 diabetes mellitus without complications: Secondary | ICD-10-CM

## 2024-11-27 NOTE — Progress Notes (Signed)
 "  11/27/2024 Name: Kathleen Acevedo MRN: [id number] DOB: 11/21/1960  Chief Complaint  Patient presents with   Diabetes   Medication Management    Kathleen Acevedo is a 64 y.o. year old female who presented for phone visit today.   They were referred to the pharmacist by their PCP for assistance in managing diabetes, medication access, and complex medication management.    Subjective:  Care Team: Primary Care Provider: [name], [name], DO ; Next Scheduled Visit: 03/17/2025  Medication Access/Adherence  Current Pharmacy:  [id number] - [place], [place] - [address] [place] [address] Phone: [phone] Fax: [phone]  [facility] - [place], [place] - [address] [place] [address] Phone: [phone] Fax: [phone]   Patient reports affordability concerns with their medications: Yes  Patient reports access/transportation concerns to their pharmacy: No  Patient reports adherence concerns with their medications:  No      Diabetes:  Current medications: Ozempic  0.5 mg weekly (previously was on Rybelsus  14mg  daily) Lantus  15units daily and Jardiance  25mg  daily   Medications tried in the past: Trulicity  0.75mg  - stopped due to cost  At last visit we lowered dose of Ozempic  from 1mg  weekly to 0.5mg  weekly because patient has having nausea. She report nausea has improved. She has continue to see good blood glucose results, weight is down to 180 lbs today.   Wt Readings from Last 3 Encounters:  09/16/24 194 lb 9.6 oz (88.3 kg)  06/10/24 212 lb 9.6 oz (96.4 kg)  08/21/23 203 lb 9.6 oz (92.4 kg)    Using Libre 3 plus Continuous Glucose Monitor.   CGM Documentation: date 11/27/2024 Days Worn: 14 (recommend 14 days) % Time CGM is active: 95% (goal >=70%) Average Glucose: 120 mg/dL Glucose Management Indicator: 6.2% Time in Range:  - Time above range >250: 0% (typical  goal: <5%) - Time above range 181-250: 7% (typical goal <20%) - Time in range 70-180 90% (typical goal >=70%) - Time below range 54-69: 3% (typical goal <4%) - Time below range: 0% (typical goal <1%)  Blood glucose trends - lows overnight - Patient states she checks with fingerstick when alerted of lows with Continuous Glucose Monitor. She states most of the time her blood glucose in actually in the 90s when she checks.  Patient reports occacional hypoglycemic s/sx including no dizziness, shakiness, sweating. Patient denies hyperglycemic symptoms including no polyuria, polydipsia, polyphagia, nocturia, neuropathy, blurred vision.  Patient is working out 2 or 3 times per week - aerobics and light training  Macrovascular and Microvascular Risk Reduction:  Statin? yes (rosuvastatin ) due to refill - patient has already requested RF from [facility] ACEi/ARB? yes (Zestoretic  HCT and also has Jardiance ) Last urinary albumin/creatinine ratio:  Lab Results  Component Value Date   MICRALBCREAT Unable to calculate 09/16/2024   MICRALBCREAT 8.3 06/10/2024   MICRALBCREAT 0.9 07/02/2010   MICRALBCREAT 4.6 07/24/2008   MICRALBCREAT 25.0 02/27/2008   MICRALBCREAT 4.4 04/20/2007   MICRALBCREAT 6.3 01/09/2007   Last eye exam:  Lab Results  Component Value Date   HMDIABEYEEXA No Retinopathy 08/05/2024   Last foot exam: 09/16/2024 Tobacco Use:  Tobacco Use: Low Risk (10/29/2024)   Received from [facility]   Patient History    Smoking Tobacco Use: Never    Smokeless Tobacco Use: Never    Passive Exposure: Never    Objective:  Lab Results  Component  Value Date   HGBA1C 6.9 (H) 09/16/2024    Lab Results  Component Value Date   CREATININE 0.99 09/16/2024   BUN 20 09/16/2024   NA 137 09/16/2024   K 4.0 09/16/2024   CL 100 09/16/2024   CO2 28 09/16/2024    Lab Results  Component Value Date   CHOL 147 09/16/2024   HDL 48.50 09/16/2024   LDLCALC 74 09/16/2024   LDLDIRECT 103.6  08/23/2012   TRIG 123.0 09/16/2024   CHOLHDL 3 09/16/2024    Medications Reviewed Today     Reviewed by Carla Milling, RPH-CPP (Pharmacist) on 11/27/24 at 1317  Med List Status: <None>   Medication Order Taking? Sig Documenting Provider Last Dose Status Informant  Accu-Chek Softclix Lancets lancets [id number]  USE TO VERIFY BLOOD GLUCOSE WHEN SENSOR INDICATES [name], [name], DO  Active   Blood Glucose Monitoring Suppl (ACCU-CHEK GUIDE ME) w/Device KIT [id number] Yes Use to confirm blood glucose when sensor indicates and if blood glucose is changing quickyl [name], [name], DO  Active   Continuous Glucose Sensor (FREESTYLE LIBRE 3 PLUS SENSOR) [place] [id number] Yes CHANGE SENSOR EVERY 15 DAYS [name], [name], DO  Active   empagliflozin  (JARDIANCE ) 25 MG TABS tablet [id number] Yes Take 1 tablet (25 mg total) by mouth daily. [name], Jamee R, DO  Active   glucose blood (ACCU-CHEK GUIDE TEST) test strip [id number] Yes USE TO VERIFY BLOOD GLUCOSE WHEN SENSOR INDICATES [name], [name], DO  Active   LANTUS  SOLOSTAR 100 UNIT/ML Solostar Pen [id number] Yes Inject 15-20 Units into the skin at bedtime. Carla Milling, RPH-CPP  Active   lisinopril -hydrochlorothiazide  (ZESTORETIC ) 10-12.5 MG tablet [id number] Yes TAKE 1 TABLET BY MOUTH DAILY [name], [name], DO  Active   metFORMIN  (GLUCOPHAGE ) 1000 MG tablet [id number] Yes TAKE 1 TABLET BY MOUTH TWICE  DAILY WITH MEALS [name], [name], DO  Active   rosuvastatin  (CRESTOR ) 5 MG tablet [id number] Yes TAKE 1 TABLET BY MOUTH DAILY [name], [name], DO  Active   Semaglutide ,0.25 or 0.5MG /DOS, (OZEMPIC , 0.25 OR 0.5 MG/DOSE,) 2 MG/3ML SOPN [id number] Yes Inject 0.5 mg into the skin once a week. [name], Jamee R, DO  Active   Vitamin D , Ergocalciferol , (DRISDOL ) 1.25 MG (50000 UNIT) CAPS capsule [id number] Yes TAKE 1 CAPSULE BY MOUTH EVERY OTHER WEEK [name], [name], DO  Active               Assessment/Plan:    Diabetes: - Currently controlled but having lows at night.  goal A1c <7% - GMI for the last 14 days was 6.2%. Cardiorenal risk reduction is optimized.. Blood pressure is at goal <130/80 (2 of the last 3 readings were at goal). LDL is at goal.  - Reviewed long term cardiovascular and renal outcomes of uncontrolled blood sugar. and Reviewed goal A1c, goal fasting, and goal 2 hour post prandial glucose. Recommended to check glucose with CGM  - Recommended she lower dose of Lantus  dose by 5 more units to 10 units once a dya - Continue Ozempic  0.5mg  weekly.  - Continue Jardiance  25mg  daily and metformin  1000mg  twice a day  - Discussed 2026 medications cost. She has $355 deductible. Monthly cost of Ozempic  22% of cost  -estimate between $150 to $200 /monthJardiance  cost will be $135 / 90 days. Patient reports these cost are affordable and she is planning financially for her deducible. I suspect she will reach her $2100 out of pocket max  around July or August and cost will then be $0 copay for all meds.    Follow Up Plan:  3 months  Madelin Ray, PharmD Clinical Pharmacist [facility] [place] [facility]    "

## 2024-12-03 ENCOUNTER — Telehealth: Payer: Self-pay

## 2024-12-03 NOTE — Telephone Encounter (Signed)
 Copied from CRM [id number]. Topic: Clinical - Medication Question >> Dec 03, 2024  2:42 PM China J wrote: Reason for CRM: Patient was instructed to check back in with Tammy if she would like to increase her ozempic  dosage. Patient is needing to know if she can go up to 0.75 MG instead of 1.0 MG. She is also needing instruction on how to properly administer that specific dose. She also mentioned an increase in hunger.  Please call patient at [phone].

## 2024-12-05 ENCOUNTER — Encounter: Payer: Self-pay | Admitting: Pharmacist

## 2024-12-05 ENCOUNTER — Other Ambulatory Visit: Payer: Self-pay | Admitting: Family Medicine

## 2024-12-05 DIAGNOSIS — E119 Type 2 diabetes mellitus without complications: Secondary | ICD-10-CM

## 2024-12-05 NOTE — Telephone Encounter (Signed)
 Patient reports that she started Ozempic  1mg  weekly but had come nausea. She decreased back to 0.5mg  weekly but her hunger increased and he weight loss has stalled over the last few weeks.  Blood glucose has been well controlled.   Wt Readings from Last 3 Encounters:  11/27/24 180 lb (81.6 kg)  09/16/24 194 lb 9.6 oz (88.3 kg)  06/10/24 212 lb 9.6 oz (96.4 kg)   Discussed with patient that is can be more difficulty to lose weight during the winter months due to decreased activity. Encouraged her to try to fit in physical activity daily and to start a food log.  I also provided instructions on how to give 0.75mg  of Ozempic . She will use the 0.5mg  dose she has on hand - will inject 0.5mg  + 0.25mg  weekly. If she uses her 1mg  Ozempic  pen, she will count 55 clicks which equals about 0.75mg  weekly.   We also discussed the possibly changing to Mounjaro. The dose of Mounjaro on her current [facility] plan would be about $20 more per month. Patient would like to continue with Ozempic  a few more weeks and will increase physical activity and keep food diary to see if weight loss restarts.  Her personal goal weight is 175 lbs

## 2024-12-09 ENCOUNTER — Other Ambulatory Visit (HOSPITAL_BASED_OUTPATIENT_CLINIC_OR_DEPARTMENT_OTHER)

## 2024-12-09 ENCOUNTER — Ambulatory Visit (HOSPITAL_BASED_OUTPATIENT_CLINIC_OR_DEPARTMENT_OTHER)

## 2024-12-10 ENCOUNTER — Inpatient Hospital Stay (HOSPITAL_BASED_OUTPATIENT_CLINIC_OR_DEPARTMENT_OTHER): Admission: RE | Admit: 2024-12-10 | Source: Ambulatory Visit

## 2024-12-10 ENCOUNTER — Other Ambulatory Visit (HOSPITAL_BASED_OUTPATIENT_CLINIC_OR_DEPARTMENT_OTHER)

## 2025-02-19 ENCOUNTER — Other Ambulatory Visit

## 2025-03-17 ENCOUNTER — Ambulatory Visit: Admitting: Family Medicine
